# Patient Record
Sex: Male | Born: 1980 | Race: White | Hispanic: No | Smoking: Current some day smoker
Health system: Southern US, Community
[De-identification: ages and names within clinical notes are randomized; demographics above are authoritative.]

## PROBLEM LIST (undated history)

## (undated) DIAGNOSIS — F319 Bipolar disorder, unspecified: Secondary | ICD-10-CM

## (undated) DIAGNOSIS — F32A Depression, unspecified: Secondary | ICD-10-CM

## (undated) DIAGNOSIS — I10 Essential (primary) hypertension: Secondary | ICD-10-CM

## (undated) DIAGNOSIS — F419 Anxiety disorder, unspecified: Secondary | ICD-10-CM

## (undated) DIAGNOSIS — F1010 Alcohol abuse, uncomplicated: Secondary | ICD-10-CM

## (undated) DIAGNOSIS — T7840XA Allergy, unspecified, initial encounter: Secondary | ICD-10-CM

## (undated) DIAGNOSIS — Z91018 Allergy to other foods: Secondary | ICD-10-CM

## (undated) HISTORY — PX: BACK SURGERY: SHX140

## (undated) HISTORY — DX: Allergy, unspecified, initial encounter: T78.40XA

## (undated) HISTORY — DX: Allergy to other foods: Z91.018

## (undated) HISTORY — DX: Depression, unspecified: F32.A

---

## 2004-03-06 ENCOUNTER — Emergency Department: Payer: Self-pay | Admitting: Emergency Medicine

## 2004-05-07 ENCOUNTER — Emergency Department: Payer: Self-pay | Admitting: Emergency Medicine

## 2004-05-12 ENCOUNTER — Emergency Department: Payer: Self-pay | Admitting: Internal Medicine

## 2005-03-20 ENCOUNTER — Emergency Department: Payer: Self-pay | Admitting: Internal Medicine

## 2005-04-06 ENCOUNTER — Emergency Department: Payer: Self-pay | Admitting: General Practice

## 2005-11-08 ENCOUNTER — Ambulatory Visit (HOSPITAL_COMMUNITY): Admission: RE | Admit: 2005-11-08 | Discharge: 2005-11-09 | Payer: Self-pay | Admitting: Neurosurgery

## 2005-11-11 ENCOUNTER — Emergency Department: Payer: Self-pay | Admitting: Emergency Medicine

## 2005-11-15 ENCOUNTER — Emergency Department (HOSPITAL_COMMUNITY): Admission: EM | Admit: 2005-11-15 | Discharge: 2005-11-16 | Payer: Self-pay | Admitting: Emergency Medicine

## 2006-03-25 ENCOUNTER — Emergency Department: Payer: Self-pay | Admitting: Emergency Medicine

## 2006-04-23 HISTORY — PX: BACK SURGERY: SHX140

## 2006-05-20 ENCOUNTER — Emergency Department: Payer: Self-pay | Admitting: Emergency Medicine

## 2006-07-24 ENCOUNTER — Emergency Department: Payer: Self-pay | Admitting: Emergency Medicine

## 2006-11-09 ENCOUNTER — Emergency Department (HOSPITAL_COMMUNITY): Admission: EM | Admit: 2006-11-09 | Discharge: 2006-11-09 | Payer: Self-pay | Admitting: Emergency Medicine

## 2006-11-27 ENCOUNTER — Emergency Department: Payer: Self-pay

## 2006-12-26 ENCOUNTER — Emergency Department: Payer: Self-pay | Admitting: Emergency Medicine

## 2007-07-30 ENCOUNTER — Emergency Department: Payer: Self-pay | Admitting: Emergency Medicine

## 2007-09-25 ENCOUNTER — Emergency Department: Payer: Self-pay | Admitting: Internal Medicine

## 2007-09-27 ENCOUNTER — Emergency Department: Payer: Self-pay | Admitting: Emergency Medicine

## 2007-09-29 ENCOUNTER — Emergency Department: Payer: Self-pay | Admitting: Emergency Medicine

## 2008-09-14 ENCOUNTER — Emergency Department: Payer: Self-pay | Admitting: Emergency Medicine

## 2009-09-01 ENCOUNTER — Emergency Department: Payer: Self-pay | Admitting: Emergency Medicine

## 2009-09-08 ENCOUNTER — Emergency Department: Payer: Self-pay | Admitting: Emergency Medicine

## 2010-05-20 ENCOUNTER — Emergency Department: Payer: Self-pay | Admitting: Emergency Medicine

## 2010-05-31 ENCOUNTER — Emergency Department: Payer: Self-pay | Admitting: Emergency Medicine

## 2010-06-09 ENCOUNTER — Emergency Department: Payer: Self-pay | Admitting: Emergency Medicine

## 2010-07-12 ENCOUNTER — Emergency Department: Payer: Self-pay | Admitting: Internal Medicine

## 2010-07-15 ENCOUNTER — Emergency Department: Payer: Self-pay | Admitting: Emergency Medicine

## 2010-07-17 ENCOUNTER — Emergency Department: Payer: Self-pay | Admitting: Emergency Medicine

## 2010-09-08 NOTE — Op Note (Signed)
NAMESOVEREIGN, RAMIRO            ACCOUNT NO.:  [id number]   MEDICAL RECORD NO.:  [id number]          PATIENT TYPE:  OIB   LOCATION:  3033                         FACILITY:  [facility]   PHYSICIAN:  Reinaldo Meeker, M.D. DATE OF BIRTH:  1980-05-12   DATE OF PROCEDURE:  11/08/2005  DATE OF DISCHARGE:                                 OPERATIVE REPORT   PREOPERATIVE DIAGNOSIS:  Herniated disk, L4-5, left.   POSTOPERATIVE DIAGNOSIS:  Herniated disk, L4-5, left.   PROCEDURE:  Left L4-5 interlaminar laminotomy with excision of herniated  disk with operative microscope.   SECONDARY PROCEDURE:  Microdissection, L4-5 disk and L5 nerve root.   SURGEON:  Dr. Gerlene Fee   ASSISTANT:  Dr. Marikay Alar   PROCEDURE IN DETAIL:  After being placed in the prone position, the  patient's back was shaved, prepped, and draped in the usual sterile fashion.  Localizing x-ray was taken prior to incision to identify the appropriate  level.  Midline incision was made above the spinous processes of L4 and L5.  Using Bovie electrocautery current the incision was carried down the spinous  processes.  Subperiosteal dissection was then carried out along the left  side of the spinous processes and lamina and a self-retaining retractor was  placed for exposure.  X-ray showed approach at the appropriate level.  Using  the high-speed drill, the inferior one-third of the L4 lamina and the medial  one-third of the facet joint were removed.  Drill was then used to removed  the superior one-third of the L5 lamina.  Residual bone and ligamentum  flavum were removed in a piecemeal fashion.  The microscope was prepped,  brought into field, and used for the end of the case.  Using microdissection  technique, the lateral aspect of thecal sac and L5 nerve root were  identified.  Further coagulation was carried out down the front of the canal  to identify the L4-5 disk which was found to be diffusely herniated.  After  coagulating  down to the annulus, the annulus was incised with a #15 blade.  Using pituitary rongeurs and curettes a thorough disk-space clean out was  carried out, while at the same time, a great care was taken to avoid injury  to the nerve neural element, which was successfully done.  At this point,  inspection was carried out in all directions for any evidence of residual  compression and none could be identified.  Large amounts of irrigation were  carried out and any bleeding controlled by procoagulation Gelfoam.  The  wound was then closed in multiple layers of Vicryl on the muscle, fascia,  subcutaneous, and subcuticular tissues and staples were placed on the skin.  A sterile dressing was then applied and the patient was extubated and taken  to the recovery room in stable condition.           ______________________________  Reinaldo Meeker, M.D.     ROK/MEDQ  D:  11/08/2005  T:  11/09/2005  Job:  [id number]

## 2010-10-06 ENCOUNTER — Emergency Department: Payer: Self-pay | Admitting: Internal Medicine

## 2011-08-16 ENCOUNTER — Emergency Department: Payer: Self-pay | Admitting: Internal Medicine

## 2011-08-18 LAB — BETA STREP CULTURE(ARMC)

## 2011-09-26 ENCOUNTER — Emergency Department: Payer: Self-pay | Admitting: Emergency Medicine

## 2012-02-03 ENCOUNTER — Emergency Department: Payer: Self-pay | Admitting: Emergency Medicine

## 2012-02-26 ENCOUNTER — Emergency Department: Payer: Self-pay | Admitting: Emergency Medicine

## 2012-02-26 LAB — COMPREHENSIVE METABOLIC PANEL
Alkaline Phosphatase: 138 U/L — ABNORMAL HIGH (ref 50–136)
Anion Gap: 10 (ref 7–16)
Calcium, Total: 8.6 mg/dL (ref 8.5–10.1)
Co2: 26 mmol/L (ref 21–32)
EGFR (Non-African Amer.): >60
Osmolality: 277 (ref 275–301)
SGOT(AST): 45 U/L — ABNORMAL HIGH (ref 15–37)
Sodium: 139 mmol/L (ref 136–145)

## 2012-02-26 LAB — CBC
HGB: 15.5 g/dL (ref 13.0–18.0)
MCH: 32.2 pg (ref 26.0–34.0)
MCV: 92 fL (ref 80–100)
RBC: 4.82 10*6/uL (ref 4.40–5.90)

## 2012-02-26 LAB — URINALYSIS, COMPLETE
Bilirubin,UR: NEGATIVE
Blood: NEGATIVE
Ketone: NEGATIVE
Ph: 6 (ref 4.5–8.0)
Squamous Epithelial: NONE SEEN

## 2012-02-26 LAB — DRUG SCREEN, URINE
Barbiturates, Ur Screen: NEGATIVE (ref ?–200)
Cocaine Metabolite,Ur ~~LOC~~: NEGATIVE (ref ?–300)
Methadone, Ur Screen: NEGATIVE (ref ?–300)
Opiate, Ur Screen: NEGATIVE (ref ?–300)
Phencyclidine (PCP) Ur S: NEGATIVE (ref ?–25)

## 2012-02-26 LAB — ETHANOL: Ethanol %: 0.142 % — ABNORMAL HIGH (ref 0.000–0.080)

## 2012-03-29 LAB — DRUG SCREEN, URINE
Cannabinoid 50 Ng, Ur ~~LOC~~: NEGATIVE (ref ?–50)
Cocaine Metabolite,Ur ~~LOC~~: NEGATIVE (ref ?–300)
MDMA (Ecstasy)Ur Screen: NEGATIVE (ref ?–500)
Methadone, Ur Screen: NEGATIVE (ref ?–300)
Opiate, Ur Screen: NEGATIVE (ref ?–300)
Tricyclic, Ur Screen: NEGATIVE (ref ?–1000)

## 2012-03-29 LAB — COMPREHENSIVE METABOLIC PANEL
Albumin: 4.4 g/dL (ref 3.4–5.0)
Alkaline Phosphatase: 131 U/L (ref 50–136)
BUN: 10 mg/dL (ref 7–18)
Bilirubin,Total: 0.5 mg/dL (ref 0.2–1.0)
Chloride: 107 mmol/L (ref 98–107)
Creatinine: 0.83 mg/dL (ref 0.60–1.30)
Osmolality: 280 (ref 275–301)
Potassium: 3.8 mmol/L (ref 3.5–5.1)
Sodium: 140 mmol/L (ref 136–145)
Total Protein: 8.4 g/dL — ABNORMAL HIGH (ref 6.4–8.2)

## 2012-03-29 LAB — URINALYSIS, COMPLETE
Bilirubin,UR: NEGATIVE
Blood: NEGATIVE
Glucose,UR: NEGATIVE mg/dL (ref 0–75)
Ketone: NEGATIVE
Ph: 5 (ref 4.5–8.0)
Protein: NEGATIVE
Specific Gravity: 1.01 (ref 1.003–1.030)
Squamous Epithelial: NONE SEEN

## 2012-03-29 LAB — CBC
HGB: 15.5 g/dL (ref 13.0–18.0)
MCH: 30.5 pg (ref 26.0–34.0)
MCHC: 33.9 g/dL (ref 32.0–36.0)
MCV: 90 fL (ref 80–100)
RBC: 5.07 10*6/uL (ref 4.40–5.90)

## 2012-03-30 ENCOUNTER — Inpatient Hospital Stay: Payer: Self-pay | Admitting: Psychiatry

## 2012-08-14 ENCOUNTER — Emergency Department: Payer: Self-pay | Admitting: Emergency Medicine

## 2012-08-14 LAB — COMPREHENSIVE METABOLIC PANEL
Albumin: 4 g/dL (ref 3.4–5.0)
Anion Gap: 5 — ABNORMAL LOW (ref 7–16)
Bilirubin,Total: 0.3 mg/dL (ref 0.2–1.0)
Creatinine: 0.81 mg/dL (ref 0.60–1.30)
Glucose: 85 mg/dL (ref 65–99)
SGOT(AST): 36 U/L (ref 15–37)
Sodium: 142 mmol/L (ref 136–145)
Total Protein: 8.2 g/dL (ref 6.4–8.2)

## 2012-08-14 LAB — DRUG SCREEN, URINE
Cocaine Metabolite,Ur ~~LOC~~: NEGATIVE (ref ?–300)
MDMA (Ecstasy)Ur Screen: NEGATIVE (ref ?–500)
Methadone, Ur Screen: NEGATIVE (ref ?–300)
Opiate, Ur Screen: NEGATIVE (ref ?–300)
Phencyclidine (PCP) Ur S: NEGATIVE (ref ?–25)

## 2012-08-14 LAB — CBC
MCHC: 34.1 g/dL (ref 32.0–36.0)
RBC: 5.07 10*6/uL (ref 4.40–5.90)
RDW: 13.1 % (ref 11.5–14.5)
WBC: 9.5 10*3/uL (ref 3.8–10.6)

## 2012-08-14 LAB — ACETAMINOPHEN LEVEL: Acetaminophen: <2 ug/mL

## 2012-10-06 ENCOUNTER — Emergency Department: Payer: Self-pay | Admitting: Emergency Medicine

## 2012-12-07 ENCOUNTER — Emergency Department: Payer: Self-pay | Admitting: Emergency Medicine

## 2014-08-10 NOTE — Consult Note (Signed)
Brief Consult Note: Diagnosis: alcohol dependence.   Patient was seen by consultant.   Recommend further assessment or treatment.   Orders entered.   Comments: Psychiatry: Patient seen.34 year old man with long history of alcohol dependence and recent binging. He is here voluntarily seeking alcohol detox. Patient is awake, alert, calm and lucid and denies any suicidal ideation. No sign of psychosis. Good insight. Getting ativan for withdrawl and physically stable.  I added a one time dose of ativan 2mg  for him now as he looks a little shakey. NO NEED for IVC paperwork. Arrangements made for admmission to [facility] tomorrow. Sugggest he be DCed from the ER in the morning when he has  a ride to [place].  Electronic Signatures: Audery Amellapacs, Lateefah Mallery T (MD)  (Signed [phone]-Nov-13 18:39)  Authored: Brief Consult Note   Last Updated: 06-Nov-13 18:39 by Audery Amellapacs, Shrey Boike T (MD)

## 2014-08-10 NOTE — H&P (Signed)
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 The patient is single. He is divorced once. He is trying to get remarried to a 35 year old woman. The patient has two kids, one from marriage and one from a relationship, and he lives along with them with his parents who are in their 6850's. All five of them live in a five bedroom house. The patient comes for his first inpatient hospitalization on Psychiatry at [facility] with a chief complaint "I want to quit drinking. I'm tired of drinking".   HISTORY OF PRESENT ILLNESS: The patient reports that he has been drinking at a rate of 4 to 5 fifths of hard liquor along with 18 pack of beer in a span of a week and a half and has been drinking at this rate for the past six weeks or so. The patient is tired of drinking and wants to get help for the same.   PAST PSYCHIATRIC HISTORY: The patient had first inpatient hospitalization to [facility] about two months ago when he went to detox and stayed there for 20 days. Then he came out and stayed sober for three days and was very depressed because he had no job and he can't find a job and so he started drinking again as stated above. He has had one DWI and got his driver's license back. Never arrested for public drunkenness. Has had blackouts on several occasions. Never admitted to any mental hospital. No history of inpatient hospitalization on Psychiatry. No history of suicide attempts. Not being followed by any psychiatrist on an outpatient basis.   FAMILY HISTORY OF MENTAL ILLNESS: None known for mental illness. No history of suicides in the family.   FAMILY HISTORY: Raised by parents. Father is an Nature conservation officeroperations manager . Father is in his 6350's. Mother works at  [employer]. Mother is living and she is in her 2150's. He is an only child. Close to his parents.   PERSONAL HISTORY: [facility]. Dropped out in the 10th grade to go to work. Got GED later. No college. Joined the [employer] soon after coming out of high school. Was in the [employer]. [place] in [place] [employer] [place] Honorable discharge. Was called back for two years [facility]. 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   MARRIAGES: Married once. Marriage ended because she was a Magazine features editorwhore. Has a 27731-year-old kid from the marriage and he has custody of the 27731-year-old son. Has a 35-year-old son from a relationship and the patient has custody of him. Both of them live with the patient along with his parents.   ALCOHOL AND DRUGS: First drink of alcohol was at age 34 years. Became a problem soon after. Started drinking heavily as stated above recently because of feeling depressed about him not being able to get a job and not having a job. Has blackouts as stated above. Does admit smoking THC occasionally and last smoked it two months ago. Denies any IV drugs. Smokes one pack of cigarettes which lasts for 3 or 4 days.   MEDICAL HISTORY: No known high blood pressure. No known diabetes mellitus. Status post back  surgery at L4-L5 many years ago. No history of motor vehicle accident. Never been unconscious.   ALLERGIES: Allergic to Keflex and Mobic.  PRIMARY CARE PHYSICIAN: Not being followed by any physician at this time.   PHYSICAL EXAMINATION:   VITAL SIGNS: Temperature 98.2, pulse 76 per minute and regular, respirations   are...re regular, blood pressure 130/80 mmHg.   HEENT: Normocephalic and atraumatic. Pupils equal, round, and reactive to light and accommodation. Fundi bilaterally benign. EOMS visualized. Tympanic membranes visualized. No exudates.   CHEST: Normal expansion. Normal breath sounds heard.    HEART: Normal S1, S2 without any murmurs or gallops.   ABDOMEN: Soft. Normal bowel sounds heard.   RECTAL: Deferred.   NEUROLOGIC: Gait is normal. Romberg is negative. Cranial nerves II through XII grossly intact. Deep tendon reflexes 2+. Plantars normal response.   MENTAL STATUS EXAMINATION: The patient is dressed in street clothes. Alert and oriented to place, person, and time. Fully aware of situation that brought him for admission to [facility]. Affect is appropriate with his mood which is low and down and depressed about him not having a job and having to get over this habit of drinking alcohol. Admits feeling hopeless and helpless at times. Denies feeling worthless or useless. Denies any suicidal or homicidal plans. Does contract for safety. Wants to get help. No real psychosis. Denies auditory or visual hallucinations. Denies hearing voices or seeing things. Denies paranoid or suspicious ideas. Denies thought insertion or thought control. Denies having any grandiose ideas. Memory is intact. General knowledge and information is fair. He knew the current president and the name of the previous president. He knew capital of [address] and capital of [place]. He could spell the word world forward and backward without any problems. He could count money. Does admit to appetite and sleep disturbance when he's drinking alcohol. Insight and judgment guarded.   IMPRESSION: AXIS I: 1. Alcohol dependence, chronic, continuous with intoxication. 2. THC dependence. 3. Nicotine dependence. 4. Substance-induced mood disorder. 5. Adjustment disorder with depressed mood secondary to losing his job a month ago.  AXIS II: Deferred.   AXIS III: Status post back surgery L4-L5.   AXIS IV: Severe. Occupational, financial, and substance abuse problems.  AXIS V: GAF 25.  PLAN: The patient is admitted to [facility] for close observation, evaluation, and help. He will be started on CIWA  protocol. During the stay in the hospital, he will be given antidepressant medications. He will be taking part in milieu therapy and supportive counseling where substance abuse problems will be addressed.     Social Services will look into appropriate follow-up and substance abuse program if patient is interested.   ____________________________ Jannet Mantis. Guss Bunde, MD skc:drc D: 03/30/2012 18:16:21 ET T: 03/31/2012 05:47:25 ET JOB#: [id number]  cc: Monika Salk K. Guss Bunde, MD, <Dictator> Beau Fanny MD ELECTRONICALLY SIGNED 04/03/2012 7:31

## 2014-08-11 ENCOUNTER — Emergency Department
Admit: 2014-08-11 | Disposition: A | Discharge status: Home / Self Care | Payer: Self-pay | Admitting: Emergency Medicine

## 2015-03-24 ENCOUNTER — Emergency Department
Admission: EM | Admit: 2015-03-24 | Discharge: 2015-03-24 | Disposition: A | Discharge status: Home / Self Care | Payer: Self-pay | Attending: Emergency Medicine | Admitting: Emergency Medicine

## 2015-03-24 ENCOUNTER — Encounter: Payer: Self-pay | Admitting: Emergency Medicine

## 2015-03-24 DIAGNOSIS — F419 Anxiety disorder, unspecified: Secondary | ICD-10-CM | POA: Insufficient documentation

## 2015-03-24 DIAGNOSIS — F17200 Nicotine dependence, unspecified, uncomplicated: Secondary | ICD-10-CM | POA: Insufficient documentation

## 2015-03-24 DIAGNOSIS — X58XXXA Exposure to other specified factors, initial encounter: Secondary | ICD-10-CM | POA: Insufficient documentation

## 2015-03-24 DIAGNOSIS — Y9389 Activity, other specified: Secondary | ICD-10-CM | POA: Insufficient documentation

## 2015-03-24 DIAGNOSIS — T7840XA Allergy, unspecified, initial encounter: Secondary | ICD-10-CM | POA: Insufficient documentation

## 2015-03-24 DIAGNOSIS — Y9289 Other specified places as the place of occurrence of the external cause: Secondary | ICD-10-CM | POA: Insufficient documentation

## 2015-03-24 DIAGNOSIS — Y998 Other external cause status: Secondary | ICD-10-CM | POA: Insufficient documentation

## 2015-03-24 DIAGNOSIS — L500 Allergic urticaria: Secondary | ICD-10-CM | POA: Insufficient documentation

## 2015-03-24 DIAGNOSIS — R112 Nausea with vomiting, unspecified: Secondary | ICD-10-CM | POA: Insufficient documentation

## 2015-03-24 MED ORDER — PREDNISONE 20 MG PO TABS: 40.0000 mg | ORAL_TABLET | Freq: Every day | ORAL | Status: DC

## 2015-03-24 MED ORDER — EPINEPHRINE 0.3 MG/0.3ML IJ SOAJ: 0.3000 mg | Freq: Once | INTRAMUSCULAR | Status: DC

## 2015-03-24 MED ORDER — SODIUM CHLORIDE 0.9 % IV BOLUS (SEPSIS)
1000.0000 mL | Freq: Once | INTRAVENOUS | Status: AC
  Administered 2015-03-24: 1000 mL via INTRAVENOUS

## 2015-03-24 MED ORDER — METHYLPREDNISOLONE SODIUM SUCC 125 MG IJ SOLR
125.0000 mg | Freq: Once | INTRAMUSCULAR | Status: AC
  Administered 2015-03-24: 125 mg via INTRAVENOUS
  Filled 2015-03-24: qty 2

## 2015-03-24 MED ORDER — FAMOTIDINE IN NACL 20-0.9 MG/50ML-% IV SOLN
20.0000 mg | Freq: Once | INTRAVENOUS | Status: AC
  Administered 2015-03-24: 20 mg via INTRAVENOUS
  Filled 2015-03-24: qty 50

## 2015-03-24 MED ORDER — DIPHENHYDRAMINE HCL 50 MG/ML IJ SOLN
50.0000 mg | Freq: Once | INTRAMUSCULAR | Status: AC
  Administered 2015-03-24: 50 mg via INTRAVENOUS
  Filled 2015-03-24: qty 1

## 2015-03-24 MED ORDER — LORAZEPAM 0.5 MG PO TABS: 0.5000 mg | ORAL_TABLET | Freq: Three times a day (TID) | ORAL | Status: AC | PRN

## 2015-03-24 MED ORDER — ACETAMINOPHEN 325 MG PO TABS
ORAL_TABLET | ORAL | Status: AC
  Filled 2015-03-24: qty 2

## 2015-03-24 MED ORDER — ALPRAZOLAM 0.25 MG PO TABS: 0.2500 mg | ORAL_TABLET | Freq: Two times a day (BID) | ORAL | Status: AC | PRN

## 2015-03-24 NOTE — ED Notes (Signed)
Pt reports hives starting at 5:15am from unknown allergen.  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

## 2015-03-24 NOTE — ED Notes (Signed)
Pt states he is feeling better. Itching has decreased. No respiratory issues. Still has pink rash on thighs and bilateral flanks.

## 2015-03-24 NOTE — Discharge Instructions (Signed)
Allergies °An allergy is when your body reacts to a substance in a way that is not normal. An allergic reaction can happen after you: °· Eat something. °· Breathe in something. °· Touch something. °WHAT KINDS OF ALLERGIES ARE THERE? °You can be allergic to: °· Things that are only around during certain seasons, like molds and pollens. °· Foods. °· Drugs. °· Insects. °· Animal dander. °WHAT ARE SYMPTOMS OF ALLERGIES? °· Puffiness (swelling). This may happen on the lips, face, tongue, mouth, or throat. °· Sneezing. °· Coughing. °· Breathing loudly (wheezing). °· Stuffy nose. °· Tingling in the mouth. °· A rash. °· Itching. °· Itchy, red, puffy areas of skin (hives). °· Watery eyes. °· Throwing up (vomiting). °· Watery poop (diarrhea). °· Dizziness. °· Feeling faint or fainting. °· Trouble breathing or swallowing. °· A tight feeling in the chest. °· A fast heartbeat. °HOW ARE ALLERGIES DIAGNOSED? °Allergies can be diagnosed with: °· A medical and family history. °· Skin tests. °· Blood tests. °· A food diary. A food diary is a record of all the foods, drinks, and symptoms you have each day. °· The results of an elimination diet. This diet involves making sure not to eat certain foods and then seeing what happens when you start eating them again. °HOW ARE ALLERGIES TREATED? °There is no cure for allergies, but allergic reactions can be treated with medicine. Severe reactions usually need to be treated at a hospital.  °HOW CAN REACTIONS BE PREVENTED? °The best way to prevent an allergic reaction is to avoid the thing you are allergic to. Allergy shots and medicines can also help prevent reactions in some cases. °  °This information is not intended to replace advice given to you by your health care provider. Make sure you discuss any questions you have with your health care provider. °  °Document Released: 08/04/2012 Document Revised: 04/30/2014 Document Reviewed: 01/19/2014 °Elsevier Interactive Patient Education ©2016  Elsevier Inc. ° °

## 2015-03-24 NOTE — ED Provider Notes (Signed)
  Physical Exam  BP 140/94 mmHg  Pulse 78  Temp(Src) 97.7 F (36.5 C) (Oral)  Resp 17  Ht 5\' 9"  (1.753 m)  Wt 235 lb (106.595 kg)  BMI 34.69 kg/m2  SpO2 98%  Physical Exam  ED Course  Procedures  MDM Care assumed at 7 am. Patient woke up with hives. Not on new meds and no new shampoo or food. Had anaphylaxis to keflex before. Given solumedrol, benadryl, pepcid. Now rash much improved. No airway compromise. Prescription of steroids, epi pen prescribed by Dr. Lenard LancePaduchowski. Patient requests xanax for anxiety. Tachycardia resolved. Reassured patient.   Richardean Canalavid H Kha Hari, MD 03/24/15 [phone]

## 2015-03-24 NOTE — ED Notes (Signed)
Patient ambulatory to triage with steady gait, without difficulty or distress noted; pt reports awoke PTA with itching/hives with no known cause

## 2015-03-24 NOTE — ED Provider Notes (Signed)
[facility] Emergency Department Provider Note  Time seen: 6:09 AM  I have reviewed the triage vital signs and the nursing notes.   HISTORY  Chief Complaint Allergic Reaction    HPI Vernon Allen is a 34 y.o. male with no past medical history who presents the emergency department with an allergic reaction. According to the patient he went to sleep, and awoke with diffuse itching. He states it is gotten much worse over the past 30 minutes. States in the past he had an anaphylactic reaction in which his throat closed up due to a medication. Patient is scared this is happening again. Upon arrival to the emergency department patient had several episodes of vomiting. On exam the patient has diffuse hives, diffuse itching which she describes as severe. Denies any throat or mouth swelling. Denies any difficulty breathing.     History reviewed. No pertinent past medical history.  There are no active problems to display for this patient.   Past Surgical History  Procedure Laterality Date  . Back surgery      No current outpatient prescriptions on file.  Allergies Keflex and Mobic  No family history on file.  Social History Social History  Substance Use Topics  . Smoking status: Current Some Day Smoker  . Smokeless tobacco: None  . Alcohol Use: Yes    Review of Systems Constitutional: Negative for fever. Cardiovascular: Negative for chest pain. Respiratory: Negative for shortness of breath. Gastrointestinal: Negative for abdominal pain. Positive for nausea and vomiting 1. Musculoskeletal: Negative for back pain. Neurological: Negative for headache 10-point ROS otherwise negative.  ____________________________________________   PHYSICAL EXAM:  VITAL SIGNS: ED Triage Vitals  Enc Vitals Group     BP 03/24/15 0551 150/104 mmHg     Pulse Rate 03/24/15 0551 114     Resp 03/24/15 0551 20     Temp 03/24/15 0551 97.7 F (36.5 C)     Temp  Source 03/24/15 0551 Oral     SpO2 03/24/15 0551 97 %     Weight 03/24/15 0551 235 lb (106.595 kg)     Height 03/24/15 0551  (1.753 m)     Head Cir --      Peak Flow --      Pain Score --      Pain Loc --      Pain Edu? --      Excl. in GC? --     Constitutional: Alert and oriented. Mild distress due to anxiety and itching. Eyes: Normal exam ENT   Head: Normocephalic and atraumatic   Mouth/Throat: Mucous membranes are moist. No edema noted. Cardiovascular: Regular rhythm rate around 120 bpm. Respiratory: Normal respiratory effort without tachypnea nor retractions. Breath sounds are clear and equal bilaterally. No wheezes Gastrointestinal: Soft and nontender. No distention.   Musculoskeletal: Nontender with normal range of motion in all extremities Neurologic:  Normal speech and language.  Skin:  Diffuse hives over her entire body, extremities and trunk. Psychiatric: Anxious.  ____________________________________________     INITIAL IMPRESSION / ASSESSMENT AND PLAN / ED COURSE  Pertinent labs & imaging results that were available during my care of the patient were reviewed by me and considered in my medical decision making (see chart for details).  Patient presents for what appears to be a significant allergic reaction. No difficulty breathing, no oral swelling, no wheezes on exam. We will place an IV dose the patient Benadryl, Pepcid, Solu-Medrol, IV fluids, and closely monitor in the emergency  department. I discussed with the patient immediately notify us upon any signs of swelling or difficulty breathing.  Patient appears well, labs appear to be somewhat improved, patient more calm. We'll continue to closely monitor in the emergency department. Patient care signed out to Dr. Silverio LayYao. ____________________________________________   FINAL CLINICAL IMPRESSION(S) / ED DIAGNOSES  Allergic reaction   Minna AntisKevin Allure Greaser, MD 03/24/15 [phone]

## 2015-04-28 ENCOUNTER — Encounter: Payer: Self-pay | Admitting: Emergency Medicine

## 2015-04-28 ENCOUNTER — Emergency Department
Admission: EM | Admit: 2015-04-28 | Discharge: 2015-04-28 | Disposition: A | Discharge status: Home / Self Care | Payer: Self-pay | Attending: Emergency Medicine | Admitting: Emergency Medicine

## 2015-04-28 DIAGNOSIS — L509 Urticaria, unspecified: Secondary | ICD-10-CM

## 2015-04-28 DIAGNOSIS — T7840XA Allergy, unspecified, initial encounter: Secondary | ICD-10-CM | POA: Insufficient documentation

## 2015-04-28 DIAGNOSIS — F419 Anxiety disorder, unspecified: Secondary | ICD-10-CM | POA: Insufficient documentation

## 2015-04-28 DIAGNOSIS — T466X5A Adverse effect of antihyperlipidemic and antiarteriosclerotic drugs, initial encounter: Secondary | ICD-10-CM | POA: Insufficient documentation

## 2015-04-28 DIAGNOSIS — L500 Allergic urticaria: Secondary | ICD-10-CM | POA: Insufficient documentation

## 2015-04-28 DIAGNOSIS — F17210 Nicotine dependence, cigarettes, uncomplicated: Secondary | ICD-10-CM | POA: Insufficient documentation

## 2015-04-28 MED ORDER — PREDNISONE 20 MG PO TABS: ORAL_TABLET | ORAL | Status: DC

## 2015-04-28 MED ORDER — METHYLPREDNISOLONE SODIUM SUCC 125 MG IJ SOLR
125.0000 mg | Freq: Once | INTRAMUSCULAR | Status: AC
  Administered 2015-04-28: 125 mg via INTRAVENOUS
  Filled 2015-04-28: qty 2

## 2015-04-28 MED ORDER — DIPHENHYDRAMINE HCL 50 MG/ML IJ SOLN
25.0000 mg | Freq: Once | INTRAMUSCULAR | Status: AC
  Administered 2015-04-28: 25 mg via INTRAVENOUS
  Filled 2015-04-28: qty 1

## 2015-04-28 MED ORDER — FAMOTIDINE IN NACL 20-0.9 MG/50ML-% IV SOLN
20.0000 mg | Freq: Once | INTRAVENOUS | Status: AC
  Administered 2015-04-28: 20 mg via INTRAVENOUS
  Filled 2015-04-28: qty 50

## 2015-04-28 MED ORDER — EPINEPHRINE 0.3 MG/0.3ML IJ SOAJ: 0.3000 mg | Freq: Once | INTRAMUSCULAR | Status: DC

## 2015-04-28 MED ORDER — FAMOTIDINE 20 MG PO TABS: 20.0000 mg | ORAL_TABLET | Freq: Two times a day (BID) | ORAL | Status: DC

## 2015-04-28 NOTE — ED Provider Notes (Signed)
At 9:30 I checked on the patient is having no symptoms. No hives. No trouble breathing.  Patient states that the anxiety is present significantly*states, however I discussed within the eye and not prescribed and today's pains. 4 anxiety, I have asked him to follow up with [facility].  In terms of his allergic reaction, he was referred to ENT for possible allergy testing, but states he probably will be out of for that.  I've [facility]  He also states he could not afford the $500 EpiPen prescription. I have prescribed the generic epinephrine autoinjector, and printed out a coupon for $100 off from [facility].  We discussed return precautions.  Patient was discharged home.  Governor Rooksebecca Sanjeev Main, MD 04/28/15 [phone]

## 2015-04-28 NOTE — ED Notes (Signed)
Pt resting in bed, resp even and unlabored, in no acute distress

## 2015-04-28 NOTE — Discharge Instructions (Signed)
1. Take the following medicines for the next 4 days: Prednisone 60mg  daily Pepcid 20mg  twice daily 2. Take Benadryl as needed for itching. 3. Return to the ER for worsening symptoms, persistent vomiting, difficulty breathing or other concerns.  Allergies An allergy is an abnormal reaction to a substance by the body's defense system (immune system). Allergies can develop at any age. WHAT CAUSES ALLERGIES? An allergic reaction happens when the immune system mistakenly reacts to a normally harmless substance, called an allergen, as if it were harmful. The immune system releases antibodies to fight the substance. Antibodies eventually release a chemical called histamine into the bloodstream. The release of histamine is meant to protect the body from infection, but it also causes discomfort. An allergic reaction can be triggered by:  Eating an allergen.  Inhaling an allergen.  Touching an allergen. WHAT TYPES OF ALLERGIES ARE THERE? There are many types of allergies. Common types include:  Seasonal allergies. People with this type of allergy are usually allergic to substances that are only present during certain seasons, such as molds and pollens.  Food allergies.  Drug allergies.  Insect allergies.  Animal dander allergies. WHAT ARE SYMPTOMS OF ALLERGIES? Possible allergy symptoms include:  Swelling of the lips, face, tongue, mouth, or throat.  Sneezing, coughing, or wheezing.  Nasal congestion.  Tingling in the mouth.  Rash.  Itching.  Itchy, red, swollen areas of skin (hives).  Watery eyes.  Vomiting.  Diarrhea.  Dizziness.  Lightheadedness.  Fainting.  Trouble breathing or swallowing.  Chest tightness.  Rapid heartbeat. HOW ARE ALLERGIES DIAGNOSED? Allergies are diagnosed with a medical and family history and one or more of the following:  Skin tests.  Blood tests.  A food diary. A food diary is a record of all the foods and drinks you have in a  day and of all the symptoms you experience.  The results of an elimination diet. An elimination diet involves eliminating foods from your diet and then adding them back in one by one to find out if a certain food causes an allergic reaction. HOW ARE ALLERGIES TREATED? There is no cure for allergies, but allergic reactions can be treated with medicine. Severe reactions usually need to be treated at a hospital. HOW CAN REACTIONS BE PREVENTED? The best way to prevent an allergic reaction is by avoiding the substance you are allergic to. Allergy shots and medicines can also help prevent reactions in some cases. People with severe allergic reactions may be able to prevent a life-threatening reaction called anaphylaxis with a medicine given right after exposure to the allergen.   This information is not intended to replace advice given to you by your health care provider. Make sure you discuss any questions you have with your health care provider.   Document Released: 07/03/2002 Document Revised: 04/30/2014 Document Reviewed: 01/19/2014 Elsevier Interactive Patient Education 2016 Elsevier Inc.  Hives Hives are itchy, red, swollen areas of the skin. They can vary in size and location on your body. Hives can come and go for hours or several days (acute hives) or for several weeks (chronic hives). Hives do not spread from person to person (noncontagious). They may get worse with scratching, exercise, and emotional stress. CAUSES   Allergic reaction to food, additives, or drugs.  Infections, including the common cold.  Illness, such as vasculitis, lupus, or thyroid disease.  Exposure to sunlight, heat, or cold.  Exercise.  Stress.  Contact with chemicals. SYMPTOMS   Red or white swollen patches on  the skin. The patches may change size, shape, and location quickly and repeatedly.  Itching.  Swelling of the hands, feet, and face. This may occur if hives develop deeper in the skin. DIAGNOSIS   Your caregiver can usually tell what is wrong by performing a physical exam. Skin or blood tests may also be done to determine the cause of your hives. In some cases, the cause cannot be determined. TREATMENT  Mild cases usually get better with medicines such as antihistamines. Severe cases may require an emergency epinephrine injection. If the cause of your hives is known, treatment includes avoiding that trigger.  HOME CARE INSTRUCTIONS   Avoid causes that trigger your hives.  Take antihistamines as directed by your caregiver to reduce the severity of your hives. Non-sedating or low-sedating antihistamines are usually recommended. Do not drive while taking an antihistamine.  Take any other medicines prescribed for itching as directed by your caregiver.  Wear loose-fitting clothing.  Keep all follow-up appointments as directed by your caregiver. SEEK MEDICAL CARE IF:   You have persistent or severe itching that is not relieved with medicine.  You have painful or swollen joints. SEEK IMMEDIATE MEDICAL CARE IF:   You have a fever.  Your tongue or lips are swollen.  You have trouble breathing or swallowing.  You feel tightness in the throat or chest.  You have abdominal pain. These problems may be the first sign of a life-threatening allergic reaction. Call your local emergency services (911 in [place] MAKE SURE YOU:   Understand these instructions.  Will watch your condition.  Will get help right away if you are not doing well or get worse.   This information is not intended to replace advice given to you by your health care provider. Make sure you discuss any questions you have with your health care provider.   Document Released: 04/09/2005 Document Revised: 04/14/2013 Document Reviewed: 07/03/2011 Elsevier Interactive Patient Education [employer] Elsevier Inc.

## 2015-04-28 NOTE — ED Provider Notes (Signed)
[facility] Emergency Department Provider Note  ____________________________________________  Time seen: Approximately 5:31 AM  I have reviewed the triage vital signs and the nursing notes.   HISTORY  Chief Complaint Allergic Reaction    HPI Vernon Allen is a 35 y.o. male who presents to the ED from home via EMS with a chief complaint of allergic reaction. Patient has a history of similar, most recently approximately 5 weeks ago. Patient states he does not have the money to follow up with an allergist nor to a for an EpiPen. Reports waking up at 4 AM with difficulty breathing and hives on his extremities and trunk. Denies new exposures such as medicines, foods, detergents, soap. Ate acute steak for dinner last evening. Of note, patient also ate red meats last month prior to his allergic reaction. He did have a tick exposure this past summer. Patient denies fever, chills, chest pain, shortness of breath, diaphoresis, nausea, vomiting, diarrhea.Denies recent travel or trauma.   Past medical history None  There are no active problems to display for this patient.   Past Surgical History  Procedure Laterality Date  . Back surgery      Current Outpatient Rx  Name  Route  Sig  Dispense  Refill  . ALPRAZolam (XANAX) 0.25 MG tablet   Oral   Take 1 tablet (0.25 mg total) by mouth 2 (two) times daily as needed for anxiety.   6 tablet   0   . EPINEPHrine (EPIPEN 2-PAK) 0.3 mg/0.3 mL IJ SOAJ injection   Intramuscular   Inject 0.3 mLs (0.3 mg total) into the muscle once.   1 Device   0   . LORazepam (ATIVAN) 0.5 MG tablet   Oral   Take 1 tablet (0.5 mg total) by mouth every 8 (eight) hours as needed for anxiety.   10 tablet   0   . predniSONE (DELTASONE) 20 MG tablet   Oral   Take 2 tablets (40 mg total) by mouth daily.   10 tablet   0     Allergies Keflex and Mobic  No family history on file.  Social History Social History  Substance  Use Topics  . Smoking status: Current Some Day Smoker    Types: Cigarettes  . Smokeless tobacco: None  . Alcohol Use: Yes    Review of Systems Constitutional: No fever/chills Eyes: No visual changes. ENT: No sore throat. Cardiovascular: Denies chest pain. Respiratory: Denies shortness of breath. Gastrointestinal: No abdominal pain.  No nausea, no vomiting.  No diarrhea.  No constipation. Genitourinary: Negative for dysuria. Musculoskeletal: Negative for back pain. Skin: Positive for rash. Neurological: Negative for headaches, focal weakness or numbness.  10-point ROS otherwise negative.  ____________________________________________   PHYSICAL EXAM:  VITAL SIGNS: ED Triage Vitals  Enc Vitals Group     BP --      Pulse --      Resp --      Temp --      Temp src --      SpO2 --      Weight --      Height --      Head Cir --      Peak Flow --      Pain Score --      Pain Loc --      Pain Edu? --      Excl. in GC? --     Constitutional: Alert and oriented. Well appearing and in mild acute distress.  Mildly anxious. Eyes: Conjunctivae are normal. PERRL. EOMI. Head: Atraumatic. Nose: No congestion/rhinnorhea. Mouth/Throat: Mucous membranes are moist.  Oropharynx non-erythematous.  There is no facial, lip or tongue swelling. There is no muffled or hoarse voice. Neck: No stridor.   Cardiovascular: Normal rate, regular rhythm. Grossly normal heart sounds.  Good peripheral circulation. Respiratory: Normal respiratory effort.  No retractions. Lungs CTAB. Gastrointestinal: Soft and nontender. No distention. No abdominal bruits. No CVA tenderness. Musculoskeletal: No lower extremity tenderness nor edema.  No joint effusions. Neurologic:  Normal speech and language. No gross focal neurologic deficits are appreciated. No gait instability. Skin:  Skin is warm, dry and intact. Urticaria noted to extremities and trunk. No petechiae. Psychiatric: Mood and affect are normal. Speech  and behavior are normal.  ____________________________________________   LABS (all labs ordered are listed, but only abnormal results are displayed)  Labs Reviewed - No data to display ____________________________________________  EKG  None ____________________________________________  RADIOLOGY  None ____________________________________________   PROCEDURES  Procedure(s) performed: None  Critical Care performed: No  ____________________________________________   INITIAL IMPRESSION / ASSESSMENT AND PLAN / ED COURSE  Pertinent labs & imaging results that were available during my care of the patient were reviewed by me and considered in my medical decision making (see chart for details).  35 year old male with acute allergic reaction, urticaria. No angioedema or airway distress. Patient took 12.5 mg liquid Benadryl and a Xanax prior to arrival. Will initiate IV allergic reaction cocktail including Solu-Medrol, Benadryl and Pepcid. Will monitor for 3-4 hours.  ----------------------------------------- 7:03 AM on 04/28/2015 -----------------------------------------  Patient resting in no acute distress. Hives improving. Room air saturations 98%. There remains no angioedema. Will proceed with plan to observe for an additional 2 hours. Anticipate discharge home at that time if symptoms continue to improve. Care transferred to Dr. Shaune Pollack pending reassessment. ____________________________________________   FINAL CLINICAL IMPRESSION(S) / ED DIAGNOSES  Final diagnoses:  Allergic reaction, initial encounter  Hives  Anxiety      Vernon Hong, MD 04/28/15 [phone]

## 2015-04-28 NOTE — ED Notes (Addendum)
Pt presents to ED via [facility] from home c/o allergic reaction. Pt states woke up this morning at 4:00 am with difficulty breathing and hives on back and extremities, Pt states has not changed detergents, food, or soap. Pt took 12.5 mg Benadryl at 4:00 am and Xanax at 5:00 am\. Pt reports was seen here in Dec for same symptoms but states has been unable to see allergist, states "I can't afford to see the allergist and I cant afford the Epipen either, so I just keep Benadryl on me." Pt denies trouble breathing at this time, pt denies chest pain, diaphoresis, or other complaints at this time. Pt alert and oriented x 4, respirations even and unlabored, skin warm and dry. MD at bedside.

## 2017-05-24 ENCOUNTER — Other Ambulatory Visit: Payer: Self-pay

## 2017-05-24 ENCOUNTER — Emergency Department
Admission: EM | Admit: 2017-05-24 | Discharge: 2017-05-24 | Disposition: A | Discharge status: Home / Self Care | Payer: Self-pay | Attending: Emergency Medicine | Admitting: Emergency Medicine

## 2017-05-24 ENCOUNTER — Encounter: Payer: Self-pay | Admitting: Emergency Medicine

## 2017-05-24 DIAGNOSIS — F17210 Nicotine dependence, cigarettes, uncomplicated: Secondary | ICD-10-CM | POA: Insufficient documentation

## 2017-05-24 DIAGNOSIS — J209 Acute bronchitis, unspecified: Secondary | ICD-10-CM | POA: Insufficient documentation

## 2017-05-24 MED ORDER — AZITHROMYCIN 250 MG PO TABS: ORAL_TABLET | ORAL | 0 refills | Status: DC

## 2017-05-24 MED ORDER — SERTRALINE HCL 50 MG PO TABS: 50.0000 mg | ORAL_TABLET | Freq: Every day | ORAL | 3 refills | Status: DC

## 2017-05-24 MED ORDER — IPRATROPIUM-ALBUTEROL 0.5-2.5 (3) MG/3ML IN SOLN: 3.0000 mL | Freq: Four times a day (QID) | RESPIRATORY_TRACT | 0 refills | Status: DC | PRN

## 2017-05-24 MED ORDER — IPRATROPIUM-ALBUTEROL 0.5-2.5 (3) MG/3ML IN SOLN
3.0000 mL | Freq: Once | RESPIRATORY_TRACT | Status: AC
  Administered 2017-05-24: 3 mL via RESPIRATORY_TRACT
  Filled 2017-05-24: qty 3

## 2017-05-24 NOTE — BH Assessment (Signed)
Per request of ER MD (Fisher), writer provided the pt. with information and instructions on how to access [facility] ([facility])   Patient denies SI/HI and AV/H.    [facility] [address],  [place], [place] [address] [phone]

## 2017-05-24 NOTE — ED Notes (Signed)
Spoke with TTS, advised of consult order.

## 2017-05-24 NOTE — Discharge Instructions (Signed)
Follow-up with your regular doctor or the acute care if your bronchitis is not better in 3-5 days.  Return to emergency department if you are worsening.  For the anxiety and posttraumatic stress syndrome please follow-up with [facility].  He should call and make an appointment.  You have also been given a prescription for Zoloft to help with your anxiety.  If you are becoming suicidal or homicidal please return to emergency department as soon as possible
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
## 2017-05-24 NOTE — ED Notes (Signed)
TTS to bedside at this time. 

## 2018-07-04 ENCOUNTER — Other Ambulatory Visit: Payer: Self-pay

## 2018-07-04 ENCOUNTER — Emergency Department
Admission: EM | Admit: 2018-07-04 | Discharge: 2018-07-04 | Disposition: A | Discharge status: Home / Self Care | Payer: Self-pay | Attending: Emergency Medicine | Admitting: Emergency Medicine

## 2018-07-04 DIAGNOSIS — R6883 Chills (without fever): Secondary | ICD-10-CM | POA: Insufficient documentation

## 2018-07-04 DIAGNOSIS — R05 Cough: Secondary | ICD-10-CM | POA: Insufficient documentation

## 2018-07-04 DIAGNOSIS — R69 Illness, unspecified: Secondary | ICD-10-CM

## 2018-07-04 DIAGNOSIS — J111 Influenza due to unidentified influenza virus with other respiratory manifestations: Secondary | ICD-10-CM

## 2018-07-04 DIAGNOSIS — R52 Pain, unspecified: Secondary | ICD-10-CM

## 2018-07-04 DIAGNOSIS — F17210 Nicotine dependence, cigarettes, uncomplicated: Secondary | ICD-10-CM | POA: Insufficient documentation

## 2018-07-04 DIAGNOSIS — M791 Myalgia, unspecified site: Secondary | ICD-10-CM | POA: Insufficient documentation

## 2018-07-04 MED ORDER — PSEUDOEPH-BROMPHEN-DM 30-2-10 MG/5ML PO SYRP: 5.0000 mL | ORAL_SOLUTION | Freq: Four times a day (QID) | ORAL | 0 refills | Status: DC | PRN

## 2018-07-04 NOTE — ED Notes (Signed)
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

## 2018-07-04 NOTE — ED Triage Notes (Signed)
Since Wednesday cold chills and body aches. Denies cough. States nasal congestion. A&O, ambulatory. No distress noted.

## 2018-07-04 NOTE — ED Provider Notes (Signed)
[facility] Emergency Department Provider Note   ____________________________________________   First MD Initiated Contact with Patient 07/04/18 0840     (approximate)  I have reviewed the triage vital signs and the nursing notes.   HISTORY  Chief Complaint Generalized Body Aches    HPI Vernon Allen is a 38 y.o. male patient presents with 2 days of body aches.  Patient denies cough.  Patient state nasal congestion and runny nose.  Patient denies nausea, vomiting, diarrhea.  Patient had taken flu shot for this season.  Patient rates his pain as a 2/10.  Patient rates the pain as a 2/10.  Patient described pain is "sore".  No palliative measure for complaint.    History reviewed. No pertinent past medical history.  There are no active problems to display for this patient.   Past Surgical History:  Procedure Laterality Date  . BACK SURGERY      Prior to Admission medications   Medication Sig Start Date End Date Taking? Authorizing Provider  brompheniramine-pseudoephedrine-DM 30-2-10 MG/5ML syrup Take 5 mLs by mouth 4 (four) times daily as needed. 07/04/18   Joni Reining, PA-C  EPINEPHrine 0.3 mg/0.3 mL IJ SOAJ injection Inject 0.3 mLs (0.3 mg total) into the muscle once. 04/28/15   Governor Rooks, MD  ipratropium-albuterol (DUONEB) 0.5-2.5 (3) MG/3ML SOLN Take 3 mLs by nebulization every 6 (six) hours as needed. 05/24/17   Fisher, Roselyn Bering, PA-C  sertraline (ZOLOFT) 50 MG tablet Take 1 tablet (50 mg total) by mouth daily. 05/24/17   Faythe Ghee, PA-C    Allergies Keflex [cephalexin] and Mobic [meloxicam]  History reviewed. No pertinent family history.  Social History Social History   Tobacco Use  . Smoking status: Current Some Day Smoker    Types: Cigarettes  . Smokeless tobacco: Never Used  Substance Use Topics  . Alcohol use: Yes  . Drug use: Yes    Types: Marijuana    Review of Systems Constitutional: No fever/chills.  Body  aches. Eyes: No visual changes. ENT: No sore throat.  Nasal congestion. Cardiovascular: Denies chest pain. Respiratory: Denies shortness of breath. Gastrointestinal: No abdominal pain.  No nausea, no vomiting.  No diarrhea.  No constipation. Genitourinary: Negative for dysuria. Musculoskeletal: Negative for back pain. Skin: Negative for rash. Neurological: Negative for headaches, focal weakness or numbness. Allergic/Immunilogical: Keflex and meloxicam ____________________________________________   PHYSICAL EXAM:  VITAL SIGNS: ED Triage Vitals  Enc Vitals Group     BP 07/04/18 0834 (!) 162/99     Pulse Rate 07/04/18 0834 87     Resp --      Temp 07/04/18 0834 98.6 F (37 C)     Temp Source 07/04/18 0834 Oral     SpO2 07/04/18 0834 97 %     Weight 07/04/18 0835 205 lb (93 kg)     Height 07/04/18 0835 5\' 10"  (1.778 m)     Head Circumference --      Peak Flow --      Pain Score 07/04/18 0836 2     Pain Loc --      Pain Edu? --      Excl. in GC? --    Constitutional: Alert and oriented. Well appearing and in no acute distress. Eyes: Conjunctivae are normal. PERRL. EOMI. Head: Atraumatic. Nose: Edematous nasal turbinates clear rhinorrhea. Mouth/Throat: Mucous membranes are moist.  Oropharynx non-erythematous.  Postnasal drainage. Neck: No stridor.   Hematological/Lymphatic/Immunilogical: No cervical lymphadenopathy. Cardiovascular: Normal rate, regular rhythm.  Grossly normal heart sounds.  Good peripheral circulation.  Elevated blood pressure. Respiratory: Normal respiratory effort.  No retractions. Lungs CTAB. Gastrointestinal: Soft and nontender. No distention. No abdominal bruits. No CVA tenderness. Skin:  Skin is warm, dry and intact. No rash noted. Psychiatric: Mood and affect are normal. Speech and behavior are normal.  ____________________________________________   LABS (all labs ordered are listed, but only abnormal results are displayed)  Labs Reviewed - No  data to display ____________________________________________  EKG   ____________________________________________  RADIOLOGY  ED MD interpretation:    Official radiology report(s): No results found.  ____________________________________________   PROCEDURES  Procedure(s) performed (including Critical Care):  Procedures   ____________________________________________   INITIAL IMPRESSION / ASSESSMENT AND PLAN / ED COURSE  As part of my medical decision making, I reviewed the following data within the electronic MEDICAL RECORD NUMBER         Patient presents with cough, chills, and body aches.  Patient physical exam is consistent with viral flulike illness.  Patient given discharge care instruction advised take medication as directed.  Patient given work note for 2 days.      ____________________________________________   FINAL CLINICAL IMPRESSION(S) / ED DIAGNOSES  Final diagnoses:  Influenza-like illness  Body aches     ED Discharge Orders         Ordered    brompheniramine-pseudoephedrine-DM 30-2-10 MG/5ML syrup  4 times daily PRN     07/04/18 0913           Note:  This document was prepared using Dragon voice recognition software and may include unintentional dictation errors.    Joni Reining, PA-C 07/04/18 1542    Jene Every, MD 07/05/18 [phone]

## 2018-08-28 ENCOUNTER — Emergency Department: Payer: Self-pay

## 2018-08-28 ENCOUNTER — Encounter: Payer: Self-pay | Admitting: Emergency Medicine

## 2018-08-28 ENCOUNTER — Inpatient Hospital Stay
Admission: EM | Admit: 2018-08-28 | Discharge: 2018-09-04 | DRG: 373 | Disposition: A | Discharge status: Home / Self Care | Payer: Self-pay | Attending: Surgery | Admitting: Surgery

## 2018-08-28 ENCOUNTER — Other Ambulatory Visit: Payer: Self-pay

## 2018-08-28 DIAGNOSIS — K3532 Acute appendicitis with perforation and localized peritonitis, without abscess: Secondary | ICD-10-CM | POA: Diagnosis present

## 2018-08-28 DIAGNOSIS — Z881 Allergy status to other antibiotic agents status: Secondary | ICD-10-CM

## 2018-08-28 DIAGNOSIS — R1013 Epigastric pain: Secondary | ICD-10-CM

## 2018-08-28 DIAGNOSIS — K651 Peritoneal abscess: Secondary | ICD-10-CM

## 2018-08-28 DIAGNOSIS — K3533 Acute appendicitis with perforation and localized peritonitis, with abscess: Principal | ICD-10-CM | POA: Diagnosis present

## 2018-08-28 DIAGNOSIS — K3580 Unspecified acute appendicitis: Secondary | ICD-10-CM

## 2018-08-28 DIAGNOSIS — Z888 Allergy status to other drugs, medicaments and biological substances status: Secondary | ICD-10-CM

## 2018-08-28 DIAGNOSIS — Z1159 Encounter for screening for other viral diseases: Secondary | ICD-10-CM

## 2018-08-28 DIAGNOSIS — Z79899 Other long term (current) drug therapy: Secondary | ICD-10-CM

## 2018-08-28 DIAGNOSIS — Z91018 Allergy to other foods: Secondary | ICD-10-CM

## 2018-08-28 DIAGNOSIS — F17210 Nicotine dependence, cigarettes, uncomplicated: Secondary | ICD-10-CM | POA: Diagnosis present

## 2018-08-28 LAB — CBC WITH DIFFERENTIAL/PLATELET
Abs Immature Granulocytes: 0.23 10*3/uL — ABNORMAL HIGH (ref 0.00–0.07)
Basophils Absolute: 0.1 10*3/uL (ref 0.0–0.1)
Basophils Relative: 1 %
Eosinophils Absolute: 0.2 10*3/uL (ref 0.0–0.5)
Eosinophils Relative: 1 %
HCT: 44.8 % (ref 39.0–52.0)
Hemoglobin: 14.9 g/dL (ref 13.0–17.0)
Immature Granulocytes: 1 %
Lymphocytes Relative: 7 %
Lymphs Abs: 1.6 10*3/uL (ref 0.7–4.0)
MCH: 31 pg (ref 26.0–34.0)
MCHC: 33.3 g/dL (ref 30.0–36.0)
MCV: 93.3 fL (ref 80.0–100.0)
Monocytes Absolute: 2.7 10*3/uL — ABNORMAL HIGH (ref 0.1–1.0)
Monocytes Relative: 13 %
Neutro Abs: 16.8 10*3/uL — ABNORMAL HIGH (ref 1.7–7.7)
Neutrophils Relative %: 77 %
Platelets: 347 10*3/uL (ref 150–400)
RBC: 4.8 MIL/uL (ref 4.22–5.81)
RDW: 11.6 % (ref 11.5–15.5)
WBC: 21.7 10*3/uL — ABNORMAL HIGH (ref 4.0–10.5)
nRBC: 0 % (ref 0.0–0.2)

## 2018-08-28 LAB — COMPREHENSIVE METABOLIC PANEL
ALT: 11 U/L (ref 0–44)
AST: 17 U/L (ref 15–41)
Albumin: 3.8 g/dL (ref 3.5–5.0)
Alkaline Phosphatase: 104 U/L (ref 38–126)
Anion gap: 12 (ref 5–15)
BUN: 13 mg/dL (ref 6–20)
CO2: 25 mmol/L (ref 22–32)
Calcium: 9.5 mg/dL (ref 8.9–10.3)
Chloride: 97 mmol/L — ABNORMAL LOW (ref 98–111)
Creatinine, Ser: 0.8 mg/dL (ref 0.61–1.24)
GFR calc Af Amer: >60 mL/min (ref >60)
GFR calc non Af Amer: >60 mL/min (ref >60)
Glucose, Bld: 131 mg/dL — ABNORMAL HIGH (ref 70–99)
Potassium: 3.6 mmol/L (ref 3.5–5.1)
Sodium: 134 mmol/L — ABNORMAL LOW (ref 135–145)
Total Bilirubin: 0.9 mg/dL (ref 0.3–1.2)
Total Protein: 8.8 g/dL — ABNORMAL HIGH (ref 6.5–8.1)

## 2018-08-28 LAB — LIPASE, BLOOD: Lipase: 21 U/L (ref 11–51)

## 2018-08-28 LAB — ABO/RH: ABO/RH(D): O NEG

## 2018-08-28 LAB — SARS CORONAVIRUS 2 BY RT PCR (HOSPITAL ORDER, PERFORMED IN ~~LOC~~ HOSPITAL LAB): SARS Coronavirus 2: NEGATIVE

## 2018-08-28 MED ORDER — CIPROFLOXACIN IN D5W 400 MG/200ML IV SOLN
400.0000 mg | Freq: Two times a day (BID) | INTRAVENOUS | Status: DC
  Filled 2018-08-28 (×2): qty 200

## 2018-08-28 MED ORDER — ONDANSETRON HCL 4 MG/2ML IJ SOLN
4.0000 mg | Freq: Once | INTRAMUSCULAR | Status: AC
  Administered 2018-08-28: 4 mg via INTRAVENOUS
  Filled 2018-08-28: qty 2

## 2018-08-28 MED ORDER — SODIUM CHLORIDE 0.9 % IV SOLN
1.0000 g | Freq: Three times a day (TID) | INTRAVENOUS | Status: DC
  Administered 2018-08-29 – 2018-09-04 (×20): 1 g via INTRAVENOUS
  Filled 2018-08-28 (×22): qty 1

## 2018-08-28 MED ORDER — ONDANSETRON HCL 4 MG/2ML IJ SOLN: 4.0000 mg | Freq: Four times a day (QID) | INTRAMUSCULAR | Status: DC | PRN

## 2018-08-28 MED ORDER — ACETAMINOPHEN 500 MG PO TABS
1000.0000 mg | ORAL_TABLET | Freq: Four times a day (QID) | ORAL | Status: DC
  Administered 2018-08-28 – 2018-08-31 (×10): 1000 mg via ORAL
  Filled 2018-08-28 (×10): qty 2

## 2018-08-28 MED ORDER — METRONIDAZOLE IN NACL 5-0.79 MG/ML-% IV SOLN
500.0000 mg | Freq: Three times a day (TID) | INTRAVENOUS | Status: DC
  Administered 2018-08-28: 500 mg via INTRAVENOUS
  Filled 2018-08-28 (×3): qty 100

## 2018-08-28 MED ORDER — DEXTROSE-NACL 5-0.9 % IV SOLN
INTRAVENOUS | Status: DC
  Administered 2018-08-28 – 2018-08-30 (×6): via INTRAVENOUS

## 2018-08-28 MED ORDER — SODIUM CHLORIDE 0.9 % IV BOLUS
1000.0000 mL | Freq: Once | INTRAVENOUS | Status: AC
  Administered 2018-08-28: 1000 mL via INTRAVENOUS

## 2018-08-28 MED ORDER — MORPHINE SULFATE (PF) 4 MG/ML IV SOLN: 4.0000 mg | INTRAVENOUS | Status: DC | PRN

## 2018-08-28 MED ORDER — IPRATROPIUM-ALBUTEROL 0.5-2.5 (3) MG/3ML IN SOLN: 3.0000 mL | Freq: Four times a day (QID) | RESPIRATORY_TRACT | Status: DC | PRN

## 2018-08-28 MED ORDER — SODIUM CHLORIDE 0.9 % IV BOLUS
500.0000 mL | Freq: Once | INTRAVENOUS | Status: AC
  Administered 2018-08-28: 500 mL via INTRAVENOUS

## 2018-08-28 MED ORDER — HYDRALAZINE HCL 20 MG/ML IJ SOLN: 10.0000 mg | INTRAMUSCULAR | Status: DC | PRN

## 2018-08-28 MED ORDER — ONDANSETRON 4 MG PO TBDP: 4.0000 mg | ORAL_TABLET | Freq: Four times a day (QID) | ORAL | Status: DC | PRN

## 2018-08-28 MED ORDER — SODIUM CHLORIDE 0.9 % IV BOLUS: 500.0000 mL | Freq: Once | INTRAVENOUS | Status: DC

## 2018-08-28 MED ORDER — MORPHINE SULFATE (PF) 4 MG/ML IV SOLN
4.0000 mg | INTRAVENOUS | Status: DC | PRN
  Administered 2018-08-28 – 2018-08-29 (×2): 4 mg via INTRAVENOUS
  Filled 2018-08-28 (×2): qty 1

## 2018-08-28 MED ORDER — CIPROFLOXACIN IN D5W 400 MG/200ML IV SOLN
400.0000 mg | Freq: Once | INTRAVENOUS | Status: AC
  Administered 2018-08-28: 400 mg via INTRAVENOUS
  Filled 2018-08-28: qty 200

## 2018-08-28 MED ORDER — IOHEXOL 300 MG/ML  SOLN
100.0000 mL | Freq: Once | INTRAMUSCULAR | Status: AC | PRN
  Administered 2018-08-28: 100 mL via INTRAVENOUS

## 2018-08-28 MED ORDER — HEPARIN SODIUM (PORCINE) 5000 UNIT/ML IJ SOLN: 5000.0000 [IU] | Freq: Three times a day (TID) | INTRAMUSCULAR | Status: DC

## 2018-08-28 MED ORDER — GENTAMICIN SULFATE 40 MG/ML IJ SOLN
7.0000 mg/kg | INTRAVENOUS | Status: DC
  Administered 2018-08-28: 550 mg via INTRAVENOUS
  Filled 2018-08-28 (×2): qty 13.75

## 2018-08-28 MED ORDER — OXYCODONE HCL 5 MG PO TABS
5.0000 mg | ORAL_TABLET | ORAL | Status: DC | PRN
  Administered 2018-08-28 – 2018-09-04 (×23): 10 mg via ORAL
  Filled 2018-08-28 (×23): qty 2

## 2018-08-28 MED ORDER — DIPHENHYDRAMINE HCL 50 MG/ML IJ SOLN
12.5000 mg | Freq: Once | INTRAMUSCULAR | Status: AC
  Administered 2018-08-28: 12.5 mg via INTRAVENOUS
  Filled 2018-08-28: qty 1

## 2018-08-28 MED ORDER — METRONIDAZOLE IN NACL 5-0.79 MG/ML-% IV SOLN
500.0000 mg | Freq: Once | INTRAVENOUS | Status: AC
  Administered 2018-08-28: 500 mg via INTRAVENOUS
  Filled 2018-08-28: qty 100

## 2018-08-28 MED ORDER — PANTOPRAZOLE SODIUM 40 MG IV SOLR
40.0000 mg | Freq: Every day | INTRAVENOUS | Status: DC
  Administered 2018-08-28 – 2018-09-03 (×7): 40 mg via INTRAVENOUS
  Filled 2018-08-28 (×7): qty 40

## 2018-08-28 NOTE — ED Notes (Signed)
Patient seems relaxed, NAD, no SOB

## 2018-08-28 NOTE — H&P (Signed)
Patient ID: Vernon Allen, male   DOB: Feb 22, 1981, 38 y.o.   MRN: [id number]  HPI Vernon MallingMichael L Heiden is a 38 y.o. male w 5-6 days of abdominal pain. Pain is sharp moderate to severe in intensity and has progressed over the last 48 hrs. He does report subjective fevers and chills. Decrease appetite and N/V. No previous abd operations. HE is able to perform more than 6 METS w/o SOb or C/P. CT pers. Reviewed showing evidence of ruptured appendicitis w phlegmon and inflammatory response cecum, rigth colon and ileum. No free  Air. Wbc 20k, rest of cbc and cmp ok Drinks about 6-8 beers daily. But has not had one in 5 days, no previous DT.   HPI  History reviewed. No pertinent past medical history.  Past Surgical History:  Procedure Laterality Date  . BACK SURGERY      No family history on file.  Social History Social History   Tobacco Use  . Smoking status: Current Some Day Smoker    Types: Cigarettes  . Smokeless tobacco: Never Used  Substance Use Topics  . Alcohol use: Yes  . Drug use: Yes    Types: Marijuana    Allergies  Allergen Reactions  . Keflex [Cephalexin]   . Mobic [Meloxicam]     Current Facility-Administered Medications  Medication Dose Route Frequency Provider Last Rate Last Dose  . ciprofloxacin (CIPRO) IVPB 400 mg  400 mg Intravenous Once Willy Eddyobinson, Patrick, MD 200 mL/hr at 08/28/18 1345 400 mg at 08/28/18 1345   And  . metroNIDAZOLE (FLAGYL) IVPB 500 mg  500 mg Intravenous Once Willy Eddyobinson, Patrick, MD 100 mL/hr at 08/28/18 1346 500 mg at 08/28/18 1346  . ciprofloxacin (CIPRO) IVPB 400 mg  400 mg Intravenous Q12H ,  F, MD       And  . metroNIDAZOLE (FLAGYL) IVPB 500 mg  500 mg Intravenous Q8H ,  F, MD      . gentamicin (GARAMYCIN) 550 mg in dextrose 5 % 100 mL IVPB  7 mg/kg (Adjusted) Intravenous Q24H Willy Eddyobinson, Patrick, MD      . morphine 4 MG/ML injection 4 mg  4 mg Intravenous Q3H PRN Willy Eddyobinson, Patrick, MD      . sodium chloride 0.9 %  bolus 500 mL  500 mL Intravenous Once Willy Eddyobinson, Patrick, MD       Current Outpatient Medications  Medication Sig Dispense Refill  . brompheniramine-pseudoephedrine-DM 30-2-10 MG/5ML syrup Take 5 mLs by mouth 4 (four) times daily as needed. 120 mL 0  . EPINEPHrine 0.3 mg/0.3 mL IJ SOAJ injection Inject 0.3 mLs (0.3 mg total) into the muscle once. 1 Device 1  . ipratropium-albuterol (DUONEB) 0.5-2.5 (3) MG/3ML SOLN Take 3 mLs by nebulization every 6 (six) hours as needed. 360 mL 0  . sertraline (ZOLOFT) 50 MG tablet Take 1 tablet (50 mg total) by mouth daily. 30 tablet 3     Review of Systems Full ROS  was asked and was negative except for the information on the HPI  Physical Exam Blood pressure 128/85, pulse 91, resp. rate (!) 8, height 5\' 9"  (1.753 m), weight 90.7 kg, SpO2 98 %. CONSTITUTIONAL: Awake and alert, non toxic and in NAD. EYES: Pupils are equal, round, and reactive to light, Sclera are non-icteric. EARS, NOSE, MOUTH AND THROAT: The oropharynx is clear. The oral mucosa is pink and moist. Hearing is intact to voice. LYMPH NODES:  Lymph nodes in the neck are normal. RESPIRATORY:  Lungs are clear. There is normal respiratory effort,  with equal breath sounds bilaterally, and without pathologic use of accessory muscles. CARDIOVASCULAR: Heart is regular without murmurs, gallops, or rubs. GI: The abdomen is soft, TTP RUQ and RLQ. No overt peritoneal signs, no rebound. + BS. No previous scars. No masses./ GU: Rectal deferred.   MUSCULOSKELETAL: Normal muscle strength and tone. No cyanosis or edema.   SKIN: Turgor is good and there are no pathologic skin lesions or ulcers. NEUROLOGIC: Motor and sensation is grossly normal. Cranial nerves are grossly intact. PSYCH:  Oriented to person, place and time. Affect is normal.  Data Reviewed  I have personally reviewed the patient's imaging, laboratory findings and medical records.    Assessment/Plan 38 yo male w perforated appendicitis and  Phlegmon. Currently not toxic and no evidence of peritoneal signs. D/W the pt in detail about his disease.. We will admit, resuscitate w crystalloids, check serial abdominal exam and start broad spectrum a/bs. He may need repeat scan in 72 hrs to re-evaluate a possible developing abscess. HE understands that if he deteriorates may require laparotomy and even right colectomy given the significant inflammatory response.  HE understands. No need for emergent surgical intervention at this time. HE will benefit from interval appendectomy in 2-3 months after he recovers from this insult.   Sterling Big, MD FACS General Surgeon 08/28/2018, 2:12 PM

## 2018-08-28 NOTE — ED Triage Notes (Signed)
Pt reports a week ago he stared feeling bloated and having intermittent sharp pain to his mid to upper abd.

## 2018-08-28 NOTE — ED Notes (Signed)
ED TO INPATIENT HANDOFF REPORT  ED Nurse Name and Phone #: Jearld Lesch Name/Age/Gender Vernon Allen 38 y.o. male Room/Bed: ED08A/ED08A  Code Status   Code Status: Not on file  Home/SNF/Other Home Patient oriented to: self, place, time and situation Is this baseline? Yes   Triage Complete: Triage complete  Chief Complaint abd pain nausea vomiting  Triage Note Pt reports a week ago he stared feeling bloated and having intermittent sharp pain to his mid to upper abd.    Allergies Allergies  Allergen Reactions  . Beef-Derived Products Anaphylaxis  . Keflex [Cephalexin] Anaphylaxis  . Mobic [Meloxicam] Anaphylaxis  . Pork-Derived Products Anaphylaxis    Level of Care/Admitting Diagnosis ED Disposition    ED Disposition Condition Comment   Admit  Hospital Area: [facility] [[id number]]  Level of Care: Med-Surg [16]  Covid Evaluation: Screening Protocol (No Symptoms)  Diagnosis: Perforated appendicitis [045409]  Admitting Physician: Leafy Ro [[id number]]  Attending Physician: Leafy Ro [[id number]]  Estimated length of stay: 5 - 7 days  Certification:: I certify this patient will need inpatient services for at least 2 midnights  PT Class (Do Not Modify): Inpatient [101]  PT Acc Code (Do Not Modify): Private [1]       B Medical/Surgery History History reviewed. No pertinent past medical history. Past Surgical History:  Procedure Laterality Date  . BACK SURGERY       A IV Location/Drains/Wounds Patient Lines/Drains/Airways Status   Active Line/Drains/Airways    Name:   Placement date:   Placement time:   Site:   Days:   Peripheral IV 08/28/18 Right Antecubital   08/28/18    1123    Antecubital   less than 1   Peripheral IV 08/28/18 Left Hand   08/28/18    1344    Hand   less than 1          Intake/Output Last 24 hours  Intake/Output Summary (Last 24 hours) at 08/28/2018 1518 Last data filed at 08/28/2018 1327 Gross per 24 hour   Intake 500 ml  Output -  Net 500 ml    Labs/Imaging Results for orders placed or performed during the hospital encounter of 08/28/18 (from the past 48 hour(s))  CBC with Differential/Platelet     Status: Abnormal   Collection Time: 08/28/18 10:52 AM  Result Value Ref Range   WBC 21.7 (H) 4.0 - 10.5 K/uL   RBC 4.80 4.22 - 5.81 MIL/uL   Hemoglobin 14.9 13.0 - 17.0 g/dL   HCT 30.8 65.7 - 84.6 %   MCV 93.3 80.0 - 100.0 fL   MCH 31.0 26.0 - 34.0 pg   MCHC 33.3 30.0 - 36.0 g/dL   RDW 96.2 95.2 - 84.1 %   Platelets 347 150 - 400 K/uL   nRBC 0.0 0.0 - 0.2 %   Neutrophils Relative % 77 %   Neutro Abs 16.8 (H) 1.7 - 7.7 K/uL   Lymphocytes Relative 7 %   Lymphs Abs 1.6 0.7 - 4.0 K/uL   Monocytes Relative 13 %   Monocytes Absolute 2.7 (H) 0.1 - 1.0 K/uL   Eosinophils Relative 1 %   Eosinophils Absolute 0.2 0.0 - 0.5 K/uL   Basophils Relative 1 %   Basophils Absolute 0.1 0.0 - 0.1 K/uL   Immature Granulocytes 1 %   Abs Immature Granulocytes 0.23 (H) 0.00 - 0.07 K/uL    Comment: Performed at [facility], [address], [place], [place] [address]  Comprehensive metabolic panel     Status: Abnormal   Collection Time: 08/28/18 10:52 AM  Result Value Ref Range   Sodium 134 (L) 135 - 145 mmol/L   Potassium 3.6 3.5 - 5.1 mmol/L   Chloride 97 (L) 98 - 111 mmol/L   CO2 25 22 - 32 mmol/L   Glucose, Bld 131 (H) 70 - 99 mg/dL   BUN 13 6 - 20 mg/dL   Creatinine, Ser 6.04 0.61 - 1.24 mg/dL   Calcium 9.5 8.9 - 54.0 mg/dL   Total Protein 8.8 (H) 6.5 - 8.1 g/dL   Albumin 3.8 3.5 - 5.0 g/dL   AST 17 15 - 41 U/L   ALT 11 0 - 44 U/L   Alkaline Phosphatase 104 38 - 126 U/L   Total Bilirubin 0.9 0.3 - 1.2 mg/dL   GFR calc non Af Amer >60 >60 mL/min   GFR calc Af Amer >60 >60 mL/min   Anion gap 12 5 - 15    Comment: Performed at [facility], [address] [place], [place] [address]  Lipase, blood     Status: None   Collection Time: 08/28/18 10:52 AM  Result Value Ref  Range   Lipase 21 11 - 51 U/L    Comment: Performed at [facility], [address] [place], [place] [address]  ABO/Rh     Status: None   Collection Time: 08/28/18  1:28 PM  Result Value Ref Range   ABO/RH(D)      [facility] NEG Performed at [facility], [address], [place], [place] [address]    Ct Abdomen Pelvis W Contrast  Result Date: 08/28/2018 CLINICAL DATA:  38 year old male with right abdominal pain EXAM: CT ABDOMEN AND PELVIS WITH CONTRAST TECHNIQUE: Multidetector CT imaging of the abdomen and pelvis was performed using the standard protocol following bolus administration of intravenous contrast. CONTRAST:  OMNIPAQUE IOHEXOL 300 MG/ML  SOLN COMPARISON:  None. FINDINGS: Lower chest: No acute finding of the lower chest. Hepatobiliary: Unremarkable liver. Unremarkable gallbladder. The previously identified small polyps/possible adenomyosis not visualized. Pancreas: Unremarkable Spleen: Unremarkable Adrenals/Urinary Tract: Unremarkable appearance of the adrenal glands. No evidence of hydronephrosis of the right or left kidney. No nephrolithiasis. Unremarkable course of the bilateral ureters. Urinary bladder is decompressed with questionable circumferential wall thickening. Stomach/Bowel: Unremarkable stomach. The proximal small bowel is relatively unremarkable. There is circumferential mural thickening of the terminal ileum extending to the ileocecal valve, with significant fat at the IC valve. Fat demonstrates inflammatory changes. Significant inflammatory changes/edema in the retrocecal region associated with both terminal ileum and the appendix. Thickening of the fascial layers of the retroperitoneum, including gerota's facia on the right. Inflammatory thickening of the appendix. There is a focal appendicoliths within the lumen of the base of the appendix, with an adjacent appendicoliths which appears lateral and outside the lumen. No focal fluid or abscess. No focal gas.  Mural thickening of the cecum and proximal ascending colon. Diverticula are present. No colonic obstruction. The length of the colon is fluid-filled with high density material in the dependent aspects. Vascular/Lymphatic: No atherosclerosis. Relatively unremarkable vasculature. Unremarkable venous vasculature. Multiple small lymph nodes in the fat adjacent to the cecum and terminal ileum. Multiple lymph nodes within the ileocolic mesentery. Reproductive: Unremarkable prostate. Other: None Musculoskeletal: No displaced fracture. No significant degenerative changes. IMPRESSION: Acute appendicitis, with suspected perforation, as there are 2 appendicoliths, 1 of which appears extraluminal in the adjacent mesenteric fat. Surgical referral recommended. Inflammatory changes adjacent  to the cecum and the appendix compatible with phlegmon/mesenteritis, with no focal abscess. Circumferential wall thickening of the terminal ileum, with significant edematous fat at the ileocecal valve. These may be reactive given the presence of the appendicitis, however, inflammatory bowel disease could have this appearance. Follow-up with gastroenterology may be useful. Multiple lymph nodes adjacent to the cecum and within the ileocolic mesentery, presumably reactive. These results were discussed by telephone at the time of interpretation on 08/28/2018 at 1:09 pm with Dr. Willy EddyPATRICK ROBINSON. Circumferential urinary bladder wall thickening, may simply be related to decompressed urinary bladder, however, correlation with any prior symptoms of cystitis/chronic cystitis may be useful. Electronically Signed   By: Gilmer MorJaime  Wagner D.O.   On: 08/28/2018 13:10   [place] Abdomen Limited Ruq  Result Date: 08/28/2018 CLINICAL DATA:  38 year old male with right upper quadrant and acute epigastric pain EXAM: ULTRASOUND ABDOMEN LIMITED RIGHT UPPER QUADRANT COMPARISON:  None. FINDINGS: Gallbladder: Negative sonographic Murphy's sign. No gallbladder wall thickening  or pericholecystic fluid. There are small echogenic foci associated with the gallbladder wall measuring 5 mm-6 mm. These are anti dependent. No ring down artifact. No echogenic shadowing foci. Common bile duct: Diameter: 4 mm Liver: Heterogeneous appearance of the liver echotexture. Portal vein is patent on color Doppler imaging with normal direction of blood flow towards the liver. IMPRESSION: No acute finding of the upper abdomen. Gallbladder demonstrates either small polyps or possibly adenomyomatosis. Twelve month follow-up ultrasound is indicated. Heterogeneous appearance of the liver, potentially steatosis Electronically Signed   By: Gilmer MorJaime  Wagner D.O.   On: 08/28/2018 12:07    Pending Labs Unresulted Labs (From admission, onward)    Start     Ordered   08/28/18 1316  SARS Coronavirus 2 [facility](H[facility] order, Per[facility])  (Novel Coronavirus, [facility](Hospital Order))  ONCE - STAT,   STAT    Question:  Patient immune status  Answer:  Normal   08/28/18 1316   08/28/18 1316  HIV antibody (Routine Testing)  Once,   STAT     08/28/18 1316   Signed and Held  CBC  (heparin)  Once,   R    Comments:  Baseline for heparin therapy IF NOT ALREADY DRAWN.  Notify MD if PLT < 100 K.    Signed and Held   Signed and Held  Creatinine, serum  (heparin)  Once,   R    Comments:  Baseline for heparin therapy IF NOT ALREADY DRAWN.    Signed and Held   Signed and Held  CBC  Tomorrow morning,   R     Signed and Held   Signed and Held  Magnesium  Tomorrow morning,   R     Signed and Held   Signed and Held  Comprehensive metabolic panel  Tomorrow morning,   R     Signed and Held          Vitals/Pain Today's Vitals   08/28/18 1400 08/28/18 1415 08/28/18 1430 08/28/18 1445  BP: 137/88  (!) 138/91   Pulse: 90 88 90 86  Resp: 18 11 16  (!) 21  SpO2: 98% 97% 99% 99%  Weight:      Height:      PainSc:        Isolation Precautions Droplet and Contact  precautions  Medications Medications  morphine 4 MG/ML injection 4 mg (has no administration in time range)  sodium chloride 0.9 % bolus 500 mL (has no administration in time range)  gentamicin (  GARAMYCIN) 550 mg in dextrose 5 % 100 mL IVPB (550 mg Intravenous New Bag/Given 08/28/18 1513)  ciprofloxacin (CIPRO) IVPB 400 mg (has no administration in time range)    And  metroNIDAZOLE (FLAGYL) IVPB 500 mg (has no administration in time range)  sodium chloride 0.9 % bolus 500 mL (0 mLs Intravenous Stopped 08/28/18 1327)  ondansetron (ZOFRAN) injection 4 mg (4 mg Intravenous Given 08/28/18 1221)  iohexol (OMNIPAQUE) 300 MG/ML solution 100 mL (100 mLs Intravenous Contrast Given 08/28/18 1240)  ciprofloxacin (CIPRO) IVPB 400 mg (400 mg Intravenous New Bag/Given 08/28/18 1345)    And  metroNIDAZOLE (FLAGYL) IVPB 500 mg (500 mg Intravenous New Bag/Given 08/28/18 1346)  sodium chloride 0.9 % bolus 1,000 mL (1,000 mLs Intravenous New Bag/Given 08/28/18 1349)  diphenhydrAMINE (BENADRYL) injection 12.5 mg (12.5 mg Intravenous Given 08/28/18 1449)    Mobility walks Low fall risk   Focused Assessments 1   R Recommendations: See Admitting Provider Note  Report given to:   Additional Notes:

## 2018-08-28 NOTE — Progress Notes (Signed)
Pharmacy Antibiotic Note  Vernon Allen is a 38 y.o. male admitted on 08/28/2018 with IAI/perforated appendicitis.  Pharmacy has been consulted for Gentamicin dosing.  Plan: Wt 90.7 kg  AdjBW 78.7 kg  (28% over IBW) Scr 0.80  Crcl 139 Will order Gentamicin 7 mg/kg (550 mg) Q24h- based on AdjBW Will check random Gentamicin level 8-10 hours after dose to assess for continued dosing interval for extended interval aminoglycoside dosing.    Height: 5\' 9"  (175.3 cm) Weight: 200 lb (90.7 kg) IBW/kg (Calculated) : 70.7  No data recorded.  Recent Labs  Lab 08/28/18 1052  WBC 21.7*  CREATININE 0.80    Estimated Creatinine Clearance: 139.4 mL/min (by C-G formula based on SCr of 0.8 mg/dL).    Allergies  Allergen Reactions  . Keflex [Cephalexin]   . Mobic [Meloxicam]     Antimicrobials this admission: Cipro 5/7 >>   Metronidazole 5/7 >>   Gentamicin 5/7 >>  Dose adjustments this admission:    Microbiology results:   BCx:     UCx:      Sputum:      MRSA PCR:   5/7 Covid pending  Thank you for allowing pharmacy to be a part of this patient's care.  Yukiko Minnich A 08/28/2018 1:58 PM

## 2018-08-28 NOTE — ED Notes (Signed)
Pt reports he does not want to take morphine at this time, c/o pain 4/10. Would like to take zofran only.

## 2018-08-28 NOTE — ED Notes (Signed)
Pt speaking with this nurse regarding symptoms, A&Ox4. No signs of acute distress at this time

## 2018-08-28 NOTE — Progress Notes (Signed)
Pt informed primary nurse that down stairs in ED that pt had an allergic reaction to the Cipro. Primary nurse paged and spoke to Dr. Everlene Farrier. Orders received for IV cefepime. When primary RN entered  Orders for Cefepime it stated that pt had an anaphylaxis warning. Primary RN paged and spoke to Dr. Everlene Farrier and received orders to consult pharmacy in regard to the multiple allergies and needing IV antibiotics. Primary nurse called and spoke to Pharmacist  Onalee Hua to assist with IV antibiotics.

## 2018-08-28 NOTE — ED Notes (Signed)
Report given to [facility] on [facility]

## 2018-08-28 NOTE — ED Provider Notes (Signed)
[facility] Emergency Department Provider Note    First MD Initiated Contact with Patient 08/28/18 1031     (approximate)  I have reviewed the triage vital signs and the nursing notes.   HISTORY  Chief Complaint Abdominal Pain and Emesis    HPI Vernon Allen is a 38 y.o. male no significant past medical history presents the ER for nausea epigastric and right upper quadrant abdominal pain starting over 24 hours ago became more severe this morning.  Denies any fevers.  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  Denies any back pain.  Pain is reproduced with movement but has not been able to eat much over the past 2 days due to worsening nausea.  Pain is worsened after eating.  Denies any sick contacts.  No dysuria.  Has never had pain like this before.    History reviewed. No pertinent past medical history. No family history on file. Past Surgical History:  Procedure Laterality Date   BACK SURGERY     There are no active problems to display for this patient.     Prior to Admission medications   Medication Sig Start Date End Date Taking? Authorizing Provider  brompheniramine-pseudoephedrine-DM 30-2-10 MG/5ML syrup Take 5 mLs by mouth 4 (four) times daily as needed. 07/04/18   Joni Reining, PA-C  EPINEPHrine 0.3 mg/0.3 mL IJ SOAJ injection Inject 0.3 mLs (0.3 mg total) into the muscle once. 04/28/15   Governor Rooks, MD  ipratropium-albuterol (DUONEB) 0.5-2.5 (3) MG/3ML SOLN Take 3 mLs by nebulization every 6 (six) hours as needed. 05/24/17   Fisher, Roselyn Bering, PA-C  sertraline (ZOLOFT) 50 MG tablet Take 1 tablet (50 mg total) by mouth daily. 05/24/17   Faythe Ghee, PA-C    Allergies Keflex [cephalexin] and Mobic [meloxicam]    Social History Social History   Tobacco Use   Smoking status: Current Some Day Smoker    Types: Cigarettes   Smokeless tobacco: Never Used  Substance Use  Topics   Alcohol use: Yes   Drug use: Yes    Types: Marijuana    Review of Systems Patient denies headaches, rhinorrhea, blurry vision, numbness, shortness of breath, chest pain, edema, cough, abdominal pain, nausea, vomiting, diarrhea, dysuria, fevers, rashes or hallucinations unless otherwise stated above in HPI. ____________________________________________   PHYSICAL EXAM:  VITAL SIGNS: There were no vitals filed for this visit.  Constitutional: Alert and oriented.  Eyes: Conjunctivae are normal.  Head: Atraumatic. Nose: No congestion/rhinnorhea. Mouth/Throat: Mucous membranes are moist.   Neck: No stridor. Painless ROM.  Cardiovascular: Normal rate, regular rhythm. Grossly normal heart sounds.  Good peripheral circulation. Respiratory: Normal respiratory effort.  No retractions. Lungs CTAB. Gastrointestinal: Soft with ttp in ruq.Marland Kitchen No distention. No abdominal bruits. No CVA tenderness. Genitourinary:  Musculoskeletal: No lower extremity tenderness nor edema.  No joint effusions. Neurologic:  Normal speech and language. No gross focal neurologic deficits are appreciated. No facial droop Skin:  Skin is warm, dry and intact. No rash noted. Psychiatric: Mood and affect are normal. Speech and behavior are normal.  ____________________________________________   LABS (all labs ordered are listed, but only abnormal results are displayed)  No results found for this or any previous visit (from the past 24 hour(s)). ____________________________________________  EKG My review and personal interpretation at Time: 10:44   Indication: abd pain  Rate: 100  Rhythm: sinus Axis: normal Other: poor r wave progression,  no stemi ____________________________________________  RADIOLOGY I personally reviewed all radiographic images ordered to evaluate for the above acute complaints and reviewed radiology reports and findings.  These findings were personally discussed with the patient.   Please see medical record for radiology report.  ____________________________________________   PROCEDURES  Procedure(s) performed:  .Critical Care Performed by: Willy Eddyobinson, Abagael Kramm, MD Authorized by: Willy Eddyobinson, Nickson Middlesworth, MD   Critical care provider statement:    Critical care time (minutes):  30   Critical care time was exclusive of:  Separately billable procedures and treating other patients   Critical care was necessary to treat or prevent imminent or life-threatening deterioration of the following conditions:  Sepsis   Critical care was time spent personally by me on the following activities:  Development of treatment plan with patient or surrogate, discussions with consultants, evaluation of patient's response to treatment, examination of patient, obtaining history from patient or surrogate, ordering and performing treatments and interventions, ordering and review of laboratory studies, ordering and review of radiographic studies, pulse oximetry, re-evaluation of patient's condition and review of old charts      Critical Care performed: yes ____________________________________________   INITIAL IMPRESSION / ASSESSMENT AND PLAN / ED COURSE  Pertinent labs & imaging results that were available during my care of the patient were reviewed by me and considered in my medical decision making (see chart for details).   DDX: Appendicitis, colitis, cholelithiasis, cholecystitis, IBD, pancreatitis  Vernon Allen is a 38 y.o. who presents to the ED with symptoms as described above.  Patient afebrile and hemodynamically stable but is quite tender on exam.  Blood will be sent for the by differential.  Will provide IV fluids as well as IV pain medication IV antiemetic. RUQ us imaging will be ordered for the by differential.  Clinical Course as of Aug 28 1315  Thu Aug 28, 2018  1211 Patient with a white count of 20,000 with equivocal ultrasound.  Will order CT imaging to further  characterize.   [PR]  1313 Patient with evidence of probable ruptured acute appendicitis.  Case discussed with radiology.  Will consult surgery.  Will give antibiotics.  Have discussed with the patient and available family all diagnostics and treatments performed thus far and all questions were answered to the best of my ability. The patient demonstrates understanding and agreement with plan.    [PR]    Clinical Course User Index [PR] Willy Eddyobinson, Niasha Devins, MD   The patient was evaluated in Emergency Department today for the symptoms described in the history of present illness. He/she was evaluated in the context of the global COVID-19 pandemic, which necessitated consideration that the patient might be at risk for infection with the SARS-CoV-2 virus that causes COVID-19. Institutional protocols and algorithms that pertain to the evaluation of patients at risk for COVID-19 are in a state of rapid change based on information released by regulatory bodies including the CDC and federal and state organizations. These policies and algorithms were followed during the patient's care in the ED.   As part of my medical decision making, I reviewed the following data within the electronic MEDICAL RECORD NUMBER Nursing notes reviewed and incorporated, Labs reviewed, notes from prior ED visits and [place] Controlled Substance Database   ____________________________________________   FINAL CLINICAL IMPRESSION(S) / ED DIAGNOSES  Final diagnoses:  Acute epigastric pain  Acute appendicitis, unspecified acute appendicitis type      NEW MEDICATIONS STARTED DURING THIS VISIT:  New Prescriptions   No medications on file  Note:  This document was prepared using Dragon voice recognition software and may include unintentional dictation errors.    Willy Eddy, MD 08/28/18 1319

## 2018-08-29 DIAGNOSIS — K3580 Unspecified acute appendicitis: Secondary | ICD-10-CM

## 2018-08-29 LAB — CBC
HCT: 38.4 % — ABNORMAL LOW (ref 39.0–52.0)
Hemoglobin: 12.8 g/dL — ABNORMAL LOW (ref 13.0–17.0)
MCH: 31.2 pg (ref 26.0–34.0)
MCHC: 33.3 g/dL (ref 30.0–36.0)
MCV: 93.7 fL (ref 80.0–100.0)
Platelets: 315 10*3/uL (ref 150–400)
RBC: 4.1 MIL/uL — ABNORMAL LOW (ref 4.22–5.81)
RDW: 11.9 % (ref 11.5–15.5)
WBC: 21.8 10*3/uL — ABNORMAL HIGH (ref 4.0–10.5)
nRBC: 0 % (ref 0.0–0.2)

## 2018-08-29 LAB — COMPREHENSIVE METABOLIC PANEL
ALT: 7 U/L (ref 0–44)
AST: 11 U/L — ABNORMAL LOW (ref 15–41)
Albumin: 3.1 g/dL — ABNORMAL LOW (ref 3.5–5.0)
Alkaline Phosphatase: 91 U/L (ref 38–126)
Anion gap: 10 (ref 5–15)
BUN: 11 mg/dL (ref 6–20)
CO2: 24 mmol/L (ref 22–32)
Calcium: 8.5 mg/dL — ABNORMAL LOW (ref 8.9–10.3)
Chloride: 105 mmol/L (ref 98–111)
Creatinine, Ser: 0.73 mg/dL (ref 0.61–1.24)
GFR calc Af Amer: >60 mL/min (ref >60)
GFR calc non Af Amer: >60 mL/min (ref >60)
Glucose, Bld: 109 mg/dL — ABNORMAL HIGH (ref 70–99)
Potassium: 3.5 mmol/L (ref 3.5–5.1)
Sodium: 139 mmol/L (ref 135–145)
Total Bilirubin: 0.8 mg/dL (ref 0.3–1.2)
Total Protein: 7.2 g/dL (ref 6.5–8.1)

## 2018-08-29 LAB — GLUCOSE, CAPILLARY: Glucose-Capillary: 101 mg/dL — ABNORMAL HIGH (ref 70–99)

## 2018-08-29 LAB — GENTAMICIN LEVEL, RANDOM: Gentamicin Rm: 0.8 ug/mL

## 2018-08-29 LAB — MAGNESIUM: Magnesium: 2.2 mg/dL (ref 1.7–2.4)

## 2018-08-29 LAB — HIV ANTIBODY (ROUTINE TESTING W REFLEX): HIV Screen 4th Generation wRfx: NONREACTIVE

## 2018-08-29 NOTE — Progress Notes (Signed)
Pharmacy Antibiotic Note  Vernon Allen is a 38 y.o. male admitted on 08/28/2018 with IAI/perforated appendicitis.  Pharmacy has been consulted for Gentamicin dosing.  Plan: 05/08 @ 0130 Natasha Bence random level 10.5 hours post dose: 0.8 mcg/mL. Appropriate per hartford nomogram. Will continue current regimen and will check a trough 05/12 @ 1300 prior to 5th dose to ensure trough levels < 1 - 2 mcg/mL or undetectable. Renal function is stable will continue to monitor.   Height: 5\' 9"  (175.3 cm) Weight: 200 lb (90.7 kg) IBW/kg (Calculated) : 70.7  Temp (24hrs), Avg:99.3 F (37.4 C), Min:98.2 F (36.8 C), Max:100.3 F (37.9 C)  Recent Labs  Lab 08/28/18 1052 08/29/18 0135  WBC 21.7* 21.8*  CREATININE 0.80 0.73  GENTRANDOM  --  0.8    Estimated Creatinine Clearance: 139.4 mL/min (by C-G formula based on SCr of 0.73 mg/dL).    Allergies  Allergen Reactions  . Beef-Derived Products Anaphylaxis  . Keflex [Cephalexin] Anaphylaxis  . Mobic [Meloxicam] Anaphylaxis  . Pork-Derived Products Anaphylaxis    Antimicrobials this admission: Cipro 5/7 >>   Metronidazole 5/7 >>   Gentamicin 5/7 >>  Dose adjustments this admission:    Microbiology results:   BCx:     UCx:      Sputum:      MRSA PCR:   5/7 Covid pending  Thank you for allowing pharmacy to be a part of this patient's care.  Thomasene Ripple, PharmD, BCPS Clinical Pharmacist 08/29/2018

## 2018-08-29 NOTE — Progress Notes (Signed)
08/29/2018  Subjective: No acute events overnight.  Patient admitted yesterday with perforated appendicitis with large phlegmon and inflammation of the right colon.  Had issues yesterday with question of allergies to antibiotics and was eventually changed to meropenem and gentamicin by pharmacy.  Reports the pain is doing better and denies any fevers chills or worsening pain.  Vital signs: Temp:  [98.2 F (36.8 C)-100.3 F (37.9 C)] 98.6 F (37 C) (05/08 1344) Pulse Rate:  [78-94] 78 (05/08 1344) Resp:  [11-21] 18 (05/08 1344) BP: (118-138)/(72-96) 118/88 (05/08 1344) SpO2:  [97 %-100 %] 98 % (05/08 1344)   Intake/Output: 05/07 0701 - 05/08 0700 In: 500 [IV Piggyback:500] Out: -  Last BM Date: 08/28/18  Physical Exam: Constitutional: No acute distress Abdomen: Soft, nondistended, with tenderness to palpation in the right lower quadrant but not peritoneal.  Labs:  Recent Labs    08/28/18 1052 08/29/18 0135  WBC 21.7* 21.8*  HGB 14.9 12.8*  HCT 44.8 38.4*  PLT 347 315   Recent Labs    08/28/18 1052 08/29/18 0135  NA 134* 139  K 3.6 3.5  CL 97* 105  CO2 25 24  GLUCOSE 131* 109*  BUN 13 11  CREATININE 0.80 0.73  CALCIUM 9.5 8.5*   No results for input(s): LABPROT, INR in the last 72 hours.  Imaging: No results found.  Assessment/Plan: This is a 38 y.o. male with perforated appendicitis with large phlegmon.  - Continue n.p.o. today with IV fluid hydration. - Continue IV antibiotics per pharmacy including meropenem and gentamicin. - At this point will attempt conservative management.  However the patient does understand that if there is no improvement or any worsening, he may need either repeat CT scan versus surgery.  If he does require surgery, given the significant phlegmon inflammation, the patient does know that he may require a right colectomy.   Vernon Ill, MD [facility]

## 2018-08-30 LAB — BASIC METABOLIC PANEL
Anion gap: 8 (ref 5–15)
BUN: 6 mg/dL (ref 6–20)
CO2: 24 mmol/L (ref 22–32)
Calcium: 8.5 mg/dL — ABNORMAL LOW (ref 8.9–10.3)
Chloride: 108 mmol/L (ref 98–111)
Creatinine, Ser: 0.61 mg/dL (ref 0.61–1.24)
GFR calc Af Amer: >60 mL/min (ref >60)
GFR calc non Af Amer: >60 mL/min (ref >60)
Glucose, Bld: 115 mg/dL — ABNORMAL HIGH (ref 70–99)
Potassium: 3.3 mmol/L — ABNORMAL LOW (ref 3.5–5.1)
Sodium: 140 mmol/L (ref 135–145)

## 2018-08-30 LAB — CBC WITH DIFFERENTIAL/PLATELET
Abs Immature Granulocytes: 0.33 10*3/uL — ABNORMAL HIGH (ref 0.00–0.07)
Basophils Absolute: 0.1 10*3/uL (ref 0.0–0.1)
Basophils Relative: 1 %
Eosinophils Absolute: 0.3 10*3/uL (ref 0.0–0.5)
Eosinophils Relative: 1 %
HCT: 37.6 % — ABNORMAL LOW (ref 39.0–52.0)
Hemoglobin: 12 g/dL — ABNORMAL LOW (ref 13.0–17.0)
Immature Granulocytes: 2 %
Lymphocytes Relative: 11 %
Lymphs Abs: 2.1 10*3/uL (ref 0.7–4.0)
MCH: 30.6 pg (ref 26.0–34.0)
MCHC: 31.9 g/dL (ref 30.0–36.0)
MCV: 95.9 fL (ref 80.0–100.0)
Monocytes Absolute: 2.3 10*3/uL — ABNORMAL HIGH (ref 0.1–1.0)
Monocytes Relative: 12 %
Neutro Abs: 14.6 10*3/uL — ABNORMAL HIGH (ref 1.7–7.7)
Neutrophils Relative %: 73 %
Platelets: 350 10*3/uL (ref 150–400)
RBC: 3.92 MIL/uL — ABNORMAL LOW (ref 4.22–5.81)
RDW: 11.9 % (ref 11.5–15.5)
WBC: 19.7 10*3/uL — ABNORMAL HIGH (ref 4.0–10.5)
nRBC: 0 % (ref 0.0–0.2)

## 2018-08-30 LAB — MAGNESIUM: Magnesium: 1.9 mg/dL (ref 1.7–2.4)

## 2018-08-30 MED ORDER — POTASSIUM CHLORIDE CRYS ER 20 MEQ PO TBCR
20.0000 meq | EXTENDED_RELEASE_TABLET | ORAL | Status: AC
  Administered 2018-08-30 (×2): 20 meq via ORAL
  Filled 2018-08-30 (×2): qty 1

## 2018-08-30 NOTE — Progress Notes (Addendum)
Subjective:  CC: Vernon Allen is a 38 y.o. male  Hospital stay day 2,   Perforated appendicitis  HPI: No acute issues overnight.  Patient states pain has continued to improve.  ROS:  General: Denies weight loss, weight gain, fatigue, fevers, chills, and night sweats. Heart: Denies chest pain, palpitations, racing heart, irregular heartbeat, leg pain or swelling, and decreased activity tolerance. Respiratory: Denies breathing difficulty, shortness of breath, wheezing, cough, and sputum. GI: Denies change in appetite, heartburn, nausea, vomiting, constipation, diarrhea, and blood in stool. GU: Denies difficulty urinating, pain with urinating, urgency, frequency, blood in urine.   Objective:   Temp:  [98.8 F (37.1 C)-99.2 F (37.3 C)] 98.8 F (37.1 C) (05/09 1407) Pulse Rate:  [86-97] 87 (05/09 1407) Resp:  [16-18] 18 (05/09 1407) BP: (125-129)/(77-87) 125/77 (05/09 1407) SpO2:  [97 %-100 %] 100 % (05/09 1407)     Height: 5\' 9"  (175.3 cm) Weight: 90.7 kg BMI (Calculated): 29.52   Intake/Output this shift:   Intake/Output Summary (Last 24 hours) at 08/30/2018 1421 Last data filed at 08/30/2018 1300 Gross per 24 hour  Intake 4358.37 ml  Output 0 ml  Net 4358.37 ml    Constitutional :  alert, cooperative, appears stated age and no distress  Respiratory:  clear to auscultation bilaterally  Cardiovascular:  regular rate and rhythm  Gastrointestinal: Soft, no guarding, minimal tenderness in right lower quadrant area..   Skin: Cool and moist.   Psychiatric: Normal affect, non-agitated, not confused       LABS:  CMP Latest Ref Rng & Units 08/30/2018 08/29/2018 08/28/2018  Glucose 70 - 99 mg/dL 161(W) 960(A) 540(J)  BUN 6 - 20 mg/dL 6 11 13   Creatinine 0.61 - 1.24 mg/dL 8.11 9.14 7.82  Sodium 135 - 145 mmol/L 140 139 134(L)  Potassium 3.5 - 5.1 mmol/L 3.3(L) 3.5 3.6  Chloride 98 - 111 mmol/L 108 105 97(L)  CO2 22 - 32 mmol/L 24 24 25   Calcium 8.9 - 10.3 mg/dL 9.5(A) 2.1(H) 9.5   Total Protein 6.5 - 8.1 g/dL - 7.2 0.8(M)  Total Bilirubin 0.3 - 1.2 mg/dL - 0.8 0.9  Alkaline Phos 38 - 126 U/L - 91 104  AST 15 - 41 U/L - 11(L) 17  ALT 0 - 44 U/L - 7 11   CBC Latest Ref Rng & Units 08/30/2018 08/29/2018 08/28/2018  WBC 4.0 - 10.5 K/uL 19.7(H) 21.8(H) 21.7(H)  Hemoglobin 13.0 - 17.0 g/dL 12.0(L) 12.8(L) 14.9  Hematocrit 39.0 - 52.0 % 37.6(L) 38.4(L) 44.8  Platelets 150 - 400 K/uL 350 315 347    RADS: n/a Assessment:   Perforated appendicitis with phlegmon, currently on IV antibiotics.  Clinically improving and white count continues to downtrend.  Clear liquid diet today and continue to monitor.  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

## 2018-08-31 LAB — CBC WITH DIFFERENTIAL/PLATELET
Abs Immature Granulocytes: 0.43 10*3/uL — ABNORMAL HIGH (ref 0.00–0.07)
Basophils Absolute: 0.2 10*3/uL — ABNORMAL HIGH (ref 0.0–0.1)
Basophils Relative: 1 %
Eosinophils Absolute: 0.4 10*3/uL (ref 0.0–0.5)
Eosinophils Relative: 2 %
HCT: 41.1 % (ref 39.0–52.0)
Hemoglobin: 13.3 g/dL (ref 13.0–17.0)
Immature Granulocytes: 3 %
Lymphocytes Relative: 12 %
Lymphs Abs: 2.1 10*3/uL (ref 0.7–4.0)
MCH: 30.9 pg (ref 26.0–34.0)
MCHC: 32.4 g/dL (ref 30.0–36.0)
MCV: 95.4 fL (ref 80.0–100.0)
Monocytes Absolute: 1.7 10*3/uL — ABNORMAL HIGH (ref 0.1–1.0)
Monocytes Relative: 10 %
Neutro Abs: 12.9 10*3/uL — ABNORMAL HIGH (ref 1.7–7.7)
Neutrophils Relative %: 72 %
Platelets: 440 10*3/uL — ABNORMAL HIGH (ref 150–400)
RBC: 4.31 MIL/uL (ref 4.22–5.81)
RDW: 11.9 % (ref 11.5–15.5)
WBC: 17.6 10*3/uL — ABNORMAL HIGH (ref 4.0–10.5)
nRBC: 0 % (ref 0.0–0.2)

## 2018-08-31 LAB — BASIC METABOLIC PANEL
Anion gap: 10 (ref 5–15)
BUN: 10 mg/dL (ref 6–20)
CO2: 24 mmol/L (ref 22–32)
Calcium: 9.1 mg/dL (ref 8.9–10.3)
Chloride: 104 mmol/L (ref 98–111)
Creatinine, Ser: 0.68 mg/dL (ref 0.61–1.24)
GFR calc Af Amer: >60 mL/min (ref >60)
GFR calc non Af Amer: >60 mL/min (ref >60)
Glucose, Bld: 100 mg/dL — ABNORMAL HIGH (ref 70–99)
Potassium: 3.4 mmol/L — ABNORMAL LOW (ref 3.5–5.1)
Sodium: 138 mmol/L (ref 135–145)

## 2018-08-31 LAB — PHOSPHORUS: Phosphorus: 3.8 mg/dL (ref 2.5–4.6)

## 2018-08-31 LAB — MAGNESIUM: Magnesium: 1.8 mg/dL (ref 1.7–2.4)

## 2018-08-31 MED ORDER — ACETAMINOPHEN 500 MG PO TABS
1000.0000 mg | ORAL_TABLET | Freq: Four times a day (QID) | ORAL | Status: DC | PRN
  Administered 2018-08-31: 1000 mg via ORAL
  Filled 2018-08-31: qty 2

## 2018-08-31 NOTE — Progress Notes (Signed)
Subjective:  CC: Vernon Allen is a 38 y.o. male  Hospital stay day 3,   Perforated appendicitis  HPI: No acute issues overnight.  Tolerated a clear liquid diet last night but complains of early satiety.  Decided to only have coffee for this morning.  Denies any increase in pain, nausea, or vomiting  ROS:  General: Denies weight loss, weight gain, fatigue, fevers, chills, and night sweats. Heart: Denies chest pain, palpitations, racing heart, irregular heartbeat, leg pain or swelling, and decreased activity tolerance. Respiratory: Denies breathing difficulty, shortness of breath, wheezing, cough, and sputum. GI: Denies change in appetite, heartburn, nausea, vomiting, constipation, diarrhea, and blood in stool. GU: Denies difficulty urinating, pain with urinating, urgency, frequency, blood in urine.   Objective:   Temp:  [98 F (36.7 C)-99.3 F (37.4 C)] 99 F (37.2 C) (05/10 1214) Pulse Rate:  [84-88] 84 (05/10 1214) Resp:  [18-19] 18 (05/10 1214) BP: (125-139)/(77-86) 128/86 (05/10 1214) SpO2:  [99 %-100 %] 99 % (05/10 1214)     Height: 5\' 9"  (175.3 cm) Weight: 90.7 kg BMI (Calculated): 29.52   Intake/Output this shift:   Intake/Output Summary (Last 24 hours) at 08/31/2018 1315 Last data filed at 08/31/2018 1305 Gross per 24 hour  Intake 555.52 ml  Output -  Net 555.52 ml    Constitutional :  alert, cooperative, appears stated age and no distress  Respiratory:  clear to auscultation bilaterally  Cardiovascular:  regular rate and rhythm  Gastrointestinal: Soft, no guarding, minimal tenderness in right lower quadrant area..   Skin: Cool and moist.   Psychiatric: Normal affect, non-agitated, not confused       LABS:  CMP Latest Ref Rng & Units 08/31/2018 08/30/2018 08/29/2018  Glucose 70 - 99 mg/dL 630(Z) 601(U) 932(T)  BUN 6 - 20 mg/dL 10 6 11   Creatinine 0.61 - 1.24 mg/dL 5.57 3.22 0.25  Sodium 135 - 145 mmol/L 138 140 139  Potassium 3.5 - 5.1 mmol/L 3.4(L) 3.3(L) 3.5   Chloride 98 - 111 mmol/L 104 108 105  CO2 22 - 32 mmol/L 24 24 24   Calcium 8.9 - 10.3 mg/dL 9.1 4.2(H) 0.6(C)  Total Protein 6.5 - 8.1 g/dL - - 7.2  Total Bilirubin 0.3 - 1.2 mg/dL - - 0.8  Alkaline Phos 38 - 126 U/L - - 91  AST 15 - 41 U/L - - 11(L)  ALT 0 - 44 U/L - - 7   CBC Latest Ref Rng & Units 08/31/2018 08/30/2018 08/29/2018  WBC 4.0 - 10.5 K/uL 17.6(H) 19.7(H) 21.8(H)  Hemoglobin 13.0 - 17.0 g/dL 37.6 12.0(L) 12.8(L)  Hematocrit 39.0 - 52.0 % 41.1 37.6(L) 38.4(L)  Platelets 150 - 400 K/uL 440(H) 350 315    RADS: n/a Assessment:   Perforated appendicitis with phlegmon, currently on IV antibiotics.  Clinically improving and white count continues to downtrend, but still elevated.  We will continue with clear liquid diet today and continue to monitor.  IV antibiotics will be continued as well until white count is lower.  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

## 2018-09-01 LAB — BASIC METABOLIC PANEL
Anion gap: 10 (ref 5–15)
BUN: 10 mg/dL (ref 6–20)
CO2: 27 mmol/L (ref 22–32)
Calcium: 8.8 mg/dL — ABNORMAL LOW (ref 8.9–10.3)
Chloride: 99 mmol/L (ref 98–111)
Creatinine, Ser: 0.72 mg/dL (ref 0.61–1.24)
GFR calc Af Amer: >60 mL/min (ref >60)
GFR calc non Af Amer: >60 mL/min (ref >60)
Glucose, Bld: 96 mg/dL (ref 70–99)
Potassium: 3.6 mmol/L (ref 3.5–5.1)
Sodium: 136 mmol/L (ref 135–145)

## 2018-09-01 LAB — CBC WITH DIFFERENTIAL/PLATELET
Abs Immature Granulocytes: 0.76 10*3/uL — ABNORMAL HIGH (ref 0.00–0.07)
Basophils Absolute: 0.2 10*3/uL — ABNORMAL HIGH (ref 0.0–0.1)
Basophils Relative: 1 %
Eosinophils Absolute: 0.3 10*3/uL (ref 0.0–0.5)
Eosinophils Relative: 2 %
HCT: 38.3 % — ABNORMAL LOW (ref 39.0–52.0)
Hemoglobin: 12.5 g/dL — ABNORMAL LOW (ref 13.0–17.0)
Immature Granulocytes: 5 %
Lymphocytes Relative: 14 %
Lymphs Abs: 2.3 10*3/uL (ref 0.7–4.0)
MCH: 30.7 pg (ref 26.0–34.0)
MCHC: 32.6 g/dL (ref 30.0–36.0)
MCV: 94.1 fL (ref 80.0–100.0)
Monocytes Absolute: 1.7 10*3/uL — ABNORMAL HIGH (ref 0.1–1.0)
Monocytes Relative: 11 %
Neutro Abs: 11.4 10*3/uL — ABNORMAL HIGH (ref 1.7–7.7)
Neutrophils Relative %: 67 %
Platelets: 475 10*3/uL — ABNORMAL HIGH (ref 150–400)
RBC: 4.07 MIL/uL — ABNORMAL LOW (ref 4.22–5.81)
RDW: 11.8 % (ref 11.5–15.5)
WBC: 16.6 10*3/uL — ABNORMAL HIGH (ref 4.0–10.5)
nRBC: 0 % (ref 0.0–0.2)

## 2018-09-01 LAB — PHOSPHORUS: Phosphorus: 3.8 mg/dL (ref 2.5–4.6)

## 2018-09-01 LAB — MAGNESIUM: Magnesium: 1.9 mg/dL (ref 1.7–2.4)

## 2018-09-01 MED ORDER — ALUM & MAG HYDROXIDE-SIMETH 200-200-20 MG/5ML PO SUSP
30.0000 mL | Freq: Four times a day (QID) | ORAL | Status: DC | PRN
  Administered 2018-09-01 – 2018-09-04 (×2): 30 mL via ORAL
  Filled 2018-09-01 (×2): qty 30

## 2018-09-01 MED ORDER — SODIUM CHLORIDE 0.9 % IV SOLN
INTRAVENOUS | Status: DC | PRN
  Administered 2018-09-01 (×2): 250 mL via INTRAVENOUS

## 2018-09-01 MED ORDER — POTASSIUM CHLORIDE CRYS ER 20 MEQ PO TBCR
40.0000 meq | EXTENDED_RELEASE_TABLET | Freq: Once | ORAL | Status: AC
  Administered 2018-09-01: 40 meq via ORAL
  Filled 2018-09-01: qty 2

## 2018-09-01 NOTE — Progress Notes (Signed)
09/01/2018  Subjective: No acute events.  Patient reports that he's having heartburn/irritation symptoms at his upper abdomen after eating, but no worsening RLQ pain.  Denies any nausea or vomiting.  Pain in RLQ improving.  No fevers.  Vital signs: Temp:  [98.2 F (36.8 C)-99 F (37.2 C)] 98.2 F (36.8 C) (05/11 0411) Pulse Rate:  [84-98] 84 (05/11 0411) Resp:  [16-18] 18 (05/11 0411) BP: (125-128)/(77-86) 125/77 (05/11 0411) SpO2:  [99 %-100 %] 100 % (05/11 0411)   Intake/Output: 05/10 0701 - 05/11 0700 In: 1621.3 [P.O.:1140; I.V.:0.2; IV Piggyback:481.1] Out: 350 [Urine:350] Last BM Date: 08/30/18  Physical Exam: Constitutional: No acute distress Abdomen:  Soft, non-distended, non-tender to palpation in RLQ.   Labs:  Recent Labs    08/31/18 0926 09/01/18 0437  WBC 17.6* 16.6*  HGB 13.3 12.5*  HCT 41.1 38.3*  PLT 440* 475*   Recent Labs    08/31/18 0926 09/01/18 0437  NA 138 136  K 3.4* 3.6  CL 104 99  CO2 24 27  GLUCOSE 100* 96  BUN 10 10  CREATININE 0.68 0.72  CALCIUM 9.1 8.8*   No results for input(s): LABPROT, INR in the last 72 hours.  Imaging: No results found.  Assessment/Plan: This is a 38 y.o. male with perforated appendicitis  --Will advance diet to regular today --Will add Maalox prn for heartburn --Continue IV Meropenem today as his WBC still elevated, though decreasing.  Clinically not having suspicion for abscess. --Possible discharge to home tomorrow if WBC continues to improve and his pain continues to improve.  Will need to discuss with pharmacy what the better outpatient antibiotic would be given his allergy list.   Howie Ill, MD [facility]

## 2018-09-02 ENCOUNTER — Inpatient Hospital Stay: Payer: Self-pay

## 2018-09-02 ENCOUNTER — Encounter: Payer: Self-pay | Admitting: Radiology

## 2018-09-02 LAB — CBC WITH DIFFERENTIAL/PLATELET
Abs Immature Granulocytes: 0.2 10*3/uL — ABNORMAL HIGH (ref 0.00–0.07)
Basophils Absolute: 0 10*3/uL (ref 0.0–0.1)
Basophils Relative: 0 %
Eosinophils Absolute: 0.2 10*3/uL (ref 0.0–0.5)
Eosinophils Relative: 1 %
HCT: 37.8 % — ABNORMAL LOW (ref 39.0–52.0)
Hemoglobin: 12.5 g/dL — ABNORMAL LOW (ref 13.0–17.0)
Lymphocytes Relative: 18 %
Lymphs Abs: 3.1 10*3/uL (ref 0.7–4.0)
MCH: 30.6 pg (ref 26.0–34.0)
MCHC: 33.1 g/dL (ref 30.0–36.0)
MCV: 92.6 fL (ref 80.0–100.0)
Metamyelocytes Relative: 1 %
Monocytes Absolute: 1.5 10*3/uL — ABNORMAL HIGH (ref 0.1–1.0)
Monocytes Relative: 9 %
Neutro Abs: 11.6 10*3/uL — ABNORMAL HIGH (ref 1.7–7.7)
Neutrophils Relative %: 68 %
Other: 3 %
Platelets: 531 10*3/uL — ABNORMAL HIGH (ref 150–400)
RBC: 4.08 MIL/uL — ABNORMAL LOW (ref 4.22–5.81)
RDW: 11.9 % (ref 11.5–15.5)
WBC: 17.1 10*3/uL — ABNORMAL HIGH (ref 4.0–10.5)
nRBC: 0 % (ref 0.0–0.2)

## 2018-09-02 LAB — PATHOLOGIST SMEAR REVIEW

## 2018-09-02 MED ORDER — IOHEXOL 240 MG/ML SOLN
25.0000 mL | INTRAMUSCULAR | Status: AC
  Administered 2018-09-02 (×2): 25 mL via ORAL

## 2018-09-02 MED ORDER — FENTANYL CITRATE (PF) 100 MCG/2ML IJ SOLN
INTRAMUSCULAR | Status: AC
  Filled 2018-09-02: qty 4

## 2018-09-02 MED ORDER — MIDAZOLAM HCL 2 MG/2ML IJ SOLN
INTRAMUSCULAR | Status: AC | PRN
  Administered 2018-09-02 (×4): 1 mg via INTRAVENOUS

## 2018-09-02 MED ORDER — FENTANYL CITRATE (PF) 100 MCG/2ML IJ SOLN
INTRAMUSCULAR | Status: AC | PRN
  Administered 2018-09-02 (×2): 50 ug via INTRAVENOUS

## 2018-09-02 MED ORDER — IOHEXOL 300 MG/ML  SOLN
100.0000 mL | Freq: Once | INTRAMUSCULAR | Status: AC | PRN
  Administered 2018-09-02: 09:00:00 100 mL via INTRAVENOUS

## 2018-09-02 MED ORDER — MIDAZOLAM HCL 5 MG/5ML IJ SOLN
INTRAMUSCULAR | Status: AC
  Filled 2018-09-02: qty 5

## 2018-09-02 MED ORDER — SODIUM CHLORIDE 0.9% FLUSH
5.0000 mL | Freq: Three times a day (TID) | INTRAVENOUS | Status: DC
  Administered 2018-09-02 – 2018-09-04 (×6): 5 mL

## 2018-09-02 NOTE — Progress Notes (Signed)
Patient clinically stable post percutaneous abscess drain placement per Dr Miles Costain, tolerated procedure well , remained stable throughout procedure. Versed 4mg /fentanyl iv given for  Procedure. Denies complaints at this time. Report given to Lenda Kelp on 2C with questions answered.sinus rhythm per monitor.

## 2018-09-02 NOTE — Progress Notes (Addendum)
ADDENDUM 09/02/2018 (1043):   CT Abdomen/Pelvis reviewed which identified an abscess. Will consult IR for evaluation for percutaneous drain placement.  IMPRESSION: 1. Redemonstrated sequelae of perforated appendicitis, with a circumscribed retroperitoneal fluid collection containing appendicoliths, consistent with abscess posterior to the cecum measuring approximately 9.7 x 4.9 x 6.2 cm (series 2, image 50, series 5, image 47). This location would generally be amenable to percutaneous CT-guided drain placement if desired. 2. The terminal ileum and ileocecal valve are again noted to be very edematous, likely reactive.    [facility] SURGICAL PROGRESS NOTE (cpt [id number])  Hospital Day(s): 5.   Post op day(s):  Marland Kitchen   Interval History: Patient seen and examined, no acute events or new complaints overnight. Patient reports that he is feeling "full" this morning however he attributes that to drinking the PO contrast. No reports of fever, chills, nausea, emesis, or abdominal pain. He continues to report flatus and BMs. He tolerated a regular diet yesterday however was made NPO this morning in anticipation of CT and possible drain placment. Mobilizing well.  Still with persistent leukocytosis to 17.1K this morning, prompting CT scan.   Review of Systems:  Constitutional: denies fever, chills  HEENT: denies cough or congestion  Respiratory: denies any shortness of breath  Cardiovascular: denies chest pain or palpitations  Gastrointestinal: denies abdominal pain, N/V, or diarrhea/and bowel function as per interval history Genitourinary: denies burning with urination or urinary frequency   Vital signs in last 24 hours: [min-max] current  Temp:  [98.4 F (36.9 C)-99.8 F (37.7 C)] 99.1 F (37.3 C) (05/12 0401) Pulse Rate:  [86-98] 86 (05/12 0401) Resp:  [17-19] 19 (05/12 0401) BP: (119-124)/(75-82) 119/77 (05/12 0401) SpO2:  [96 %-98 %] 96 % (05/12 0401)     Height: 5\' 9"  (175.3  cm) Weight: 90.7 kg BMI (Calculated): 29.52   Intake/Output this shift:  No intake/output data recorded.    Physical Exam:  Constitutional: alert, cooperative and no distress  HENT: normocephalic without obvious abnormality  Respiratory: breathing non-labored at rest  Cardiovascular: regular rate and sinus rhythm  Gastrointestinal: soft, mildly tender in RLQ, non-distended, no rebound/guarding Musculoskeletal: UE and LE FROM, no edema or wounds, motor and sensation grossly intact, NT    Labs:  CBC Latest Ref Rng & Units 09/02/2018 09/01/2018 08/31/2018  WBC 4.0 - 10.5 K/uL 17.1(H) 16.6(H) 17.6(H)  Hemoglobin 13.0 - 17.0 g/dL 12.5(L) 12.5(L) 13.3  Hematocrit 39.0 - 52.0 % 37.8(L) 38.3(L) 41.1  Platelets 150 - 400 K/uL 531(H) 475(H) 440(H)   CMP Latest Ref Rng & Units 09/01/2018 08/31/2018 08/30/2018  Glucose 70 - 99 mg/dL 96 193(X) 902(I)  BUN 6 - 20 mg/dL 10 10 6   Creatinine 0.61 - 1.24 mg/dL 0.97 3.53 2.99  Sodium 135 - 145 mmol/L 136 138 140  Potassium 3.5 - 5.1 mmol/L 3.6 3.4(L) 3.3(L)  Chloride 98 - 111 mmol/L 99 104 108  CO2 22 - 32 mmol/L 27 24 24   Calcium 8.9 - 10.3 mg/dL 2.4(Q) 9.1 6.8(T)  Total Protein 6.5 - 8.1 g/dL - - -  Total Bilirubin 0.3 - 1.2 mg/dL - - -  Alkaline Phos 38 - 126 U/L - - -  AST 15 - 41 U/L - - -  ALT 0 - 44 U/L - - -     Imaging studies: No new pertinent imaging studies   Assessment/Plan: (ICD-10's: K35.32) 38 y.o. male still with leukocytosis (17.1K) this morning otherwise doing well with perforated appendicitis    - NPO for  now  - IV ABx (Meropenem); Day 5  - pain control prn; antiemetics prn  - Monitor abdominal examination; on-going bowel function  - Monitor leukocytosis; morning CBC  - Will obtain CT Abdomen/Pelvis to reassess abdomen for possible abscess   - Medical management of comorbidities     All of the above findings and recommendations were discussed with the patient, and the medical team, and all of patient's questions were  answered to his expressed satisfaction.   -- Lynden OxfordZachary Schulz, PA-C [facility] 09/02/2018, 7:41 AM [phone] [id number] M-F: 7am - 4pm   I saw and evaluated the patient.  I agree with the above documentation, exam, and plan, which I have edited where appropriate. Duanne GuessJennifer Nekesha Font  11:01 AM

## 2018-09-02 NOTE — Procedures (Signed)
Ruptured appendicitis with abscess  S/p CT ABSCESS DRAIN RLQ  No comp Stable ebl min 75cc pus aspirated cx sent Full report in pacs

## 2018-09-02 NOTE — Care Management (Signed)
Per Baxter Hire at Medication Management  Oral Levaquin and Flagyl available at no cost if indicated at discharge.

## 2018-09-03 LAB — CBC WITH DIFFERENTIAL/PLATELET
Abs Immature Granulocytes: 1.1 10*3/uL — ABNORMAL HIGH (ref 0.00–0.07)
Basophils Absolute: 0.2 10*3/uL — ABNORMAL HIGH (ref 0.0–0.1)
Basophils Relative: 1 %
Eosinophils Absolute: 0.3 10*3/uL (ref 0.0–0.5)
Eosinophils Relative: 2 %
HCT: 37.2 % — ABNORMAL LOW (ref 39.0–52.0)
Hemoglobin: 12.1 g/dL — ABNORMAL LOW (ref 13.0–17.0)
Immature Granulocytes: 6 %
Lymphocytes Relative: 13 %
Lymphs Abs: 2.4 10*3/uL (ref 0.7–4.0)
MCH: 30.5 pg (ref 26.0–34.0)
MCHC: 32.5 g/dL (ref 30.0–36.0)
MCV: 93.7 fL (ref 80.0–100.0)
Monocytes Absolute: 1.5 10*3/uL — ABNORMAL HIGH (ref 0.1–1.0)
Monocytes Relative: 8 %
Neutro Abs: 12.7 10*3/uL — ABNORMAL HIGH (ref 1.7–7.7)
Neutrophils Relative %: 70 %
Platelets: 571 10*3/uL — ABNORMAL HIGH (ref 150–400)
RBC: 3.97 MIL/uL — ABNORMAL LOW (ref 4.22–5.81)
RDW: 11.9 % (ref 11.5–15.5)
WBC: 18.4 10*3/uL — ABNORMAL HIGH (ref 4.0–10.5)
nRBC: 0 % (ref 0.0–0.2)

## 2018-09-03 NOTE — Progress Notes (Signed)
[facility] SURGICAL PROGRESS NOTE (cpt [id number])  Hospital Day(s): 6.   Post op day(s):  Marland Kitchen   Interval History: Patient seen and examined, no acute events or new complaints overnight. Percutaneous drain placed in RLQ yesterday by IR for intra-abdominal abscess. Patient reports that he is having soreness at his drain site wore with sudden movement, denies fever, chills, nausea, emesis. Tolerating diet. Still endorsing bowel function. Mobilizing well. Still with leukocytosis to 18.4K. No other complaints.   Review of Systems:  Constitutional: denies fever, chills  Respiratory: denies any shortness of breath  Cardiovascular: denies chest pain or palpitations  Gastrointestinal: + abdominal pain (RLQ - drain site), denied N/V, or diarrhea/and bowel function as per interval history Integumentary: + RLQ drain  Vital signs in last 24 hours: [min-max] current  Temp:  [98.6 F (37 C)-99.4 F (37.4 C)] 98.6 F (37 C) (05/13 0351) Pulse Rate:  [88-104] 90 (05/13 0351) Resp:  [12-17] 16 (05/13 0351) BP: (104-148)/(68-86) 125/80 (05/13 0351) SpO2:  [94 %-100 %] 97 % (05/13 0351) FiO2 (%):  [100 %] 100 % (05/12 1452)     Height: 5\' 9"  (175.3 cm) Weight: 90.7 kg BMI (Calculated): 29.52   Intake/Output this shift:  No intake/output data recorded.     Physical Exam:  Constitutional: alert, cooperative and no distress  HENT: normocephalic without obvious abnormality  Respiratory: breathing non-labored at rest  Cardiovascular: regular rate and sinus rhythm  Gastrointestinal: soft, non-tender, and non-distended. No rebound/guarding. Drain in RLQ with purulent drainage Musculoskeletal: no edema or wounds, motor and sensation grossly intact, NT    Labs:  CBC Latest Ref Rng & Units 09/03/2018 09/02/2018 09/01/2018  WBC 4.0 - 10.5 K/uL 18.4(H) 17.1(H) 16.6(H)  Hemoglobin 13.0 - 17.0 g/dL 12.1(L) 12.5(L) 12.5(L)  Hematocrit 39.0 - 52.0 % 37.2(L) 37.8(L) 38.3(L)  Platelets 150 - 400  K/uL 571(H) 531(H) 475(H)   CMP Latest Ref Rng & Units 09/01/2018 08/31/2018 08/30/2018  Glucose 70 - 99 mg/dL 96 235(T) 614(E)  BUN 6 - 20 mg/dL 10 10 6   Creatinine 0.61 - 1.24 mg/dL 3.15 4.00 8.67  Sodium 135 - 145 mmol/L 136 138 140  Potassium 3.5 - 5.1 mmol/L 3.6 3.4(L) 3.3(L)  Chloride 98 - 111 mmol/L 99 104 108  CO2 22 - 32 mmol/L 27 24 24   Calcium 8.9 - 10.3 mg/dL 6.1(P) 9.1 5.0(D)  Total Protein 6.5 - 8.1 g/dL - - -  Total Bilirubin 0.3 - 1.2 mg/dL - - -  Alkaline Phos 38 - 126 U/L - - -  AST 15 - 41 U/L - - -  ALT 0 - 44 U/L - - -     Imaging studies: No new pertinent imaging studies   Assessment/Plan: (ICD-10's: K35.32) 38 y.o. male still with persistent leukocytosis (18.4K) 1 day s/p percutaneous drain placement with IR for intra-abdominal abscess secondary to perforated appendicitis.   - Okay for Regular diet   - IV ABx (Meropenem); Day 6             - pain control prn; antiemetics prn             - Monitor abdominal examination; on-going bowel function             - Monitor leukocytosis; morning CBC   - Medical management of comorbidities     - Holding DVT prophylaxis secondary to allergy history     - Discharge Planning: Awaiting WBC improvement, will need ABx (Bactrim & Flagyl), drain teaching  All  of the above findings and recommendations were discussed with the patient, and the medical team, and all of patient's questions were answered to his expressed satisfaction.   -- Lynden OxfordZachary , PA-C [facility] 09/03/2018, 7:29 AM [phone] M-F: 7am - 4pm

## 2018-09-03 NOTE — Discharge Instructions (Signed)
Appendicitis, Adult    Appendicitis is inflammation of the appendix. The appendix is a finger-shaped tube that is attached to the large intestine. If appendicitis is not treated, it can cause the appendix to tear (rupture). A ruptured appendix can lead to a life-threatening infection. It can also cause a painful collection of pus (abscess) to form in the appendix.  What are the causes?  This condition may be caused by a blockage in the appendix that leads to infection. The blockage can be caused by:  · A ball of stool (feces).  · Enlarged lymph glands.  In some cases, the cause may not be known.  What increases the risk?  Age is a risk factor. You are more likely to develop this condition if you are between 10 and 30 years of age.  What are the signs or symptoms?  Symptoms of this condition include:  · Pain that starts around the belly button and moves toward the lower right part of the abdomen. The pain can become more severe as time passes. It gets worse with coughing or sudden movements.  · Tenderness in the lower right abdomen.  · Nausea.  · Vomiting.  · Loss of appetite.  · Fever.  · Difficulty passing stool (constipation).  · Passing very loose stools (diarrhea).  · Generally feeling unwell.  How is this diagnosed?  This condition may be diagnosed with:  · A physical exam.  · Blood tests.  · Urine test.  To confirm the diagnosis, an ultrasound, MRI, or CT scan may be done.  How is this treated?  This condition is usually treated with surgery to remove the appendix (appendectomy). There are two methods for doing an appendectomy:  · Open appendectomy. In this surgery, the appendix is removed through a large incision that is made in the lower right abdomen. This procedure may be recommended if:  ? You have major scarring from a previous surgery.  ? You have a bleeding disorder.  ? You are pregnant and are about to give birth.  ? You have a condition that makes it hard to do surgery through small incisions  (laparoscopic procedure). This includes severe infection or a ruptured appendix.  · Laparoscopic appendectomy. In this surgery, the appendix is removed through small incisions. This procedure usually causes less pain and fewer problems than an open appendectomy. It also has a shorter recovery time.  If the appendix has ruptured and an abscess has formed:  · A drain may be placed into the abscess to remove fluid.  · Antibiotic medicines may be given through an IV.  · The appendix may or may not need to be removed.  Follow these instructions at home:  If you had surgery, follow instructions from your health care provider about how to care for yourself at home and how to care for your incision.  Medicines  · Take over-the-counter and prescription medicines only as told by your health care provider.  · If you were prescribed an antibiotic medicine, take it as told by your health care provider. Do not stop taking the antibiotic even if you start to feel better.  Eating and drinking  · Follow instructions from your health care provider about eating restrictions. You may slowly resume a regular diet once your nausea or vomiting stops.  General instructions  · Do not use any products that contain nicotine or tobacco, such as cigarettes, e-cigarettes, and chewing tobacco. If you need help quitting, ask your health care provider.  ·   Do not drive or use heavy machinery while taking prescription pain medicine.  · Ask your health care provider if the medicine prescribed to you can cause constipation. You may need to take steps to prevent or treat constipation, such as:  ? Drink enough fluid to keep your urine pale yellow.  ? Take over-the-counter or prescription medicines.  ? Eat foods that are high in fiber, such as beans, whole grains, and fresh fruits and vegetables.  ? Limit foods that are high in fat and processed sugars, such as fried or sweet foods.  · Keep all follow-up visits as told by your health care provider. This  is important.  Contact a health care provider if:  · There is pus, blood, or excessive drainage coming from your incision.  · You have nausea or vomiting.  Get help right away if you have:  · Worsening abdominal pain.  · A fever.  · Chills.  · Fatigue.  · Muscle aches.  · Shortness of breath.  Summary  · Appendicitis is inflammation of the appendix.  · This condition may be caused by a blockage in the appendix that leads to infection.  · This condition is usually treated with surgery to remove the appendix.  This information is not intended to replace advice given to you by your health care provider. Make sure you discuss any questions you have with your health care provider.  Document Released: 04/09/2005 Document Revised: 09/25/2017 Document Reviewed: 09/25/2017  Elsevier Interactive Patient Education © 2019 Elsevier Inc.

## 2018-09-04 LAB — CBC WITH DIFFERENTIAL/PLATELET
Abs Immature Granulocytes: 1.13 10*3/uL — ABNORMAL HIGH (ref 0.00–0.07)
Basophils Absolute: 0.2 10*3/uL — ABNORMAL HIGH (ref 0.0–0.1)
Basophils Relative: 1 %
Eosinophils Absolute: 0.4 10*3/uL (ref 0.0–0.5)
Eosinophils Relative: 3 %
HCT: 37.8 % — ABNORMAL LOW (ref 39.0–52.0)
Hemoglobin: 12.5 g/dL — ABNORMAL LOW (ref 13.0–17.0)
Immature Granulocytes: 8 %
Lymphocytes Relative: 15 %
Lymphs Abs: 2.2 10*3/uL (ref 0.7–4.0)
MCH: 30.3 pg (ref 26.0–34.0)
MCHC: 33.1 g/dL (ref 30.0–36.0)
MCV: 91.7 fL (ref 80.0–100.0)
Monocytes Absolute: 1.2 10*3/uL — ABNORMAL HIGH (ref 0.1–1.0)
Monocytes Relative: 8 %
Neutro Abs: 10 10*3/uL — ABNORMAL HIGH (ref 1.7–7.7)
Neutrophils Relative %: 65 %
Platelets: 633 10*3/uL — ABNORMAL HIGH (ref 150–400)
RBC: 4.12 MIL/uL — ABNORMAL LOW (ref 4.22–5.81)
RDW: 11.8 % (ref 11.5–15.5)
Smear Review: NORMAL
WBC: 15.1 10*3/uL — ABNORMAL HIGH (ref 4.0–10.5)
nRBC: 0 % (ref 0.0–0.2)

## 2018-09-04 MED ORDER — OXYCODONE HCL 5 MG PO TABS: 5.0000 mg | ORAL_TABLET | ORAL | 0 refills | Status: DC | PRN

## 2018-09-04 MED ORDER — METRONIDAZOLE 500 MG PO TABS: 500.0000 mg | ORAL_TABLET | Freq: Three times a day (TID) | ORAL | 0 refills | Status: AC

## 2018-09-04 MED ORDER — METRONIDAZOLE 500 MG PO TABS: 500.0000 mg | ORAL_TABLET | Freq: Three times a day (TID) | ORAL | 0 refills | Status: DC

## 2018-09-04 MED ORDER — SULFAMETHOXAZOLE-TRIMETHOPRIM 800-160 MG PO TABS: 1.0000 | ORAL_TABLET | Freq: Two times a day (BID) | ORAL | 0 refills | Status: AC

## 2018-09-04 MED ORDER — SULFAMETHOXAZOLE-TRIMETHOPRIM 800-160 MG PO TABS: 1.0000 | ORAL_TABLET | Freq: Two times a day (BID) | ORAL | 0 refills | Status: DC

## 2018-09-04 NOTE — Discharge Summary (Signed)
[facility] SUR[facility]ICAL DISCHARGE SUMMARY (cpt: [phone])  Patient ID: Vernon Allen MRN: [id number] DOB/AGE: 12-27-1980 38 y.o.  Admit date: 08/28/2018 Discharge date: 09/04/2018  Discharge Diagnoses Patient Active Problem List   Diagnosis Date Noted  . Acute appendicitis   . Perforated appendicitis 08/28/2018    Consultants Interventional Radiology  Procedures 09/02/2018:  Percutaneous Drain Placement - Dr Denny LevySchick (IR)  HPI: Vernon Allen is a 38 y.o. male w 5-6 days of abdominal pain. Pain is sharp moderate to severe in intensity and has progressed over the last 48 hrs. He does report subjective fevers and chills. Decrease appetite and N/V. No previous abd operations. HE is able to perform more than 6 METS w/o SOb or C/P. CT pers. Reviewed showing evidence of ruptured appendicitis w phlegmon and inflammatory response cecum, rigth colon and ileum. No free Air. Wbc 20k, rest of cbc and cmp ok. Drinks about 6-8 beers daily. But has not had one in 5 days, no previous DT.  Hospital Course: He was admitted to general surgery and managed conservatively with ABx. He tolerated gradual advancement of his diet however he continued to have persistent elevation in his WBC. On HD5, he underwent CT which demonstrated abscess formation and IR placed a drain. His WBC improved following this. The remainder of patient's hospital course was essentially unremarkable, and discharge planning was initiated accordingly with patient safely able to be discharged home with appropriate discharge instructions, antibiotics (Bactrim + Flagyl x10 days), pain control, and outpatient follow-up after all of his questions were answered to his expressed satisfaction.  Discharge Condition: Good   Physical Examination:  Constitutional: alert, cooperative and no distress  HENT: normocephalic without obvious abnormality  Respiratory: breathing non-labored at rest  Cardiovascular: regular rate and sinus  rhythm  Gastrointestinal: soft, non-tender, and non-distended. No rebound/guarding. Drain in RLQ with seropurulent drainage Musculoskeletal: no edema or wounds, motor and sensation grossly intact, NT    Allergies as of 09/04/2018      Reactions   Beef-derived Products Anaphylaxis   Keflex [cephalexin] Anaphylaxis   Mobic [meloxicam] Anaphylaxis   Pork-derived Products Anaphylaxis   Ciprofloxacin       Medication List    TAKE these medications   brompheniramine-pseudoephedrine-DM 30-2-10 MG/5ML syrup Take 5 mLs by mouth 4 (four) times daily as needed.   EPINEPHrine 0.3 mg/0.3 mL Soaj injection Commonly known as:  EPI-PEN Inject 0.3 mLs (0.3 mg total) into the muscle once.   ipratropium-albuterol 0.5-2.5 (3) MG/3ML Soln Commonly known as:  DUONEB Take 3 mLs by nebulization every 6 (six) hours as needed.   metroNIDAZOLE 500 MG tablet Commonly known as:  Flagyl Take 1 tablet (500 mg total) by mouth 3 (three) times daily for 10 days.   oxyCODONE 5 MG immediate release tablet Commonly known as:  Oxy IR/ROXICODONE Take 1-2 tablets (5-10 mg total) by mouth every 4 (four) hours as needed for moderate pain.   sertraline 50 MG tablet Commonly known as:  ZOLOFT Take 1 tablet (50 mg total) by mouth daily.   sulfamethoxazole-trimethoprim 800-160 MG tablet Commonly known as:  BACTRIM DS Take 1 tablet by mouth 2 (two) times daily for 10 days.        Follow-up Information    Pabon, HawaiiDiego F, MD. Schedule an appointment as soon as possible for a visit on 09/08/2018.   Specialty:  General Surgery Why:  perforated appendicitis, has drain.Marland Kitchen...Marland Kitchen.needs face-to-face Contact information: [address] [place] [address] [phone]  Time spent on discharge management including discussion of hospital course, clinical condition, outpatient instructions, prescriptions, and follow up with the patient and members of the medical team: >30  minutes  -- Lynden Oxford , PA-C [facility]  09/04/2018, 9:57 AM [phone] M-F: 7am - 4pm

## 2018-09-04 NOTE — Progress Notes (Signed)
MD ordered patient to be discharged home.  Discharge instructions were reviewed with the patient and he voiced understanding.  Follow-up appointment was made.  Prescriptions given to the patient.  Patient educated on how to flush the tube and care for it.  He was also given supplies for a dressing change.  IV was removed with catheter intact.  All patients questions were answered.  Patient leaving via wheelchair escorted by auxillary.

## 2018-09-04 NOTE — TOC Transition Note (Signed)
Transition of Care [facility]) - CM/SW Discharge Note   Patient Details  Name: Vernon Allen MRN: [id number] Date of Birth: 07/04/1980  [facility]) CM/SW Contact:  Chapman Fitch, RN Phone Number: 09/04/2018, 1:36 PM   Clinical Narrative:    Patient discharging today.  Lives at home with fiance and children. Patient states that he is employed.  Due to not being able to work post op he is hoping to qualify for unemployment.   Patient denies issues with transportation. Patient states that he will be picking his medication up at [facility] post discharge.  Patient provided with [email or web address] coupons.  Flagyl $15.68, oxy $7.43, Bactrim $11.98.  Patient confirms he will be able to obtain medication after discharge.       Final next level of care: Home/Self Care Barriers to Discharge: Barriers Resolved   Patient Goals and CMS Choice Patient states their goals for this hospitalization and ongoing recovery are:: I hope I can get unemployement       Discharge Placement                       Discharge Plan and Services   Discharge Planning Services: CM Consult, Indigent Health Clinic, Medication Assistance                                 Social Determinants of Health (SDOH) Interventions     Readmission Risk Interventions No flowsheet data found.

## 2018-09-07 LAB — AEROBIC/ANAEROBIC CULTURE W GRAM STAIN (SURGICAL/DEEP WOUND)

## 2018-09-08 ENCOUNTER — Ambulatory Visit (INDEPENDENT_AMBULATORY_CARE_PROVIDER_SITE_OTHER): Payer: Self-pay | Admitting: Surgery

## 2018-09-08 ENCOUNTER — Other Ambulatory Visit: Payer: Self-pay

## 2018-09-08 ENCOUNTER — Encounter: Payer: Self-pay | Admitting: Surgery

## 2018-09-08 VITALS — BP 123/85 | HR 84 | Temp 97.7°F | Resp 16 | Ht 70.0 in | Wt 201.2 lb

## 2018-09-08 DIAGNOSIS — K3532 Acute appendicitis with perforation and localized peritonitis, without abscess: Secondary | ICD-10-CM

## 2018-09-08 NOTE — Progress Notes (Signed)
Outpatient Surgical Follow Up  09/08/2018  Vernon Allen is an 38 y.o. male.   Chief Complaint  Patient presents with  . Routine Post Op    Appedicitis    HPI: 38 year old male well-known to me with a previous history of appendicitis with perforation and phlegmon developed an abscess requiring percutaneous drain placement.  He is doing well and it is only putting out 10 cc a day from the drain.  The output is serous.  No fevers or chills he is taking p.o. and has had improvement of his pain.  History reviewed. No pertinent past medical history.  Past Surgical History:  Procedure Laterality Date  . BACK SURGERY      History reviewed. No pertinent family history.  Social History:  reports that he has been smoking cigarettes. He has never used smokeless tobacco. He reports current alcohol use. He reports current drug use. Drug: Marijuana.  Allergies:  Allergies  Allergen Reactions  . Beef-Derived Products Anaphylaxis  . Keflex [Cephalexin] Anaphylaxis  . Mobic [Meloxicam] Anaphylaxis  . Pork-Derived Products Anaphylaxis  . Ciprofloxacin     Medications reviewed.    ROS Full ROS performed and is otherwise negative other than what is stated in HPI   BP 123/85   Pulse 84   Temp 97.7 F (36.5 C) (Temporal)   Resp 16   Ht 5\' 10"  (1.778 m)   Wt 201 lb 3.2 oz (91.3 kg)   SpO2 98%   BMI 28.87 kg/m   Physical Exam Vitals signs and nursing note reviewed.  Constitutional:      Appearance: Normal appearance.  Cardiovascular:     Rate and Rhythm: Normal rate and regular rhythm.  Pulmonary:     Effort: Pulmonary effort is normal.     Breath sounds: No stridor.  Abdominal:     General: Abdomen is flat. There is no distension.     Palpations: There is no mass.     Tenderness: There is no abdominal tenderness. There is no guarding or rebound.     Hernia: No hernia is present.     Comments: Drain removed. ( serous)  Skin:    General: Skin is warm and dry.   Capillary Refill: Capillary refill takes less than 2 seconds.  Neurological:     General: No focal deficit present.     Mental Status: He is alert and oriented to person, place, and time.  Psychiatric:        Mood and Affect: Mood normal.        Behavior: Behavior normal.        Thought Content: Thought content normal.        Judgment: Judgment normal.        Assessment/Plan: Appendicitis with abscess and phlegmon status post drain placement.  We remove the drain today.  I will see him back in a few weeks and at that time schedule elective interval appendectomy.  Currently continues to do very well.  No need for any emergent surgical intervention at this time Greater than 50% of the 15 minutes  visit was spent in counseling/coordination of care   Sterling Big, MD [facility] General Surgeon

## 2018-09-08 NOTE — Patient Instructions (Signed)
We  have removed your drain today. Please keep a dressing over the area until it heals completely.   Please see your follow up appointment listed below.

## 2018-09-11 ENCOUNTER — Telehealth: Payer: Self-pay | Admitting: *Deleted

## 2018-09-11 NOTE — Telephone Encounter (Signed)
No restrictions needed. Patient wanting to return to work on 09/16/18. Note faxed to [phone] attention [facility].

## 2018-09-11 NOTE — Telephone Encounter (Signed)
Patient called and stated that he needs a back to work note saying when he can return and what his restrictions are.

## 2018-09-19 ENCOUNTER — Telehealth: Payer: Self-pay | Admitting: *Deleted

## 2018-09-19 NOTE — Telephone Encounter (Signed)
Patient called and stated that when he woke up this morning he has some stomach pains, he didn't know if this should be something he should be worried about. Please call and advise

## 2018-09-19 NOTE — Telephone Encounter (Signed)
Left message for patient to call back by 3  Today

## 2018-10-01 ENCOUNTER — Telehealth: Payer: Self-pay | Admitting: Surgery

## 2018-10-01 DIAGNOSIS — K3532 Acute appendicitis with perforation and localized peritonitis, without abscess: Secondary | ICD-10-CM

## 2018-10-01 NOTE — Telephone Encounter (Signed)
Patient would like a call back from a RN/CMA to discuss his concerns and symptoms.   He states that he went back to work last week and started having spasms on his right side of abdomen last Friday. He states that he wakes in the middle of the night and he has a throbbing pain, this happens 2-3 times a night. The pain eases off within a couple minutes. He states that this is causing him a lot of anxiety and concern.   Patient is aware that he does have an appointment with Dr Dahlia Byes on Monday, however, he would like to discuss this with someone to see if these painful spasms should be of concern.   Patient has had no fever, nausea, vomiting. Normal BM's.   Please call patient at [phone].

## 2018-10-01 NOTE — Telephone Encounter (Signed)
CT scan scheduled 10-02-18 at 8:00 [facility]. Pick up prep kit tonight and nothing to eat or drink 4 hours before test. May have labs drawn tonight or tomorrow. Pt aware of instructions.

## 2018-10-01 NOTE — Telephone Encounter (Signed)
I talked with the patient and he denies any reflux or indigestion. 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 He admits to increased anxiety since he noticed the "spasms" only occurring at night. CT scan A/P and labs BMP and CBC per Dr Dahlia Byes. May be out of work until appointment time, pt aware.

## 2018-10-01 NOTE — Addendum Note (Signed)
Addended by: Carson Myrtle on: 10/01/2018 04:59 PM   Modules accepted: Orders

## 2018-10-02 ENCOUNTER — Telehealth: Payer: Self-pay | Admitting: *Deleted

## 2018-10-02 ENCOUNTER — Ambulatory Visit
Admission: RE | Admit: 2018-10-02 | Discharge: 2018-10-02 | Disposition: A | Discharge status: Home / Self Care | Payer: Self-pay | Source: Ambulatory Visit | Attending: Surgery | Admitting: Surgery

## 2018-10-02 ENCOUNTER — Other Ambulatory Visit: Payer: Self-pay

## 2018-10-02 ENCOUNTER — Other Ambulatory Visit
Admission: RE | Admit: 2018-10-02 | Discharge: 2018-10-02 | Disposition: A | Discharge status: Home / Self Care | Payer: Self-pay | Source: Ambulatory Visit | Attending: Surgery | Admitting: Surgery

## 2018-10-02 DIAGNOSIS — K3532 Acute appendicitis with perforation and localized peritonitis, without abscess: Secondary | ICD-10-CM | POA: Insufficient documentation

## 2018-10-02 LAB — BASIC METABOLIC PANEL
Anion gap: 10 (ref 5–15)
BUN: 16 mg/dL (ref 6–20)
CO2: 23 mmol/L (ref 22–32)
Calcium: 9.1 mg/dL (ref 8.9–10.3)
Chloride: 104 mmol/L (ref 98–111)
Creatinine, Ser: 0.69 mg/dL (ref 0.61–1.24)
GFR calc Af Amer: >60 mL/min (ref >60)
GFR calc non Af Amer: >60 mL/min (ref >60)
Glucose, Bld: 101 mg/dL — ABNORMAL HIGH (ref 70–99)
Potassium: 4.1 mmol/L (ref 3.5–5.1)
Sodium: 137 mmol/L (ref 135–145)

## 2018-10-02 LAB — CBC WITH DIFFERENTIAL/PLATELET
Abs Immature Granulocytes: 0.07 10*3/uL (ref 0.00–0.07)
Basophils Absolute: 0.1 10*3/uL (ref 0.0–0.1)
Basophils Relative: 1 %
Eosinophils Absolute: 0.2 10*3/uL (ref 0.0–0.5)
Eosinophils Relative: 3 %
HCT: 42.4 % (ref 39.0–52.0)
Hemoglobin: 14.3 g/dL (ref 13.0–17.0)
Immature Granulocytes: 1 %
Lymphocytes Relative: 31 %
Lymphs Abs: 2.6 10*3/uL (ref 0.7–4.0)
MCH: 31.1 pg (ref 26.0–34.0)
MCHC: 33.7 g/dL (ref 30.0–36.0)
MCV: 92.2 fL (ref 80.0–100.0)
Monocytes Absolute: 0.8 10*3/uL (ref 0.1–1.0)
Monocytes Relative: 9 %
Neutro Abs: 4.6 10*3/uL (ref 1.7–7.7)
Neutrophils Relative %: 55 %
Platelets: 229 10*3/uL (ref 150–400)
RBC: 4.6 MIL/uL (ref 4.22–5.81)
RDW: 13.9 % (ref 11.5–15.5)
WBC: 8.3 10*3/uL (ref 4.0–10.5)
nRBC: 0 % (ref 0.0–0.2)

## 2018-10-02 MED ORDER — SULFAMETHOXAZOLE-TRIMETHOPRIM 800-160 MG PO TABS: 1.0000 | ORAL_TABLET | Freq: Two times a day (BID) | ORAL | 0 refills | Status: DC

## 2018-10-02 MED ORDER — IOHEXOL 300 MG/ML  SOLN
100.0000 mL | Freq: Once | INTRAMUSCULAR | Status: AC | PRN
  Administered 2018-10-02: 100 mL via INTRAVENOUS

## 2018-10-02 MED ORDER — METRONIDAZOLE 500 MG PO TABS: 500.0000 mg | ORAL_TABLET | Freq: Three times a day (TID) | ORAL | 0 refills | Status: DC

## 2018-10-02 NOTE — Telephone Encounter (Signed)
Notified patient as instructed,CT, there is no abscess to drain. There is some inflammation. flagyl 500 TID and bactrim DS BID for 2 weeks send it.  patient pleased. Discussed follow-up appointments, patient agrees

## 2018-10-06 ENCOUNTER — Encounter: Payer: Self-pay | Admitting: *Deleted

## 2018-10-06 ENCOUNTER — Other Ambulatory Visit: Payer: Self-pay

## 2018-10-06 ENCOUNTER — Ambulatory Visit (INDEPENDENT_AMBULATORY_CARE_PROVIDER_SITE_OTHER): Payer: Self-pay | Admitting: Surgery

## 2018-10-06 ENCOUNTER — Encounter: Payer: Self-pay | Admitting: Surgery

## 2018-10-06 ENCOUNTER — Other Ambulatory Visit
Admission: RE | Admit: 2018-10-06 | Discharge: 2018-10-06 | Disposition: A | Discharge status: Home / Self Care | Payer: HRSA Program | Source: Ambulatory Visit | Attending: Surgery | Admitting: Surgery

## 2018-10-06 ENCOUNTER — Other Ambulatory Visit: Payer: Self-pay | Admitting: *Deleted

## 2018-10-06 VITALS — BP 148/99 | HR 69 | Temp 97.5°F | Resp 16 | Ht 72.0 in | Wt 212.0 lb

## 2018-10-06 DIAGNOSIS — K3532 Acute appendicitis with perforation and localized peritonitis, without abscess: Secondary | ICD-10-CM

## 2018-10-06 DIAGNOSIS — Z1159 Encounter for screening for other viral diseases: Secondary | ICD-10-CM | POA: Insufficient documentation

## 2018-10-06 MED ORDER — NEOMYCIN SULFATE 500 MG PO TABS: ORAL_TABLET | ORAL | 0 refills | Status: DC

## 2018-10-06 MED ORDER — POLYETHYLENE GLYCOL 3350 17 GM/SCOOP PO POWD: 1.0000 | Freq: Once | ORAL | 0 refills | Status: DC

## 2018-10-06 MED ORDER — BISACODYL EC 5 MG PO TBEC: DELAYED_RELEASE_TABLET | ORAL | 0 refills | Status: DC

## 2018-10-06 MED ORDER — POLYETHYLENE GLYCOL 3350 17 GM/SCOOP PO POWD: 1.0000 | Freq: Once | ORAL | 0 refills | Status: AC

## 2018-10-06 MED ORDER — METRONIDAZOLE 500 MG PO TABS: 500.0000 mg | ORAL_TABLET | Freq: Three times a day (TID) | ORAL | 0 refills | Status: DC

## 2018-10-06 MED ORDER — ERYTHROMYCIN 500 MG PO TBEC: 500.0000 mg | DELAYED_RELEASE_TABLET | ORAL | 0 refills | Status: DC

## 2018-10-06 NOTE — Patient Instructions (Signed)
Appendicitis, Adult  The appendix is a tube in the body that is shaped like a finger. It is attached to the large intestine. Appendicitis means that this tube is swollen (inflamed). If this is not treated, the tube can tear (rupture). This can lead to a life-threatening infection. This condition can also cause pus to build up in the appendix (abscess). What are the causes? This condition may be caused by something that blocks the appendix. These include:  A ball of poop (stool).  Lymph glands that are bigger than normal. Sometimes the cause is not known. What increases the risk? You are more likely to develop this condition if you are between 10 and 30 years of age. What are the signs or symptoms? Symptoms of this condition include:  Pain around the belly button (navel). ? The pain moves toward the lower right belly (abdomen). ? The pain can get worse with time. ? The pain can get worse if you cough. ? The pain can get worse if you move suddenly.  Tenderness in the lower right belly.  Feeling sick to your stomach (nauseous).  Throwing up (vomiting).  Not feeling hungry (loss of appetite).  A fever.  Having trouble pooping (constipation).  Watery poop (diarrhea).  Not feeling well. How is this treated? Most often, this condition is treated by taking out the appendix (appendectomy). There are two ways to do this:  Open surgery. For this method, the appendix is taken out through a large cut (incision). The cut is made in the lower right belly. This surgery may be used if: ? You have scars from another surgery. ? You have a bleeding condition. ? You are pregnant and will be having your baby soon. ? You have a condition that makes it hard to do the other type of surgery.  Laparoscopic surgery. For this method, the appendix is taken out through small cuts. Often, this surgery: ? Causes less pain. ? Causes fewer problems. ? Is easier to heal from. If your appendix tears and  pus forms:  A drain may be put into the sore. The drain will be used to get rid of the pus.  You may get an antibiotic medicine through an IV line.  Your appendix may or may not need to be taken out. Follow these instructions at home: If you had surgery, follow instructions from your doctor on how to care for yourself at home and how to take care of your cut from surgery. Medicines  Take over-the-counter and prescription medicines only as told by your doctor.  If you were prescribed an antibiotic medicine, take it as told by your doctor. Do not stop taking the antibiotic even if you start to feel better. Eating and drinking Follow instructions from your doctor about what you cannot eat or drink. You may go back to your diet slowly if:  You no longer feel sick to your stomach.  You have stopped throwing up. General instructions  Do not use any products that contain nicotine or tobacco, such as cigarettes, e-cigarettes, and chewing tobacco. If you need help quitting, ask your doctor.  Do not drive or use heavy machinery while taking prescription pain medicine.  Ask your doctor if the medicine you are taking can cause trouble pooping. You may need to take steps to prevent or treat trouble pooping: ? Drink enough fluid to keep your pee (urine) pale yellow. ? Take over-the-counter or prescription medicines. ? Eat foods that are high in fiber. These include beans,   whole grains, and fresh fruits and vegetables. ? Limit foods that are high in fat and sugar. These include fried or sweet foods.  Keep all follow-up visits as told by your doctor. This is important. Contact a doctor if:  There is pus, blood, or a lot of fluid coming from your cut or cuts from surgery.  You are sick to your stomach or you throw up. Get help right away if:  You have pain in your belly, and the pain is getting worse.  You have a fever.  You have chills.  You are very tired.  You have muscle  pain.  You are short of breath. Summary  Appendicitis is swelling of the appendix. The appendix is a tube that is shaped like a finger. It is joined to the large intestine.  This condition may be caused by something that blocks the appendix. This can lead to an infection.  This condition is usually treated by taking out the appendix. This information is not intended to replace advice given to you by your health care provider. Make sure you discuss any questions you have with your health care provider. Document Released: 07/02/2011 Document Revised: 09/25/2017 Document Reviewed: 09/25/2017 Elsevier Interactive Patient Education  2019 Elsevier Inc.  

## 2018-10-06 NOTE — Progress Notes (Signed)
Patient's surgery to be scheduled for 10-09-18 at [facility] with Dr. Dahlia Byes.  Patient has been asked to complete a bowel prep and 2 days of clear liquids. The patient states he does not have insurance and needs help getting medications due to limited funds. I did contact Samantha at the [facility]. She will reach out to the patient and call our office back to obtain a verbal order. Patient aware to be expecting her call.   The patient is aware to have COVID-19 testing done on 10-06-18 after he leaves our office at the [place] building drive thru ([address] [place]). He is aware to isolate after, have no visitors, wash hands frequently, and avoid touching face.   Patient was provided with a work note today.   The patient is aware he will be contacted by the [facility] to complete a phone interview on 10-08-18.   Patient aware to be NPO after midnight and have a driver.   He is aware to check in at the [facility] entrance where he will be screened for the coronavirus and then sent to [facility].   Patient aware that he may have no visitors and driver will need to wait in the car due to COVID-19 restrictions.   The patient verbalizes understanding of the above.   The patient is aware to call the office should he have further questions.

## 2018-10-06 NOTE — Progress Notes (Signed)
Per Vernon Allen at [facility], they were going over the price of the medications and the neomycin in not in their budget. She wanted to know if there is anything else that we would recommend that we can cover. She states she can cover all the other meds except that one. She checked the goodrx website and 6 tablets only cost $7.89 at [facility].   I did speak with Dr. Dahlia Byes and we can just do the other medications and not worry about neomycin.   Vernon Allen aware.

## 2018-10-06 NOTE — Progress Notes (Signed)
Outpatient Surgical Follow Up  10/06/2018  Vernon Allen is an 38 y.o. male.   Chief Complaint  Patient presents with  . Follow-up    HPI: This is a 38 year old male well-known to me with a prior history of perforated appendicitis with phlegmon and abscess treated with percutaneous drainage and antibiotics.  He presents with persistent right lower quadrant pain.  Few days ago I repeated a CT scan after the drain was removed.  I have personally reviewed the scan there is evidence of persistent inflammatory response around the appendix.  There is no evidence of a collection that can be drained.  There is no free air. Patient reports persistent right lower quadrant pain.  No fevers or chills.  He is taking p.o.  Of note he reports the pain is sharp and moderate in intensity.  He has started again on antibiotics without any significant improvement of his symptoms.  No past medical history on file.  Past Surgical History:  Procedure Laterality Date  . BACK SURGERY      No family history on file.  Social History:  reports that he has been smoking cigarettes. He has never used smokeless tobacco. He reports current alcohol use. He reports current drug use. Drug: Marijuana.  Allergies:  Allergies  Allergen Reactions  . Beef-Derived Products Anaphylaxis  . Keflex [Cephalexin] Anaphylaxis  . Mobic [Meloxicam] Anaphylaxis  . Pork-Derived Products Anaphylaxis  . Ciprofloxacin     Medications reviewed.    ROS Full ROS performed and is otherwise negative other than what is stated in HPI   BP (!) 148/99   Pulse 69   Temp (!) 97.5 F (36.4 C) (Skin)   Resp 16   Ht 6' (1.829 m)   Wt 212 lb (96.2 kg)   SpO2 97%   BMI 28.75 kg/m   Physical Exam Vitals signs and nursing note reviewed. Exam conducted with a chaperone present.  Constitutional:      Appearance: Normal appearance. He is normal weight.  Eyes:     General: No scleral icterus.       Right eye: No discharge.         Left eye: No discharge.  Neck:     Musculoskeletal: Normal range of motion and neck supple. No neck rigidity or muscular tenderness.  Cardiovascular:     Rate and Rhythm: Normal rate and regular rhythm.     Heart sounds: No murmur.  Pulmonary:     Effort: Pulmonary effort is normal. No respiratory distress.     Breath sounds: Normal breath sounds. No stridor. No wheezing.  Abdominal:     General: Abdomen is flat. Bowel sounds are normal. There is no distension.     Palpations: There is no mass.     Tenderness: There is abdominal tenderness. There is no guarding or rebound.     Hernia: No hernia is present.     Comments: Mild TTP RLQ, no peritonitis.  Musculoskeletal: Normal range of motion.        General: No swelling or tenderness.  Skin:    General: Skin is warm and dry.  Neurological:     General: No focal deficit present.     Mental Status: He is alert and oriented to person, place, and time.  Psychiatric:        Mood and Affect: Mood normal.        Behavior: Behavior normal.        Thought Content: Thought content normal.  Judgment: Judgment normal.      Assessment/Plan: 37-year-old male with a prior history of perforated appendicitis and abscess 6 weeks ago now with distant symptoms and CT with evidence of persistent appendicitis and inflammatory response.  I have discussed with patient in detail at length about his disease process.  He is not getting any better and the antibiotics have not worked.  I had an extensive discussion with patient regarding his disease process.  Due to the failure of antibiotic therapy persistent symptoms and persistent inflammatory response and CT scan I do think that the next that would be to perform an appendectomy.  Stands that there is a high likelihood of food of conversion to open and even performing right hemicolectomy.  We will definitely getting this scheduled this week after we complete a bowel prep on appropriate COVID 19  testing. She discussed with the patient in detail.  Risk, benefits and possible occasions including but not limited to: Bleeding, infection, the need for right colectomy and ileocolostomy, and pain.  He understands and wishes to proceed. Note that I spent more than 40 minutes in this encounter with greater than 50% spent in coordination and counseling of his care.  Taegan Standage, MD FACS General Surgeon 

## 2018-10-06 NOTE — Progress Notes (Signed)
Vernon Allen with the [facility] program called back and she believes they can assist the patient.   She left patient a message to call back.   Bowel prep medications were sent in electronically today.

## 2018-10-06 NOTE — Addendum Note (Signed)
Addended by: Dominga Ferry on: 10/06/2018 03:25 PM   Modules accepted: Orders

## 2018-10-06 NOTE — H&P (View-Only) (Signed)
Outpatient Surgical Follow Up  10/06/2018  Vernon Allen is an 38 y.o. male.   Chief Complaint  Patient presents with  . Follow-up    HPI: This is a 38 year old male well-known to me with a prior history of perforated appendicitis with phlegmon and abscess treated with percutaneous drainage and antibiotics.  He presents with persistent right lower quadrant pain.  Few days ago I repeated a CT scan after the drain was removed.  I have personally reviewed the scan there is evidence of persistent inflammatory response around the appendix.  There is no evidence of a collection that can be drained.  There is no free air. Patient reports persistent right lower quadrant pain.  No fevers or chills.  He is taking p.o.  Of note he reports the pain is sharp and moderate in intensity.  He has started again on antibiotics without any significant improvement of his symptoms.  No past medical history on file.  Past Surgical History:  Procedure Laterality Date  . BACK SURGERY      No family history on file.  Social History:  reports that he has been smoking cigarettes. He has never used smokeless tobacco. He reports current alcohol use. He reports current drug use. Drug: Marijuana.  Allergies:  Allergies  Allergen Reactions  . Beef-Derived Products Anaphylaxis  . Keflex [Cephalexin] Anaphylaxis  . Mobic [Meloxicam] Anaphylaxis  . Pork-Derived Products Anaphylaxis  . Ciprofloxacin     Medications reviewed.    ROS Full ROS performed and is otherwise negative other than what is stated in HPI   BP (!) 148/99   Pulse 69   Temp (!) 97.5 F (36.4 C) (Skin)   Resp 16   Ht 6' (1.829 m)   Wt 212 lb (96.2 kg)   SpO2 97%   BMI 28.75 kg/m   Physical Exam Vitals signs and nursing note reviewed. Exam conducted with a chaperone present.  Constitutional:      Appearance: Normal appearance. He is normal weight.  Eyes:     General: No scleral icterus.       Right eye: No discharge.         Left eye: No discharge.  Neck:     Musculoskeletal: Normal range of motion and neck supple. No neck rigidity or muscular tenderness.  Cardiovascular:     Rate and Rhythm: Normal rate and regular rhythm.     Heart sounds: No murmur.  Pulmonary:     Effort: Pulmonary effort is normal. No respiratory distress.     Breath sounds: Normal breath sounds. No stridor. No wheezing.  Abdominal:     General: Abdomen is flat. Bowel sounds are normal. There is no distension.     Palpations: There is no mass.     Tenderness: There is abdominal tenderness. There is no guarding or rebound.     Hernia: No hernia is present.     Comments: Mild TTP RLQ, no peritonitis.  Musculoskeletal: Normal range of motion.        General: No swelling or tenderness.  Skin:    General: Skin is warm and dry.  Neurological:     General: No focal deficit present.     Mental Status: He is alert and oriented to person, place, and time.  Psychiatric:        Mood and Affect: Mood normal.        Behavior: Behavior normal.        Thought Content: Thought content normal.  Judgment: Judgment normal.      Assessment/Plan: 38 year old male with a prior history of perforated appendicitis and abscess 6 weeks ago now with distant symptoms and CT with evidence of persistent appendicitis and inflammatory response.  I have discussed with patient in detail at length about his disease process.  He is not getting any better and the antibiotics have not worked.  I had an extensive discussion with patient regarding his disease process.  Due to the failure of antibiotic therapy persistent symptoms and persistent inflammatory response and CT scan I do think that the next that would be to perform an appendectomy.  Stands that there is a high likelihood of food of conversion to open and even performing right hemicolectomy.  We will definitely getting this scheduled this week after we complete a bowel prep on appropriate COVID 19  testing. She discussed with the patient in detail.  Risk, benefits and possible occasions including but not limited to: Bleeding, infection, the need for right colectomy and ileocolostomy, and pain.  He understands and wishes to proceed. Note that I spent more than 40 minutes in this encounter with greater than 50% spent in coordination and counseling of his care.  Sterling Bigiego Pabon, MD [facility] General Surgeon

## 2018-10-07 ENCOUNTER — Telehealth: Payer: Self-pay | Admitting: *Deleted

## 2018-10-07 ENCOUNTER — Ambulatory Visit: Payer: Self-pay | Admitting: Pharmacy Technician

## 2018-10-07 ENCOUNTER — Telehealth: Payer: Self-pay | Admitting: Surgery

## 2018-10-07 DIAGNOSIS — Z79899 Other long term (current) drug therapy: Secondary | ICD-10-CM

## 2018-10-07 LAB — NOVEL CORONAVIRUS, NAA (HOSP ORDER, SEND-OUT TO REF LAB; TAT 18-24 HRS): SARS-CoV-2, NAA: NOT DETECTED

## 2018-10-07 NOTE — Telephone Encounter (Signed)
Notified patient that he may have as much sugary drinks as he needs, just no red liquids.

## 2018-10-07 NOTE — Telephone Encounter (Signed)
Patient called and has some questions regarding Flagyl, he wanted to know why he is having to take that before surgery

## 2018-10-07 NOTE — Telephone Encounter (Signed)
error 

## 2018-10-07 NOTE — Telephone Encounter (Signed)
Patient called back and is now aware that the Flagyl is part of standard care for bowel surgery

## 2018-10-07 NOTE — Telephone Encounter (Signed)
Left a message for the patient that antibiotics for Bowel surgery was standard care and if he had any further questions he may call back.

## 2018-10-07 NOTE — Progress Notes (Signed)
Verbally read [facility]'s contract.  Patient verbally agreed to all terms of the [facility] contract.   Patient to provide last 30 days of paystubs. Patient verbally acknowledged that he understood that failure to provide this financial documentation could result in his ability to obtain medication assistance from [facility].    Provided patient with Civil engineer, contracting based on his particular needs.    [facility]

## 2018-10-07 NOTE — Telephone Encounter (Signed)
Patient called and wanted to know what he can do if his sugar drops while on the liquid diet for surgery

## 2018-10-08 ENCOUNTER — Other Ambulatory Visit: Payer: Self-pay

## 2018-10-08 ENCOUNTER — Encounter
Admission: RE | Admit: 2018-10-08 | Discharge: 2018-10-08 | Disposition: A | Discharge status: Home / Self Care | Payer: Self-pay | Source: Ambulatory Visit | Attending: Surgery | Admitting: Surgery

## 2018-10-08 HISTORY — DX: Anxiety disorder, unspecified: F41.9

## 2018-10-08 NOTE — Patient Instructions (Signed)
Your procedure is scheduled on:Tomorrow.  Arrive at 10:00 Report to Day Surgery.  Remember: Instructions that are not followed completely may result in serious medical risk,  up to and including death, or upon the discretion of your surgeon and anesthesiologist your  surgery may need to be rescheduled.     _X__ 1. Do not eat food after midnight the night before your procedure.                 No gum chewing or hard candies. You may drink clear liquids up to 2 hours                 before you are scheduled to arrive for your surgery- DO not drink clear                 liquids within 2 hours of the start of your surgery.                 Clear Liquids include:  water, apple juice without pulp, clear carbohydrate                 drink such as Clearfast of Gatorade, Black Coffee or Tea (Do not add                 anything to coffee or tea).  __X__2.  On the morning of surgery brush your teeth with toothpaste and water, you                may rinse your mouth with mouthwash if you wish.  Do not swallow any toothpaste of mouthwash.     _X__ 3.  No Alcohol for 24 hours before or after surgery.   _X__ 4.  Do Not Smoke or use e-cigarettes For 24 Hours Prior to Your Surgery.                 Do not use any chewable tobacco products for at least 6 hours prior to                 surgery.  ____  5.  Bring all medications with you on the day of surgery if instructed.   ___x_  6.  Notify your doctor if there is any change in your medical condition      (cold, fever, infections).     Do not wear jewelry, make-up, hairpins, clips or nail polish. Do not wear lotions, powders, or perfumes. You may wear deodorant. Do not shave 48 hours prior to surgery. Men may shave face and neck. Do not bring valuables to the hospital.    [facility] is not responsible for any belongings or valuables.  Contacts, dentures or bridgework may not be worn into surgery. Leave your suitcase in the  car. After surgery it may be brought to your room. For patients admitted to the hospital, discharge time is determined by your treatment team.   Patients discharged the day of surgery will not be allowed to drive home.   Please read over the following fact sheets that you were given:  x  ____ Take these medicines the morning of surgery with A SIP OF WATER:    1. Bowel prep as ordered  2.   3.   4.  5.  6.  ____ Fleet Enema (as directed)   ____ Use CHG Soap as directed  ____ Use inhalers on the day of surgery  ____ Stop metformin 2 days prior to surgery  ____ Take 1/2 of usual insulin dose the night before surgery. No insulin the morning          of surgery.   ____ Stop Coumadin/Plavix/aspirin on   ____ Stop Anti-inflammatories on    ____ Stop supplements until after surgery.    ____ Bring C-Pap to the hospital.

## 2018-10-09 ENCOUNTER — Encounter
Admission: RE | Disposition: A | Discharge status: Home / Self Care | Payer: Self-pay | Source: Home / Self Care | Attending: Surgery

## 2018-10-09 ENCOUNTER — Encounter: Payer: Self-pay | Admitting: *Deleted

## 2018-10-09 ENCOUNTER — Inpatient Hospital Stay
Admission: RE | Admit: 2018-10-09 | Discharge: 2018-10-13 | DRG: 331 | Disposition: A | Discharge status: Home / Self Care | Payer: Self-pay | Attending: Surgery | Admitting: Surgery

## 2018-10-09 ENCOUNTER — Ambulatory Visit: Payer: Self-pay | Admitting: Anesthesiology

## 2018-10-09 DIAGNOSIS — K37 Unspecified appendicitis: Secondary | ICD-10-CM | POA: Diagnosis present

## 2018-10-09 DIAGNOSIS — F17210 Nicotine dependence, cigarettes, uncomplicated: Secondary | ICD-10-CM | POA: Diagnosis present

## 2018-10-09 DIAGNOSIS — K3532 Acute appendicitis with perforation and localized peritonitis, without abscess: Secondary | ICD-10-CM

## 2018-10-09 DIAGNOSIS — Z5331 Laparoscopic surgical procedure converted to open procedure: Secondary | ICD-10-CM

## 2018-10-09 DIAGNOSIS — K3533 Acute appendicitis with perforation and localized peritonitis, with abscess: Principal | ICD-10-CM | POA: Diagnosis present

## 2018-10-09 HISTORY — PX: LAPAROSCOPIC APPENDECTOMY: SHX408

## 2018-10-09 HISTORY — DX: Acute appendicitis with perforation, localized peritonitis, and gangrene, without abscess: K35.32

## 2018-10-09 HISTORY — PX: PARTIAL COLECTOMY: SHX5273

## 2018-10-09 LAB — URINE DRUG SCREEN, QUALITATIVE (ARMC ONLY)
Amphetamines, Ur Screen: NOT DETECTED
Barbiturates, Ur Screen: NOT DETECTED
Benzodiazepine, Ur Scrn: POSITIVE — AB
Cannabinoid 50 Ng, Ur ~~LOC~~: POSITIVE — AB
Cocaine Metabolite,Ur ~~LOC~~: NOT DETECTED
MDMA (Ecstasy)Ur Screen: NOT DETECTED
Methadone Scn, Ur: NOT DETECTED
Opiate, Ur Screen: NOT DETECTED
Phencyclidine (PCP) Ur S: NOT DETECTED
Tricyclic, Ur Screen: NOT DETECTED

## 2018-10-09 SURGERY — APPENDECTOMY, LAPAROSCOPIC
Anesthesia: General | Laterality: Right

## 2018-10-09 MED ORDER — SODIUM CHLORIDE (PF) 0.9 % IJ SOLN
INTRAMUSCULAR | Status: AC
  Filled 2018-10-09: qty 50

## 2018-10-09 MED ORDER — FAMOTIDINE 20 MG PO TABS
20.0000 mg | ORAL_TABLET | Freq: Once | ORAL | Status: AC
  Administered 2018-10-09: 20 mg via ORAL

## 2018-10-09 MED ORDER — BUPIVACAINE LIPOSOME 1.3 % IJ SUSP: 20.0000 mL | Freq: Once | INTRAMUSCULAR | Status: DC

## 2018-10-09 MED ORDER — CYCLOBENZAPRINE HCL 10 MG PO TABS
5.0000 mg | ORAL_TABLET | Freq: Three times a day (TID) | ORAL | Status: DC
  Administered 2018-10-09 – 2018-10-13 (×11): 5 mg via ORAL
  Filled 2018-10-09 (×11): qty 1

## 2018-10-09 MED ORDER — SUGAMMADEX SODIUM 200 MG/2ML IV SOLN
INTRAVENOUS | Status: DC | PRN
  Administered 2018-10-09: 400 mg via INTRAVENOUS

## 2018-10-09 MED ORDER — DEXAMETHASONE SODIUM PHOSPHATE 10 MG/ML IJ SOLN
INTRAMUSCULAR | Status: AC
  Filled 2018-10-09: qty 1

## 2018-10-09 MED ORDER — ONDANSETRON HCL 4 MG/2ML IJ SOLN
INTRAMUSCULAR | Status: DC | PRN
  Administered 2018-10-09: 4 mg via INTRAVENOUS

## 2018-10-09 MED ORDER — MIDAZOLAM HCL 2 MG/2ML IJ SOLN
INTRAMUSCULAR | Status: DC | PRN
  Administered 2018-10-09: 2 mg via INTRAVENOUS

## 2018-10-09 MED ORDER — CLINDAMYCIN PHOSPHATE 900 MG/50ML IV SOLN
INTRAVENOUS | Status: AC
  Filled 2018-10-09: qty 50

## 2018-10-09 MED ORDER — POTASSIUM CHLORIDE IN NACL 20-0.9 MEQ/L-% IV SOLN
INTRAVENOUS | Status: DC
  Administered 2018-10-09 – 2018-10-13 (×8): via INTRAVENOUS
  Filled 2018-10-09 (×8): qty 1000

## 2018-10-09 MED ORDER — SODIUM CHLORIDE (PF) 0.9 % IJ SOLN
INTRAMUSCULAR | Status: DC | PRN
  Administered 2018-10-09: 50 mL

## 2018-10-09 MED ORDER — FENTANYL CITRATE (PF) 100 MCG/2ML IJ SOLN
INTRAMUSCULAR | Status: AC
  Filled 2018-10-09: qty 2

## 2018-10-09 MED ORDER — OXYCODONE HCL 5 MG/5ML PO SOLN: 5.0000 mg | Freq: Once | ORAL | Status: DC | PRN

## 2018-10-09 MED ORDER — PANTOPRAZOLE SODIUM 40 MG IV SOLR
40.0000 mg | Freq: Every day | INTRAVENOUS | Status: DC
  Administered 2018-10-09 – 2018-10-12 (×4): 40 mg via INTRAVENOUS
  Filled 2018-10-09 (×4): qty 40

## 2018-10-09 MED ORDER — EPHEDRINE SULFATE 50 MG/ML IJ SOLN
INTRAMUSCULAR | Status: AC
  Filled 2018-10-09: qty 1

## 2018-10-09 MED ORDER — GABAPENTIN 600 MG PO TABS
600.0000 mg | ORAL_TABLET | Freq: Three times a day (TID) | ORAL | Status: DC
  Administered 2018-10-09 – 2018-10-13 (×11): 600 mg via ORAL
  Filled 2018-10-09 (×11): qty 1

## 2018-10-09 MED ORDER — DEXMEDETOMIDINE HCL IN NACL 200 MCG/50ML IV SOLN
INTRAVENOUS | Status: DC | PRN
  Administered 2018-10-09 (×2): 8 ug via INTRAVENOUS
  Administered 2018-10-09: 4 ug via INTRAVENOUS

## 2018-10-09 MED ORDER — EPHEDRINE SULFATE 50 MG/ML IJ SOLN
INTRAMUSCULAR | Status: DC | PRN
  Administered 2018-10-09: 5 mg via INTRAVENOUS
  Administered 2018-10-09: 10 mg via INTRAVENOUS

## 2018-10-09 MED ORDER — CHLORHEXIDINE GLUCONATE CLOTH 2 % EX PADS
6.0000 | MEDICATED_PAD | Freq: Once | CUTANEOUS | Status: AC
  Administered 2018-10-09: 6 via TOPICAL

## 2018-10-09 MED ORDER — ONDANSETRON 4 MG PO TBDP: 4.0000 mg | ORAL_TABLET | Freq: Four times a day (QID) | ORAL | Status: DC | PRN

## 2018-10-09 MED ORDER — PROPOFOL 10 MG/ML IV BOLUS
INTRAVENOUS | Status: AC
  Filled 2018-10-09: qty 40

## 2018-10-09 MED ORDER — PROPOFOL 10 MG/ML IV BOLUS
INTRAVENOUS | Status: AC
  Filled 2018-10-09: qty 20

## 2018-10-09 MED ORDER — ONDANSETRON HCL 4 MG/2ML IJ SOLN
INTRAMUSCULAR | Status: AC
  Filled 2018-10-09: qty 2

## 2018-10-09 MED ORDER — PROCHLORPERAZINE MALEATE 10 MG PO TABS
10.0000 mg | ORAL_TABLET | Freq: Four times a day (QID) | ORAL | Status: DC | PRN
  Filled 2018-10-09: qty 1

## 2018-10-09 MED ORDER — DEXAMETHASONE SODIUM PHOSPHATE 10 MG/ML IJ SOLN
INTRAMUSCULAR | Status: DC | PRN
  Administered 2018-10-09: 10 mg via INTRAVENOUS

## 2018-10-09 MED ORDER — OXYCODONE HCL 5 MG PO TABS
5.0000 mg | ORAL_TABLET | ORAL | Status: DC | PRN
  Administered 2018-10-09 – 2018-10-10 (×7): 10 mg via ORAL
  Administered 2018-10-11: 04:00:00 5 mg via ORAL
  Administered 2018-10-11 – 2018-10-13 (×11): 10 mg via ORAL
  Filled 2018-10-09 (×8): qty 2
  Filled 2018-10-09: qty 1
  Filled 2018-10-09 (×10): qty 2

## 2018-10-09 MED ORDER — PROPOFOL 10 MG/ML IV BOLUS
INTRAVENOUS | Status: DC | PRN
  Administered 2018-10-09: 200 mg via INTRAVENOUS
  Administered 2018-10-09: 100 mg via INTRAVENOUS

## 2018-10-09 MED ORDER — PROMETHAZINE HCL 25 MG/ML IJ SOLN: 6.2500 mg | INTRAMUSCULAR | Status: DC | PRN

## 2018-10-09 MED ORDER — LIDOCAINE HCL (CARDIAC) PF 100 MG/5ML IV SOSY
PREFILLED_SYRINGE | INTRAVENOUS | Status: DC | PRN
  Administered 2018-10-09: 100 mg via INTRAVENOUS

## 2018-10-09 MED ORDER — ONDANSETRON HCL 4 MG/2ML IJ SOLN
4.0000 mg | Freq: Four times a day (QID) | INTRAMUSCULAR | Status: DC | PRN
  Administered 2018-10-11: 4 mg via INTRAVENOUS
  Filled 2018-10-09: qty 2

## 2018-10-09 MED ORDER — ACETAMINOPHEN 500 MG PO TABS
1000.0000 mg | ORAL_TABLET | ORAL | Status: AC
  Administered 2018-10-09: 1000 mg via ORAL

## 2018-10-09 MED ORDER — SODIUM CHLORIDE 0.9 % IV SOLN
INTRAVENOUS | Status: DC | PRN
  Administered 2018-10-09 – 2018-10-10 (×2): 250 mL via INTRAVENOUS

## 2018-10-09 MED ORDER — PROCHLORPERAZINE EDISYLATE 10 MG/2ML IJ SOLN
5.0000 mg | Freq: Four times a day (QID) | INTRAMUSCULAR | Status: DC | PRN
  Filled 2018-10-09: qty 2

## 2018-10-09 MED ORDER — OXYCODONE HCL 5 MG PO TABS: 5.0000 mg | ORAL_TABLET | Freq: Once | ORAL | Status: DC | PRN

## 2018-10-09 MED ORDER — GENTAMICIN SULFATE 40 MG/ML IJ SOLN
5.0000 mg/kg | INTRAVENOUS | Status: AC
  Administered 2018-10-09: 410 mg via INTRAVENOUS
  Filled 2018-10-09: qty 10.25

## 2018-10-09 MED ORDER — MEPERIDINE HCL 50 MG/ML IJ SOLN: 6.2500 mg | INTRAMUSCULAR | Status: DC | PRN

## 2018-10-09 MED ORDER — SODIUM CHLORIDE 0.9 % IV SOLN
2.0000 g | Freq: Three times a day (TID) | INTRAVENOUS | Status: DC
  Administered 2018-10-09 – 2018-10-10 (×2): 2 g via INTRAVENOUS
  Filled 2018-10-09 (×5): qty 2

## 2018-10-09 MED ORDER — ACETAMINOPHEN 500 MG PO TABS
1000.0000 mg | ORAL_TABLET | Freq: Four times a day (QID) | ORAL | Status: DC
  Administered 2018-10-09 – 2018-10-13 (×13): 1000 mg via ORAL
  Filled 2018-10-09 (×14): qty 2

## 2018-10-09 MED ORDER — ROCURONIUM BROMIDE 50 MG/5ML IV SOLN
INTRAVENOUS | Status: AC
  Filled 2018-10-09: qty 1

## 2018-10-09 MED ORDER — KETOROLAC TROMETHAMINE 30 MG/ML IJ SOLN
INTRAMUSCULAR | Status: DC | PRN
  Administered 2018-10-09: 30 mg via INTRAVENOUS

## 2018-10-09 MED ORDER — FENTANYL CITRATE (PF) 100 MCG/2ML IJ SOLN
25.0000 ug | INTRAMUSCULAR | Status: DC | PRN
  Administered 2018-10-09 (×3): 50 ug via INTRAVENOUS

## 2018-10-09 MED ORDER — ACETAMINOPHEN 325 MG PO TABS: 325.0000 mg | ORAL_TABLET | ORAL | Status: DC | PRN

## 2018-10-09 MED ORDER — PROPOFOL 500 MG/50ML IV EMUL
INTRAVENOUS | Status: DC | PRN
  Administered 2018-10-09: 20 ug/kg/min via INTRAVENOUS

## 2018-10-09 MED ORDER — ROCURONIUM BROMIDE 100 MG/10ML IV SOLN
INTRAVENOUS | Status: DC | PRN
  Administered 2018-10-09 (×2): 20 mg via INTRAVENOUS
  Administered 2018-10-09: 30 mg via INTRAVENOUS
  Administered 2018-10-09: 50 mg via INTRAVENOUS

## 2018-10-09 MED ORDER — FAMOTIDINE 20 MG PO TABS
ORAL_TABLET | ORAL | Status: AC
  Administered 2018-10-09: 11:00:00 20 mg via ORAL
  Filled 2018-10-09: qty 1

## 2018-10-09 MED ORDER — BUPIVACAINE LIPOSOME 1.3 % IJ SUSP
INTRAMUSCULAR | Status: AC
  Filled 2018-10-09: qty 20

## 2018-10-09 MED ORDER — SUCCINYLCHOLINE CHLORIDE 20 MG/ML IJ SOLN
INTRAMUSCULAR | Status: DC | PRN
  Administered 2018-10-09: 120 mg via INTRAVENOUS

## 2018-10-09 MED ORDER — ACETAMINOPHEN 10 MG/ML IV SOLN
INTRAVENOUS | Status: AC
  Filled 2018-10-09: qty 100

## 2018-10-09 MED ORDER — HYDROMORPHONE HCL 1 MG/ML IJ SOLN
1.0000 mg | INTRAMUSCULAR | Status: DC | PRN
  Administered 2018-10-09 – 2018-10-11 (×10): 1 mg via INTRAVENOUS
  Filled 2018-10-09 (×10): qty 1

## 2018-10-09 MED ORDER — BUPIVACAINE-EPINEPHRINE 0.25% -1:200000 IJ SOLN
INTRAMUSCULAR | Status: DC | PRN
  Administered 2018-10-09: 30 mL

## 2018-10-09 MED ORDER — ACETAMINOPHEN 500 MG PO TABS
ORAL_TABLET | ORAL | Status: AC
  Administered 2018-10-09: 11:00:00 1000 mg via ORAL
  Filled 2018-10-09: qty 2

## 2018-10-09 MED ORDER — METRONIDAZOLE IN NACL 5-0.79 MG/ML-% IV SOLN
500.0000 mg | Freq: Three times a day (TID) | INTRAVENOUS | Status: DC
  Filled 2018-10-09 (×6): qty 100

## 2018-10-09 MED ORDER — ALPRAZOLAM 0.5 MG PO TABS
0.5000 mg | ORAL_TABLET | Freq: Three times a day (TID) | ORAL | Status: DC | PRN
  Administered 2018-10-10 – 2018-10-13 (×3): 0.5 mg via ORAL
  Filled 2018-10-09 (×3): qty 1

## 2018-10-09 MED ORDER — GENTAMICIN SULFATE 40 MG/ML IJ SOLN
5.0000 mg/kg | INTRAVENOUS | Status: DC
  Filled 2018-10-09: qty 12

## 2018-10-09 MED ORDER — BUPIVACAINE LIPOSOME 1.3 % IJ SUSP
INTRAMUSCULAR | Status: DC | PRN
  Administered 2018-10-09: 20 mL

## 2018-10-09 MED ORDER — CLINDAMYCIN PHOSPHATE 900 MG/50ML IV SOLN
900.0000 mg | INTRAVENOUS | Status: AC
  Administered 2018-10-09: 900 mg via INTRAVENOUS

## 2018-10-09 MED ORDER — CLINDAMYCIN PHOSPHATE 900 MG/50ML IV SOLN: 900.0000 mg | INTRAVENOUS | Status: DC

## 2018-10-09 MED ORDER — EVICEL 5 ML EX KIT
PACK | CUTANEOUS | Status: AC
  Filled 2018-10-09: qty 1

## 2018-10-09 MED ORDER — SODIUM CHLORIDE 0.9 % IV SOLN
2.0000 g | Freq: Three times a day (TID) | INTRAVENOUS | Status: DC
  Filled 2018-10-09 (×7): qty 2

## 2018-10-09 MED ORDER — LIDOCAINE HCL (PF) 2 % IJ SOLN
INTRAMUSCULAR | Status: AC
  Filled 2018-10-09: qty 10

## 2018-10-09 MED ORDER — ROCURONIUM BROMIDE 50 MG/5ML IV SOLN
INTRAVENOUS | Status: AC
  Filled 2018-10-09: qty 2

## 2018-10-09 MED ORDER — EVICEL 5 ML EX KIT
PACK | CUTANEOUS | Status: DC | PRN
  Administered 2018-10-09: 5 mL

## 2018-10-09 MED ORDER — BUPIVACAINE-EPINEPHRINE (PF) 0.25% -1:200000 IJ SOLN
INTRAMUSCULAR | Status: AC
  Filled 2018-10-09: qty 30

## 2018-10-09 MED ORDER — LACTATED RINGERS IV SOLN
INTRAVENOUS | Status: DC
  Administered 2018-10-09 (×3): via INTRAVENOUS

## 2018-10-09 MED ORDER — PHENYLEPHRINE HCL (PRESSORS) 10 MG/ML IV SOLN
INTRAVENOUS | Status: DC | PRN
  Administered 2018-10-09 (×2): 100 ug via INTRAVENOUS

## 2018-10-09 MED ORDER — SUGAMMADEX SODIUM 200 MG/2ML IV SOLN
INTRAVENOUS | Status: AC
  Filled 2018-10-09: qty 4

## 2018-10-09 MED ORDER — HYDRALAZINE HCL 20 MG/ML IJ SOLN: 10.0000 mg | INTRAMUSCULAR | Status: DC | PRN

## 2018-10-09 MED ORDER — FENTANYL CITRATE (PF) 250 MCG/5ML IJ SOLN
INTRAMUSCULAR | Status: AC
  Filled 2018-10-09: qty 5

## 2018-10-09 MED ORDER — FENTANYL CITRATE (PF) 100 MCG/2ML IJ SOLN
INTRAMUSCULAR | Status: DC | PRN
  Administered 2018-10-09 (×2): 50 ug via INTRAVENOUS
  Administered 2018-10-09: 150 ug via INTRAVENOUS
  Administered 2018-10-09 (×4): 50 ug via INTRAVENOUS

## 2018-10-09 MED ORDER — ACETAMINOPHEN 10 MG/ML IV SOLN
INTRAVENOUS | Status: DC | PRN
  Administered 2018-10-09: 1000 mg via INTRAVENOUS

## 2018-10-09 MED ORDER — SEVOFLURANE IN SOLN
RESPIRATORY_TRACT | Status: AC
  Filled 2018-10-09: qty 250

## 2018-10-09 MED ORDER — METRONIDAZOLE IN NACL 5-0.79 MG/ML-% IV SOLN
500.0000 mg | Freq: Three times a day (TID) | INTRAVENOUS | Status: DC
  Administered 2018-10-09 – 2018-10-10 (×2): 500 mg via INTRAVENOUS
  Filled 2018-10-09 (×5): qty 100

## 2018-10-09 MED ORDER — GENTAMICIN SULFATE 40 MG/ML IJ SOLN
7.0000 mg/kg | INTRAVENOUS | Status: DC
  Filled 2018-10-09 (×2): qty 14.5

## 2018-10-09 MED ORDER — KETOROLAC TROMETHAMINE 30 MG/ML IJ SOLN
INTRAMUSCULAR | Status: AC
  Filled 2018-10-09: qty 1

## 2018-10-09 MED ORDER — CHLORHEXIDINE GLUCONATE CLOTH 2 % EX PADS: 6.0000 | MEDICATED_PAD | Freq: Once | CUTANEOUS | Status: DC

## 2018-10-09 MED ORDER — MIDAZOLAM HCL 2 MG/2ML IJ SOLN
INTRAMUSCULAR | Status: AC
  Filled 2018-10-09: qty 2

## 2018-10-09 MED ORDER — SUCCINYLCHOLINE CHLORIDE 20 MG/ML IJ SOLN
INTRAMUSCULAR | Status: AC
  Filled 2018-10-09: qty 1

## 2018-10-09 MED ORDER — GABAPENTIN 300 MG PO CAPS
ORAL_CAPSULE | ORAL | Status: AC
  Administered 2018-10-09: 11:00:00 300 mg via ORAL
  Filled 2018-10-09: qty 1

## 2018-10-09 MED ORDER — ACETAMINOPHEN 160 MG/5ML PO SOLN
325.0000 mg | ORAL | Status: DC | PRN
  Filled 2018-10-09: qty 20.3

## 2018-10-09 MED ORDER — FENTANYL CITRATE (PF) 100 MCG/2ML IJ SOLN
INTRAMUSCULAR | Status: AC
  Administered 2018-10-09: 17:00:00 50 ug via INTRAVENOUS
  Filled 2018-10-09: qty 2

## 2018-10-09 MED ORDER — GABAPENTIN 300 MG PO CAPS
300.0000 mg | ORAL_CAPSULE | ORAL | Status: AC
  Administered 2018-10-09: 300 mg via ORAL

## 2018-10-09 SURGICAL SUPPLY — 58 items
APPLIER CLIP 5 13 M/L LIGAMAX5 (MISCELLANEOUS)
BLADE CLIPPER SURG (BLADE) ×8 IMPLANT
BULB RESERV EVAC DRAIN JP 100C (MISCELLANEOUS) ×4 IMPLANT
CANISTER SUCT 1200ML W/VALVE (MISCELLANEOUS) ×4 IMPLANT
CHLORAPREP W/TINT 26 (MISCELLANEOUS) ×4 IMPLANT
CLIP APPLIE 5 13 M/L LIGAMAX5 (MISCELLANEOUS) IMPLANT
COVER WAND RF STERILE (DRAPES) ×4 IMPLANT
CUTTER FLEX LINEAR 45M (STAPLE) IMPLANT
DERMABOND ADVANCED (GAUZE/BANDAGES/DRESSINGS) ×2
DERMABOND ADVANCED .7 DNX12 (GAUZE/BANDAGES/DRESSINGS) ×2 IMPLANT
DRAIN CHANNEL JP 19F (MISCELLANEOUS) ×4 IMPLANT
DRAIN PENROSE 5/8X18 LTX STRL (WOUND CARE) ×4 IMPLANT
DRSG OPSITE POSTOP 4X8 (GAUZE/BANDAGES/DRESSINGS) ×4 IMPLANT
DRSG TEGADERM 2-3/8X2-3/4 SM (GAUZE/BANDAGES/DRESSINGS) ×12 IMPLANT
DRSG TEGADERM 4X4.75 (GAUZE/BANDAGES/DRESSINGS) ×4 IMPLANT
ELECT CAUTERY BLADE 6.4 (BLADE) ×4 IMPLANT
ELECT REM PT RETURN 9FT ADLT (ELECTROSURGICAL) ×4
ELECTRODE REM PT RTRN 9FT ADLT (ELECTROSURGICAL) ×2 IMPLANT
GLOVE BIO SURGEON STRL SZ7 (GLOVE) ×16 IMPLANT
GOWN STRL REUS W/ TWL LRG LVL3 (GOWN DISPOSABLE) ×6 IMPLANT
GOWN STRL REUS W/TWL LRG LVL3 (GOWN DISPOSABLE) ×6
IRRIGATION STRYKERFLOW (MISCELLANEOUS) ×2 IMPLANT
IRRIGATOR STRYKERFLOW (MISCELLANEOUS) ×4
IV NS 1000ML (IV SOLUTION) ×2
IV NS 1000ML BAXH (IV SOLUTION) ×2 IMPLANT
NEEDLE HYPO 22GX1.5 SAFETY (NEEDLE) ×4 IMPLANT
NS IRRIG 500ML POUR BTL (IV SOLUTION) ×4 IMPLANT
PACK COLON CLEAN CLOSURE (MISCELLANEOUS) ×4 IMPLANT
PACK LAP CHOLECYSTECTOMY (MISCELLANEOUS) ×4 IMPLANT
PENCIL ELECTRO HAND CTR (MISCELLANEOUS) ×4 IMPLANT
POUCH SPECIMEN RETRIEVAL 10MM (ENDOMECHANICALS) ×4 IMPLANT
RELOAD 45 VASCULAR/THIN (ENDOMECHANICALS) IMPLANT
RELOAD PROXIMATE 75MM BLUE (ENDOMECHANICALS) ×12 IMPLANT
RELOAD STAPLE TA45 3.5 REG BLU (ENDOMECHANICALS) IMPLANT
SCISSORS METZENBAUM CVD 33 (INSTRUMENTS) IMPLANT
SHEARS HARMONIC ACE PLUS 36CM (ENDOMECHANICALS) ×4 IMPLANT
SLEEVE ENDOPATH XCEL 5M (ENDOMECHANICALS) ×4 IMPLANT
SPONGE DRAIN TRACH 4X4 STRL 2S (GAUZE/BANDAGES/DRESSINGS) ×4 IMPLANT
SPONGE GAUZE 2X2 8PLY STER LF (GAUZE/BANDAGES/DRESSINGS) ×2
SPONGE GAUZE 2X2 8PLY STRL LF (GAUZE/BANDAGES/DRESSINGS) ×6 IMPLANT
SPONGE LAP 18X18 RF (DISPOSABLE) ×12 IMPLANT
SPONGE LAP 4X18 RFD (DISPOSABLE) ×8 IMPLANT
STAPLER PROXIMATE 75MM BLUE (STAPLE) ×4 IMPLANT
STAPLER SKIN PROX 35W (STAPLE) ×4 IMPLANT
SUT MNCRL AB 4-0 PS2 18 (SUTURE) ×8 IMPLANT
SUT PDS AB 0 CT1 27 (SUTURE) ×8 IMPLANT
SUT SILK 2 0 (SUTURE) ×2
SUT SILK 2 0 SH CR/8 (SUTURE) ×8 IMPLANT
SUT SILK 2 0SH CR/8 30 (SUTURE) ×8 IMPLANT
SUT SILK 2-0 30XBRD TIE 12 (SUTURE) ×2 IMPLANT
SUT VICRYL 0 AB UR-6 (SUTURE) ×4 IMPLANT
SYR 20CC LL (SYRINGE) ×16 IMPLANT
SYS LAPSCP GELPORT 120MM (MISCELLANEOUS) ×4
SYSTEM LAPSCP GELPORT 120MM (MISCELLANEOUS) ×2 IMPLANT
TRAY FOLEY MTR SLVR 16FR STAT (SET/KITS/TRAYS/PACK) ×4 IMPLANT
TROCAR XCEL BLUNT TIP 100MML (ENDOMECHANICALS) ×4 IMPLANT
TROCAR XCEL NON-BLD 5MMX100MML (ENDOMECHANICALS) ×4 IMPLANT
TUBING EVAC SMOKE HEATED PNEUM (TUBING) ×4 IMPLANT

## 2018-10-09 NOTE — Interval H&P Note (Signed)
History and Physical Interval Note:  10/09/2018 11:01 AM  Vernon Allen  has presented today for surgery, with the diagnosis of K35.32 APPENDICITIS.  The various methods of treatment have been discussed with the patient and family. After consideration of risks, benefits and other options for treatment, the patient has consented to  Procedure(s): APPENDECTOMY LAPAROSCOPIC, POSSIBLE RIGHT COLON (N/A) as a surgical intervention.  The patient's history has been reviewed, patient examined, no change in status, stable for surgery.  I have reviewed the patient's chart and labs.  Questions were answered to the patient's satisfaction.     [place]

## 2018-10-09 NOTE — Anesthesia Postprocedure Evaluation (Signed)
Anesthesia Post Note  Patient: HEKTOR HUSTON  Procedure(s) Performed: APPENDECTOMY LAPAROSCOPIC ATTEMPTED (N/A ) PARTIAL COLECTOMY (Right )  Patient location during evaluation: PACU Anesthesia Type: General Level of consciousness: awake and alert Pain management: pain level controlled Vital Signs Assessment: post-procedure vital signs reviewed and stable Respiratory status: spontaneous breathing, nonlabored ventilation, respiratory function stable and patient connected to nasal cannula oxygen Cardiovascular status: blood pressure returned to baseline and stable Postop Assessment: no apparent nausea or vomiting Anesthetic complications: no     Last Vitals:  Vitals:   10/09/18 1739 10/09/18 1803  BP: (!) 133/93 132/87  Pulse: 92 87  Resp: (!) 9 18  Temp: (!) 36.2 C 36.5 C  SpO2: 97% 96%    Last Pain:  Vitals:   10/09/18 1803  TempSrc: Oral  PainSc:                  Molli Barrows

## 2018-10-09 NOTE — Anesthesia Post-op Follow-up Note (Signed)
Anesthesia QCDR form completed.        

## 2018-10-09 NOTE — Anesthesia Procedure Notes (Signed)
Procedure Name: Intubation Date/Time: 10/09/2018 1:15 PM Performed by: Lavone Orn, CRNA Pre-anesthesia Checklist: Emergency Drugs available, Suction available, Patient identified, Patient being monitored and Timeout performed Patient Re-evaluated:Patient Re-evaluated prior to induction Oxygen Delivery Method: Circle system utilized Preoxygenation: Pre-oxygenation with 100% oxygen Induction Type: IV induction Ventilation: Mask ventilation without difficulty and Oral airway inserted - appropriate to patient size Laryngoscope Size: Mac and 4 Grade View: Grade I Tube type: Oral Tube size: 7.5 mm Number of attempts: 1 Airway Equipment and Method: Stylet Placement Confirmation: ETT inserted through vocal cords under direct vision,  positive ETCO2 and breath sounds checked- equal and bilateral Secured at: 23 cm Tube secured with: Tape Dental Injury: Teeth and Oropharynx as per pre-operative assessment

## 2018-10-09 NOTE — Transfer of Care (Signed)
Immediate Anesthesia Transfer of Care Note  Patient: Vernon Allen  Procedure(s) Performed: APPENDECTOMY LAPAROSCOPIC ATTEMPTED (N/A ) PARTIAL COLECTOMY (Right )  Patient Location: PACU  Anesthesia Type:General  Level of Consciousness: awake and patient cooperative  Airway & Oxygen Therapy: Patient Spontanous Breathing and Patient connected to face mask  Post-op Assessment: Report given to RN and Post -op Vital signs reviewed and stable  Post vital signs: stable  Last Vitals:  Vitals Value Taken Time  BP 142/83 10/09/18 1654  Temp 36.4 C 10/09/18 1654  Pulse 100 10/09/18 1700  Resp 10 10/09/18 1700  SpO2 100 % 10/09/18 1700  Vitals shown include unvalidated device data.  Last Pain:  Vitals:   10/09/18 1654  TempSrc:   PainSc: Asleep         Complications: No apparent anesthesia complications

## 2018-10-09 NOTE — Op Note (Signed)
Attempted laparascopic appendectomy and conversion to open right hemicolectomy  Fenton MallingMichael L Delduca Date of operation:  10/09/2018  Indications: The patient presented with a history of  abdominal pain.  Previous perforated appendicitis and abscess status post percutaneous drain placement on failure outpatient management with antibiotics.  Patient with persistent symptoms and persistent appendicitis clinically and on CT scan  Pre-operative Diagnosis: Appendicitis, unqualified  Post-operative Diagnosis: Same  Surgeon: Sterling Bigiego Pabon, MD, FACS  Anesthesia: General with endotracheal tube  Findings: Appendicitis with phlegmon involving the cecum.  There was a contained perforation.  The severity of the inflammatory response within the cecum required a right hemicolectomy.  Estimated Blood Loss: 100cc         Specimens: Right colon         Complications:  Right colon  Procedure Details  The patient was seen again in the preop area. The options of surgery versus observation were reviewed with the patient and/or family. The risks of bleeding, infection, recurrence of symptoms, negative laparoscopy, potential for an open procedure, bowel injury, abscess or infection, were all reviewed as well. The patient was taken to Operating Room, identified as Fenton MallingMichael L Flahive and the procedure verified as laparoscopic appendectomy. A Time Out was held and the above information confirmed.  The patient was placed in the supine position and general anesthesia was induced.  Antibiotic prophylaxis was administered and VT E prophylaxis was in place. A Foley catheter was placed by the nursing staff.   The abdomen was prepped and draped in a sterile fashion. An infraumbilical incision was made. A cutdown technique was used to enter the abdominal cavity. Two vicryl stitches were placed on the fascia and a Hasson trocar inserted. Pneumoperitoneum obtained. Two 5 mm ports were placed under direct visualization.  Our  dissection and visualize significant inflammatory response around the appendix and cecum.  I was able to take down the white line of [place] laterally.  There was significant inflammatory response of very difficult case.  Laparoscopically.  At this time I probably decided to do a hand-assisted GelPort and increased my incision.  The adhesions were blunt with finger fracturing I was able to mobilize the right colon from lateral to medial using the harmonic scalpel.  Attention then was turned to the hepatic flexure where I was also able to take down the flexure laparoscopically.  The mesentery there was significant inflammatory response and very difficult dissection and at this time there was some oozing and therefore I decided to convert for laparotomy.  Were able to control the bleeding from the mesentery promptly using multiple figure-of-eight 2-0 suture ligature.  Attention then was turned to the terminal ileum where a window was created and the terminal ileum was divided approximately 5 cm from the ileocecal valve.  We continue to mobilize the colon from lateral to medial and also identified a good segment to the right of the middle colic artery and divided the transverse colons at this time.  Origins were free of disease.  There was significant phlegmon and inflammatory response both around the appendix and cecum with a contained perforation.  The mesentery was divided between clamps and multiple suture ligatures were used to obtain good hemostasis.  The specimen was sent for permanent pathology.  Standard side-to-side functional end-to-end staple anastomosis was performed.  There was good hemostasis there was no stricture there was no evidence of intra-operative leak and it was widely patent.  Because of the mesentery was very wide I decided not to close the  mesentery. Also place EvaSeal to reinforce the anastomosis.  [place] drain was placed in the right lower quadrant and pelvis given the severe  inflammatory response.   Changed gloves scrubbed out and place a clean closure tray.  Liposomal Marcaine was infiltrated through abdominal wall using a T AP block bilaterally.  The fascia was closed with a continuous 0 PDS suture in the standard fashion. I closed the skin over a Penrose drain with a staples.  Old incisions were closed with staples.  The patient tolerated the procedure well, there were no complications. The sponge lap and needle count were correct at the end of the procedure.  The patient was taken to the recovery room in stable condition to be admitted for continued care.    Caroleen Hamman, MD FACS

## 2018-10-09 NOTE — Consult Note (Signed)
Pharmacy Antibiotic Note  Vernon Allen is a 38 y.o. male admitted on 10/09/2018 with intra-abdominal infection s/p appendectomy.    Pharmacy has been consulted for gentamycin dosing.  Pt received 1 dose Gentamycin 410mg  6/18 @ 1330 (as well as clindamycin)  Plan: Pt meets the criteria for Extended interval Dosing - will dose 7mg /kg q 24h per antimicrobial dosing nomogram  Height: 5\' 10"  (177.8 cm) Weight: 212 lb (96.2 kg) IBW/kg (Calculated) : 73  Adjusted body weight: 82.3 kg  Temp (24hrs), Avg:97.5 F (36.4 C), Min:97.3 F (36.3 C), Max:97.6 F (36.4 C)  No results for input(s): WBC, CREATININE, LATICACIDVEN, VANCOTROUGH, VANCOPEAK, VANCORANDOM, GENTTROUGH, GENTPEAK, GENTRANDOM, TOBRATROUGH, TOBRAPEAK, TOBRARND, AMIKACINPEAK, AMIKACINTROU, AMIKACIN in the last 168 hours.  Estimated Creatinine Clearance: 145.7 mL/min (by C-G formula based on SCr of 0.69 mg/dL).    Allergies  Allergen Reactions  . Beef-Derived Products Anaphylaxis  . Keflex [Cephalexin] Anaphylaxis  . Mobic [Meloxicam] Anaphylaxis  . Pork-Derived Products Anaphylaxis  . Ciprofloxacin Hives    Antimicrobials this admission: Aztreonam 6/18 >> Metronidazole 6/18 >> Gentamycin 6/18 >>  Dose adjustments this admission: None  Microbiology results:   6/15 COVID NEG  Thank you for allowing pharmacy to be a part of this patient's care.  Lu Duffel, PharmD, BCPS Clinical Pharmacist 10/09/2018 6:25 PM

## 2018-10-09 NOTE — Plan of Care (Signed)

## 2018-10-09 NOTE — Anesthesia Preprocedure Evaluation (Signed)
Anesthesia Evaluation  Patient identified by MRN, date of birth, ID band Patient awake    Reviewed: Allergy & Precautions, H&P , NPO status , reviewed documented beta blocker date and time   Airway Mallampati: II  TM Distance: >3 FB Neck ROM: full    Dental  (+) Poor Dentition, Missing, Chipped, Dental Advidsory Given Very poor dentition:   Pulmonary Current Smoker,    Pulmonary exam normal        Cardiovascular Normal cardiovascular exam     Neuro/Psych PSYCHIATRIC DISORDERS Anxiety    GI/Hepatic   Endo/Other    Renal/GU      Musculoskeletal   Abdominal   Peds  Hematology   Anesthesia Other Findings Past Medical History: No date: Anxiety Smoker UDS pos for benzo & THC Past Surgical History: No date: BACK SURGERY BMI    Body Mass Index: 30.42 kg/m     Reproductive/Obstetrics                             Anesthesia Physical Anesthesia Plan  ASA: II  Anesthesia Plan: General   Post-op Pain Management:    Induction: Intravenous  PONV Risk Score and Plan: 2 and Ondansetron, Dexamethasone and Midazolam  Airway Management Planned: Oral ETT  Additional Equipment:   Intra-op Plan:   Post-operative Plan: Extubation in OR  Informed Consent: I have reviewed the patients History and Physical, chart, labs and discussed the procedure including the risks, benefits and alternatives for the proposed anesthesia with the patient or authorized representative who has indicated his/her understanding and acceptance.     Dental Advisory Given  Plan Discussed with: CRNA  Anesthesia Plan Comments:         Anesthesia Quick Evaluation

## 2018-10-10 ENCOUNTER — Telehealth: Payer: Self-pay | Admitting: Pharmacy Technician

## 2018-10-10 ENCOUNTER — Encounter: Payer: Self-pay | Admitting: Surgery

## 2018-10-10 LAB — CBC
HCT: 41.7 % (ref 39.0–52.0)
Hemoglobin: 13.5 g/dL (ref 13.0–17.0)
MCH: 30.9 pg (ref 26.0–34.0)
MCHC: 32.4 g/dL (ref 30.0–36.0)
MCV: 95.4 fL (ref 80.0–100.0)
Platelets: 307 10*3/uL (ref 150–400)
RBC: 4.37 MIL/uL (ref 4.22–5.81)
RDW: 13.2 % (ref 11.5–15.5)
WBC: 15.7 10*3/uL — ABNORMAL HIGH (ref 4.0–10.5)
nRBC: 0 % (ref 0.0–0.2)

## 2018-10-10 LAB — BASIC METABOLIC PANEL
Anion gap: 6 (ref 5–15)
BUN: 8 mg/dL (ref 6–20)
CO2: 26 mmol/L (ref 22–32)
Calcium: 8.6 mg/dL — ABNORMAL LOW (ref 8.9–10.3)
Chloride: 107 mmol/L (ref 98–111)
Creatinine, Ser: 0.77 mg/dL (ref 0.61–1.24)
GFR calc Af Amer: >60 mL/min (ref >60)
GFR calc non Af Amer: >60 mL/min (ref >60)
Glucose, Bld: 135 mg/dL — ABNORMAL HIGH (ref 70–99)
Potassium: 4.3 mmol/L (ref 3.5–5.1)
Sodium: 139 mmol/L (ref 135–145)

## 2018-10-10 LAB — PHOSPHORUS: Phosphorus: 3.5 mg/dL (ref 2.5–4.6)

## 2018-10-10 LAB — MAGNESIUM: Magnesium: 2 mg/dL (ref 1.7–2.4)

## 2018-10-10 MED ORDER — SODIUM CHLORIDE 0.9 % IV SOLN
1.0000 g | Freq: Three times a day (TID) | INTRAVENOUS | Status: DC
  Administered 2018-10-10 – 2018-10-13 (×9): 1 g via INTRAVENOUS
  Filled 2018-10-10 (×13): qty 1

## 2018-10-10 NOTE — Telephone Encounter (Signed)
Patient provided paystubs.  Approved to receive medication assistance at [facility] as long as eligibility criteria continues to be met.  [facility]

## 2018-10-10 NOTE — Consult Note (Signed)
Pharmacy Antibiotic Note  Vernon Allen is a 38 y.o. male admitted on 10/09/2018 with a perforated appendicitis  Pharmacy has been consulted for meropenem dosing. He was originally started on gentamicin, aztreonam and metronidazole due to multiple drug allergies. However, he has tolerated meropenem in the past. He is 1 day post-op laparoscopic appendectomy with leukocytosis this morning (most likely stress reaction from surgery)  to 15K but no fevers or infectious symptoms  Plan: Start meropenem 1 gram IV every 8 hours  Height: 5\' 10"  (177.8 cm) Weight: 212 lb (96.2 kg) IBW/kg (Calculated) : 73  Temp (24hrs), Avg:97.9 F (36.6 C), Min:97.2 F (36.2 C), Max:98.4 F (36.9 C)  Recent Labs  Lab 10/10/18 0535  WBC 15.7*  CREATININE 0.77    Estimated Creatinine Clearance: 145.7 mL/min (by C-G formula based on SCr of 0.77 mg/dL).    Allergies  Allergen Reactions  . Beef-Derived Products Anaphylaxis  . Keflex [Cephalexin] Anaphylaxis  . Mobic [Meloxicam] Anaphylaxis  . Pork-Derived Products Anaphylaxis  . Ciprofloxacin Hives    Antimicrobials this admission: Meropenem 6/19 >> Aztreonam 6/18 >> 6/19 Metronidazole 6/18 >> 6/19 Gentamycin 6/18 >> 6/19  Microbiology results: 6/15 SARS CoV-2: negative   Thank you for allowing pharmacy to be a part of this patient's care.  Dallie Piles, PharmD 10/10/2018 10:10 AM

## 2018-10-10 NOTE — Plan of Care (Signed)
  Problem: Activity: Goal: Risk for activity intolerance will decrease Outcome: Progressing  Patient ambulating independently in the halls

## 2018-10-10 NOTE — Progress Notes (Addendum)
[place] Hospital Day(s): 1.   Post op day(s): 1 Day Post-Op.   Interval History: Patient seen and examined, no acute events or new complaints overnight. Patient reports that he is feeling better than before surgery but does have incisional soreness. No fever, chills, nausea, or emesis. He has tolerated a clear liquid diet since surgery. JP in RLQ with 210 out. He did have a leukocytosis this morning to 15K but no fevers or infectious symptoms. Still with foley in place due to significant inflammation intra-op. Has not mobilized, but wants to go for a walk.    Vital signs in last 24 hours: [min-max] current  Temp:  [97.2 F (36.2 C)-98.4 F (36.9 C)] 97.8 F (36.6 C) (06/19 0627) Pulse Rate:  [73-105] 75 (06/19 0627) Resp:  [9-20] 20 (06/19 0627) BP: (128-142)/(82-98) 130/87 (06/19 0627) SpO2:  [91 %-100 %] 100 % (06/19 0627) Weight:  [96.2 kg] 96.2 kg (06/18 1004)     Height: 5\' 10"  (177.8 cm) Weight: 96.2 kg BMI (Calculated): 30.42   Intake/Output last 2 shifts:  06/18 0701 - 06/19 0700 In: 4654.5 [P.O.:180; I.V.:3874.2; IV Piggyback:600.4] Out: 2230 [Urine:1920; Drains:210; Blood:100]   Physical Exam:  Constitutional: alert, cooperative and no distress  Respiratory: breathing non-labored at rest  Cardiovascular: regular rate and sinus rhythm  Gastrointestinal: soft, incisional tenderness, and non-distended. No rebound, guarding. JP in RLQ with serosanguinous drainage. Integumentary: Midline laparotomy incision is intact with honeycomb, drainage on inferior portion, no erythema. Laparoscopic incisions with dressings in place.   Labs:  CBC Latest Ref Rng & Units 10/10/2018 10/02/2018 09/04/2018  WBC 4.0 - 10.5 K/uL 15.7(H) 8.3 15.1(H)  Hemoglobin 13.0 - 17.0 g/dL 13.5 14.3 12.5(L)  Hematocrit 39.0 - 52.0 % 41.7 42.4 37.8(L)  Platelets 150 - 400 K/uL 307 229 633(H)   CMP Latest Ref Rng & Units 10/10/2018 10/02/2018 09/01/2018  Glucose 70  - 99 mg/dL 135(H) 101(H) 96  BUN 6 - 20 mg/dL 8 16 10   Creatinine 0.61 - 1.24 mg/dL 0.77 0.69 0.72  Sodium 135 - 145 mmol/L 139 137 136  Potassium 3.5 - 5.1 mmol/L 4.3 4.1 3.6  Chloride 98 - 111 mmol/L 107 104 99  CO2 22 - 32 mmol/L 26 23 27   Calcium 8.9 - 10.3 mg/dL 8.6(L) 9.1 8.8(L)  Total Protein 6.5 - 8.1 g/dL - - -  Total Bilirubin 0.3 - 1.2 mg/dL - - -  Alkaline Phos 38 - 126 U/L - - -  AST 15 - 41 U/L - - -  ALT 0 - 44 U/L - - -    Imaging studies: No new pertinent imaging studies   Assessment/Plan:  38 y.o. male with leukocytosis, most likely reactive from surgery, who is otherwise overall doing well awaiting significant return of bowel function  1 Day Post-Op s/p laparoscopic appendectomy converted to open right hemicolectomy secondary to a severe inflammatory and contained perforation attributed to appendicitis   - Full liquids today + wean IVF  - Continue ABx; pharmacy helping given significant allergy history  - pain control prn; antiemetics prn  - monitor abdominal examination; on-going bowel function  - Monitor leukocytosis; morning labs  - Discontinue foley catheter  - Medical management of comorbidities   - mobilization encouraged; IS use   - DVT prophylaxis   All of the above findings and recommendations were discussed with the patient, and the medical team, and all of patient's questions were answered to his expressed satisfaction.  -- Edison Simon,  PA-C [facility] 10/10/2018, 8:01 AM [id number] M-F: 7am - 4pm  I saw and evaluated the patient.  I agree with the above documentation, exam, and plan, which I have edited where appropriate. Duanne GuessJennifer Kourtnie Sachs  11:18 AM

## 2018-10-11 LAB — CBC
HCT: 48.6 % (ref 39.0–52.0)
Hemoglobin: 15.4 g/dL (ref 13.0–17.0)
MCH: 31.1 pg (ref 26.0–34.0)
MCHC: 31.7 g/dL (ref 30.0–36.0)
MCV: 98.2 fL (ref 80.0–100.0)
Platelets: 354 10*3/uL (ref 150–400)
RBC: 4.95 MIL/uL (ref 4.22–5.81)
RDW: 13.6 % (ref 11.5–15.5)
WBC: 18.8 10*3/uL — ABNORMAL HIGH (ref 4.0–10.5)
nRBC: 0 % (ref 0.0–0.2)

## 2018-10-11 LAB — BASIC METABOLIC PANEL
Anion gap: 8 (ref 5–15)
BUN: 10 mg/dL (ref 6–20)
CO2: 25 mmol/L (ref 22–32)
Calcium: 8.8 mg/dL — ABNORMAL LOW (ref 8.9–10.3)
Chloride: 109 mmol/L (ref 98–111)
Creatinine, Ser: 1.04 mg/dL (ref 0.61–1.24)
GFR calc Af Amer: >60 mL/min (ref >60)
GFR calc non Af Amer: >60 mL/min (ref >60)
Glucose, Bld: 131 mg/dL — ABNORMAL HIGH (ref 70–99)
Potassium: 4.5 mmol/L (ref 3.5–5.1)
Sodium: 142 mmol/L (ref 135–145)

## 2018-10-11 LAB — GLUCOSE, CAPILLARY: Glucose-Capillary: 134 mg/dL — ABNORMAL HIGH (ref 70–99)

## 2018-10-11 NOTE — Progress Notes (Signed)
[place] Hospital Day(s): 2.   Post op day(s): 2 Days Post-Op.   Interval History: Patient seen and examined, no acute events or new complaints overnight. Patient reports feeling okay.  He reports that the pain is controlled.  He reports that he had a small accident of stool this morning and sat down in the commode but no gas was passed.  He denies abdominal distention.  He denies nausea or vomiting at the moment of my evaluation but the nurse reported that he have some nausea in the morning.  Vital signs in last 24 hours: [min-max] current  Temp:  [97.7 F (36.5 C)-98.5 F (36.9 C)] 97.7 F (36.5 C) (06/20 1403) Pulse Rate:  [92-120] 120 (06/20 1403) Resp:  [16-20] 16 (06/20 1403) BP: (129-152)/(100-106) 129/105 (06/20 1403) SpO2:  [98 %-100 %] 98 % (06/20 1403)     Height: 5\' 10"  (177.8 cm) Weight: 96.2 kg BMI (Calculated): 30.42   Physical Exam:  Constitutional: alert, cooperative and no distress  Respiratory: breathing non-labored at rest  Cardiovascular: regular rate and sinus rhythm  Gastrointestinal: soft, non-tender, and non-distended. Wound is dry and clean.   Labs:  CBC Latest Ref Rng & Units 10/11/2018 10/10/2018 10/02/2018  WBC 4.0 - 10.5 K/uL 18.8(H) 15.7(H) 8.3  Hemoglobin 13.0 - 17.0 g/dL 15.4 13.5 14.3  Hematocrit 39.0 - 52.0 % 48.6 41.7 42.4  Platelets 150 - 400 K/uL 354 307 229   CMP Latest Ref Rng & Units 10/11/2018 10/10/2018 10/02/2018  Glucose 70 - 99 mg/dL 131(H) 135(H) 101(H)  BUN 6 - 20 mg/dL 10 8 16   Creatinine 0.61 - 1.24 mg/dL 1.04 0.77 0.69  Sodium 135 - 145 mmol/L 142 139 137  Potassium 3.5 - 5.1 mmol/L 4.5 4.3 4.1  Chloride 98 - 111 mmol/L 109 107 104  CO2 22 - 32 mmol/L 25 26 23   Calcium 8.9 - 10.3 mg/dL 8.8(L) 8.6(L) 9.1  Total Protein 6.5 - 8.1 g/dL - - -  Total Bilirubin 0.3 - 1.2 mg/dL - - -  Alkaline Phos 38 - 126 U/L - - -  AST 15 - 41 U/L - - -  ALT 0 - 44 U/L - - -    Imaging studies: No new pertinent imaging  studies   Assessment/Plan:  38 y.o. male with chronic appendicitis status post right colectomy postop day #2.  He has been recovering slowly.  Today with increased leukocytosis.  There is no fever.  There is tachycardia up to 120.  On my physical exam today has a benign abdomen without significant distention and without significant pain.  Expected postop tenderness on the right lower quadrant.  We will continue following drain.  We will continue with antibiotics.  Patient on full liquids.  Since he has not passed gas will not advance diet today.  There is no significant electrolyte disturbance.  We will continue with pain management and DVT prophylaxis.   Arnold Long, MD

## 2018-10-11 NOTE — Consult Note (Signed)
Pharmacy Antibiotic Note  Vernon Allen is a 38 y.o. male admitted on 10/09/2018 with a perforated appendicitis  Pharmacy has been consulted for meropenem dosing. He was originally started on gentamicin, aztreonam and metronidazole due to multiple drug allergies. However, he has tolerated meropenem in the past. He is 1 day post-op laparoscopic appendectomy with leukocytosis this morning (most likely stress reaction from surgery)  to 15K but no fevers or infectious symptoms  Plan: Continue meropenem 1 gram IV every 8 hours  Height: 5\' 10"  (177.8 cm) Weight: 212 lb (96.2 kg) IBW/kg (Calculated) : 73  Temp (24hrs), Avg:98.2 F (36.8 C), Min:97.8 F (36.6 C), Max:98.5 F (36.9 C)  Recent Labs  Lab 10/10/18 0535 10/11/18 0722  WBC 15.7* 18.8*  CREATININE 0.77 1.04    Estimated Creatinine Clearance: 112.1 mL/min (by C-G formula based on SCr of 1.04 mg/dL).    Allergies  Allergen Reactions  . Beef-Derived Products Anaphylaxis  . Keflex [Cephalexin] Anaphylaxis  . Mobic [Meloxicam] Anaphylaxis  . Pork-Derived Products Anaphylaxis  . Ciprofloxacin Hives    Antimicrobials this admission: Meropenem 6/19 >> Aztreonam 6/18 >> 6/19 Metronidazole 6/18 >> 6/19 Gentamycin 6/18 >> 6/19  Microbiology results: 6/15 SARS CoV-2: negative   Thank you for allowing pharmacy to be a part of this patient's care.  Simpson,Leander L, RPh 10/11/2018 11:09 AM

## 2018-10-11 NOTE — Progress Notes (Signed)
MD notified: BP still around the same level as earlier today. BP 144/100, HR 97. Rechecked BP 130/104, HR 85.

## 2018-10-11 NOTE — Plan of Care (Addendum)
The patient has been stable. No vomiting episodes. Walked in the hallway 8 laps. Had A loose stool today. IV fluids and antibiotics provided. Pain managed with PRN pain meds.  Problem: Education: Goal: Knowledge of General Education information will improve Description: Including pain rating scale, medication(s)/side effects and non-pharmacologic comfort measures Outcome: Progressing   Problem: Health Behavior/Discharge Planning: Goal: Ability to manage health-related needs will improve Outcome: Progressing   Problem: Clinical Measurements: Goal: Ability to maintain clinical measurements within normal limits will improve Outcome: Progressing Goal: Will remain free from infection Outcome: Progressing Goal: Diagnostic test results will improve Outcome: Progressing Goal: Respiratory complications will improve Outcome: Progressing Goal: Cardiovascular complication will be avoided Outcome: Progressing   Problem: Activity: Goal: Risk for activity intolerance will decrease Outcome: Progressing   Problem: Nutrition: Goal: Adequate nutrition will be maintained Outcome: Progressing   Problem: Coping: Goal: Level of anxiety will decrease Outcome: Progressing   Problem: Elimination: Goal: Will not experience complications related to bowel motility Outcome: Progressing Goal: Will not experience complications related to urinary retention Outcome: Progressing   Problem: Pain Managment: Goal: General experience of comfort will improve Outcome: Progressing   Problem: Safety: Goal: Ability to remain free from injury will improve Outcome: Progressing   Problem: Skin Integrity: Goal: Risk for impaired skin integrity will decrease Outcome: Progressing

## 2018-10-12 NOTE — Progress Notes (Signed)
[place] Hospital Day(s): 3.   Post op day(s): 3 Days Post-Op.   Interval History: Patient seen and examined, no acute events or new complaints overnight. Patient reports feeling better today.  He reports that he had more bowel movement yesterday that was mixed with rectal gas.  Denies nausea or vomiting today.  Reports he has been tolerating full liquids.  He feels that he wants to try some mashed potatoes or some mac & cheese.  He has been walking around the nurses station without problem.  Vital signs in last 24 hours: [min-max] current  Temp:  [97.7 F (36.5 C)-98.2 F (36.8 C)] 98 F (36.7 C) (06/21 0626) Pulse Rate:  [83-120] 85 (06/21 0626) Resp:  [14-20] 20 (06/21 0626) BP: (127-144)/(98-105) 127/98 (06/21 0626) SpO2:  [97 %-100 %] 97 % (06/21 0626)     Height: _0  (177.8 cm) Weight: 96.2 kg BMI (Calculated): 30.42   Physical Exam:  Constitutional: alert, cooperative and no distress  Respiratory: breathing non-labored at rest  Cardiovascular: regular rate and sinus rhythm  Gastrointestinal: soft, non-tender, and non-distended.  Labs:  CBC Latest Ref Rng & Units 10/11/2018 10/10/2018 10/02/2018  WBC 4.0 - 10.5 K/uL 18.8(H) 15.7(H) 8.3  Hemoglobin 13.0 - 17.0 g/dL 15.4 13.5 14.3  Hematocrit 39.0 - 52.0 % 48.6 41.7 42.4  Platelets 150 - 400 K/uL 354 307 229   CMP Latest Ref Rng & Units 10/11/2018 10/10/2018 10/02/2018  Glucose 70 - 99 mg/dL 131(H) 135(H) 101(H)  BUN 6 - 20 mg/dL _1 Creatinine 0.61 - 1.24 mg/dL 1.04 0.77 0.69  Sodium 135 - 145 mmol/L 142 139 137  Potassium 3.5 - 5.1 mmol/L 4.5 4.3 4.1  Chloride 98 - 111 mmol/L 109 107 104  CO2 22 - 32 mmol/L _2 Calcium 8.9 - 10.3 mg/dL 8.8(L) 8.6(L) 9.1  Total Protein 6.5 - 8.1 g/dL - - -  Total Bilirubin 0.3 - 1.2 mg/dL - - -  Alkaline Phos 38 - 126 U/L - - -  AST 15 - 41 U/L - - -  ALT 0 - 44 U/L - - -    Imaging studies: No new pertinent imaging studies   Assessment/Plan:  38  y.o.malewith chronic appendicitis status post right colectomy postop day #3.  Recovering better.  Today without tachycardia.  There is no fever.  There is no nausea either.  He passed gas yesterday.  Will advance diet to soft diet.  Will order CBC for tomorrow to follow white blood cell count trend.  We will continue with IV antibiotic therapy.  Patient encouraged to ambulate.  And will assess diet toleration.   Currently on DVT prophylaxis.  Arnold Long, MD

## 2018-10-12 NOTE — Plan of Care (Addendum)
Drain output was 290 during the day shift. No falls. The patient ambulated 4 different times today. The patient reported having a bowel movement and passing gas. Pain managed with oral pain meds. Dressing changed around JP drain do to leaking. The patient still has some trouble tolerating soft diet.  Problem: Education: Goal: Knowledge of General Education information will improve Description: Including pain rating scale, medication(s)/side effects and non-pharmacologic comfort measures Outcome: Progressing   Problem: Health Behavior/Discharge Planning: Goal: Ability to manage health-related needs will improve Outcome: Progressing   Problem: Clinical Measurements: Goal: Ability to maintain clinical measurements within normal limits will improve Outcome: Progressing Goal: Will remain free from infection Outcome: Progressing Goal: Diagnostic test results will improve Outcome: Progressing Goal: Respiratory complications will improve Outcome: Progressing Goal: Cardiovascular complication will be avoided Outcome: Progressing   Problem: Activity: Goal: Risk for activity intolerance will decrease Outcome: Progressing   Problem: Nutrition: Goal: Adequate nutrition will be maintained Outcome: Progressing   Problem: Coping: Goal: Level of anxiety will decrease Outcome: Progressing   Problem: Elimination: Goal: Will not experience complications related to bowel motility Outcome: Progressing Goal: Will not experience complications related to urinary retention Outcome: Progressing   Problem: Pain Managment: Goal: General experience of comfort will improve Outcome: Progressing   Problem: Safety: Goal: Ability to remain free from injury will improve Outcome: Progressing   Problem: Skin Integrity: Goal: Risk for impaired skin integrity will decrease Outcome: Progressing

## 2018-10-13 LAB — CBC
HCT: 36.5 % — ABNORMAL LOW (ref 39.0–52.0)
Hemoglobin: 11.8 g/dL — ABNORMAL LOW (ref 13.0–17.0)
MCH: 30.6 pg (ref 26.0–34.0)
MCHC: 32.3 g/dL (ref 30.0–36.0)
MCV: 94.8 fL (ref 80.0–100.0)
Platelets: 290 10*3/uL (ref 150–400)
RBC: 3.85 MIL/uL — ABNORMAL LOW (ref 4.22–5.81)
RDW: 12.9 % (ref 11.5–15.5)
WBC: 13.3 10*3/uL — ABNORMAL HIGH (ref 4.0–10.5)
nRBC: 0 % (ref 0.0–0.2)

## 2018-10-13 LAB — SURGICAL PATHOLOGY

## 2018-10-13 MED ORDER — METRONIDAZOLE 500 MG PO TABS: 500.0000 mg | ORAL_TABLET | Freq: Three times a day (TID) | ORAL | 0 refills | Status: AC

## 2018-10-13 MED ORDER — OXYCODONE HCL 5 MG PO TABS: 5.0000 mg | ORAL_TABLET | Freq: Four times a day (QID) | ORAL | 0 refills | Status: DC | PRN

## 2018-10-13 MED ORDER — SULFAMETHOXAZOLE-TRIMETHOPRIM 800-160 MG PO TABS: 1.0000 | ORAL_TABLET | Freq: Two times a day (BID) | ORAL | 0 refills | Status: AC

## 2018-10-13 NOTE — Progress Notes (Deleted)
[facility] SURGICAL PROGRESS NOTE  Hospital Day(s): 4.   Post op day(s): 4 Days Post-Op.   Interval History: Patient seen and examined, no acute events or new complaints overnight. Patient reports that he still has incisional abdominal soreness, but this is gradually improving daily. No fever, chills, nausea, or emesis. He continues to have flatus and small reported BMs. He was advanced to soft diet yesterday and he tolerated this but it made his stomach "feel heavy," so he took it easy. He is mobilizing well. JP with 570 ccs out in last 24 hours although remains serosanguinous. Leukocytosis improved to 13K this morning.     Vital signs in last 24 hours: [min-max] current  Temp:  [98 F (36.7 C)-98.9 F (37.2 C)] 98.9 F (37.2 C) (06/22 0505) Pulse Rate:  [81-101] 88 (06/22 0505) Resp:  [16-18] 18 (06/22 0505) BP: (101-143)/(51-97) 119/82 (06/22 0505) SpO2:  [98 %-100 %] 100 % (06/22 0505)     Height: 5\' 10"  (177.8 cm) Weight: 96.2 kg BMI (Calculated): 30.42   Intake/Output last 2 shifts:  06/21 0701 - 06/22 0700 In: 1786.5 [P.O.:360; I.V.:1126.5; IV Piggyback:300] Out: 570 [Drains:570]   Physical Exam:  Constitutional: alert, cooperative and no distress  Respiratory: breathing non-labored at rest  Gastrointestinal: soft, mild incisionalsoreness, and non-distended. JP in RLQ with serosanguinous output Integumentary: Midline laparotomy wound is CDI with staples, minimal drainage from penrose, no erythema (see picture). Laparoscopic incisions are CDI with staples, no erythema or drainage from these.   Laparotomy Incision - 10/12/2018:     Labs:  CBC Latest Ref Rng & Units 10/13/2018 10/11/2018 10/10/2018  WBC 4.0 - 10.5 K/uL 13.3(H) 18.8(H) 15.7(H)  Hemoglobin 13.0 - 17.0 g/dL 11.8(L) 15.4 13.5  Hematocrit 39.0 - 52.0 % 36.5(L) 48.6 41.7  Platelets 150 - 400 K/uL 290 354 307   CMP Latest Ref Rng & Units 10/11/2018 10/10/2018 10/02/2018  Glucose 70 - 99 mg/dL  914(N131(H) 829(F135(H) 621(H101(H)  BUN 6 - 20 mg/dL 10 8 16   Creatinine 0.61 - 1.24 mg/dL 0.861.04 5.780.77 4.690.69  Sodium 135 - 145 mmol/L 142 139 137  Potassium 3.5 - 5.1 mmol/L 4.5 4.3 4.1  Chloride 98 - 111 mmol/L 109 107 104  CO2 22 - 32 mmol/L 25 26 23   Calcium 8.9 - 10.3 mg/dL 6.2(X8.8(L) 5.2(W8.6(L) 9.1  Total Protein 6.5 - 8.1 g/dL - - -  Total Bilirubin 0.3 - 1.2 mg/dL - - -  Alkaline Phos 38 - 126 U/L - - -  AST 15 - 41 U/L - - -  ALT 0 - 44 U/L - - -    Imaging studies: No new pertinent imaging studies   Assessment/Plan:  38 y.o. male with return of bowel function, improving leukocytosis, and overall doing well 4 Days Post-Op s/p laparoscopic appendectomy converted to open right hemicolectomy secondary to a severe inflammatory and contained perforation attributed to appendicitis   - Continue soft diet as tolerates  - Discontinue IVF  - Continue IV ABx (Meropenem); will need to convert to PO for discharge  - pain control prn; antiemetics prn             - monitor abdominal examination; on-going bowel function   - Medical management of comorbidities              - mobilization encouraged; IS use              - DVT prophylaxis     - Discharge planning: hopefully home in next  24 hours; will need to go home with drain + Abx; follow up with Pabon in ~1 week   All of the above findings and recommendations were discussed with the patient, and the medical team, and all of patient's questions were answered to his expressed satisfaction.  -- Edison Simon, PA-C [facility] 10/13/2018, 8:42 AM [phone] M-F: 7am - 4pm

## 2018-10-13 NOTE — Discharge Instructions (Signed)
In addition to included general post-operative instructions for open appendectomy,  Diet: Resume home diet as tolerates.   Activity: No heavy lifting >20 pounds (children, pets, laundry, garbage) or strenuous activity until cleared by surgeon in follow up  Wound care: You may shower/get incision wet with soapy water and pat dry (do not rub incisions), but no baths or submerging incision underwater until follow-up. Continue to empty and record drain output at home daily (or multiple times a day)  Medications: Resume all home medications. For mild to moderate pain: acetaminophen (Tylenol) or ibuprofen/naproxen (if no kidney disease). Combining Tylenol with alcohol can substantially increase your risk of causing liver disease. Narcotic pain medications, if prescribed, can be used for severe pain, though may cause nausea, constipation, and drowsiness. Do not combine Tylenol and Percocet (or similar) within a 6 hour period as Percocet (and similar) contain(s) Tylenol. If you do not need the narcotic pain medication, you do not need to fill the prescription.  Call office [phone] / [phone]) at any time if any questions, worsening pain, fevers/chills, bleeding, drainage from incision site, or other concerns.

## 2018-10-13 NOTE — Progress Notes (Signed)
Pt seen and examined. Taking PO, afebrile, abd benign. Ready for DC

## 2018-10-13 NOTE — Discharge Summary (Signed)
[facility] SURGICAL DISCHARGE SUMMARY  Patient ID: Vernon Allen MRN: [id number] DOB/AGE: 11-10-1980 38 y.o.  Admit date: 10/09/2018 Discharge date: 10/13/2018  Discharge Diagnoses Patient Active Problem List   Diagnosis Date Noted  . Appendicitis 10/09/2018  . Acute appendicitis   . Perforated appendicitis 08/28/2018    Consultants None  Procedures 10/09/2018:  Laparoscopic Appendectomy Converted to Open Right Hemicolectomy  HPI: Vernon Allen is a 38 y.o. male with a history of appendicitis with contained perforation who has been following with Dr Vernon Allen outpatient. His drain was removed outpatient but he had persistent RLQ pain. Repeat imaging showed persistent inflammatory response in the RLQ and given failed attempts at conservative management the decision was made to proceed with appendectomy. He presented to [facility] on 06+/18 for scheduled procedure with Dr Vernon Allen.   Hospital Course: Informed consent was obtained and documented, and patient underwent attempted laparoscopic appendectomy which was converted to open right hemicolectomy given significant inflammatory changes to cecum and appendix (Dr Vernon Allen, 10/09/2018).  Post-operatively, patient's pain and nausea improved/resolved and advancement of patient's diet over a few days and ambulation were well-tolerated. The patient had a persistent leukocytosis over the first few post-op days which gradually improved. On day of discharge his WBC improved to 13K (had previous been 15-18K). The remainder of patient's hospital course was essentially unremarkable, and discharge planning was initiated accordingly with patient safely able to be discharged home with appropriate discharge instructions, drain teaching, antibiotics (Bactrim + Flagyl x1 week), pain control, and outpatient follow-up after all of his questions were answered to his expressed satisfaction.   Discharge Condition: Good   Physical Examination:   Constitutional: Well appearing male, NAD HEENT: No scleral icterus Pulmonary: Normal effort, no respiratory distress Gastrointestinal: Soft, incisional soreness, non-distended. No rebound/guarding.  Skin: Midline laparotomy wound is CDI with staples, minimal drainage from penrose, no erythema (see picture). Laparoscopic incisions are CDI with staples, no erythema or drainage from these.   Laparotomy Incision (penrose removed prior to discharge) - 10/12/2018:       Allergies as of 10/13/2018      Reactions   Beef-derived Products Anaphylaxis   Keflex [cephalexin] Anaphylaxis   Mobic [meloxicam] Anaphylaxis   Pork-derived Products Anaphylaxis   Ciprofloxacin Hives      Medication List    STOP taking these medications   Erythromycin 500 MG Tbec   neomycin 500 MG tablet Commonly known as: MYCIFRADIN     TAKE these medications   bisacodyl 5 MG EC tablet Generic drug: bisacodyl Take 4 tablets (5 mg each) at 8 am the day before surgery.   CLEAR EYES OP Place 1 drop into both eyes daily as needed (allergies).   metroNIDAZOLE 500 MG tablet Commonly known as: Flagyl Take 1 tablet (500 mg total) by mouth 3 (three) times daily for 7 days.   NYQUIL PO Take 1 Dose by mouth at bedtime as needed (sleep).   oxyCODONE 5 MG immediate release tablet Commonly known as: Oxy IR/ROXICODONE Take 1 tablet (5 mg total) by mouth every 6 (six) hours as needed for severe pain or breakthrough pain.   sulfamethoxazole-trimethoprim 800-160 MG tablet Commonly known as: BACTRIM DS Take 1 tablet by mouth 2 (two) times daily for 7 days.        Follow-up Information    Allen, HawaiiDiego F, MD. Schedule an appointment as soon as possible for a visit in 1 week(s).   Specialty: General Surgery Why: 1 week follow up, s/p open right hemicolectomy secondary  to appendicitis, has JP drain and staples, will need face to face Contact information: [address] [place] [address]  [phone]            Time spent on discharge management including discussion of hospital course, clinical condition, outpatient instructions, prescriptions, and follow up with the patient and members of the medical team: >30 minutes  -- Edison Simon , PA-C [facility]  10/13/2018, 9:41 AM [phone] M-F: 7am - 4pm

## 2018-10-22 ENCOUNTER — Other Ambulatory Visit: Payer: Self-pay

## 2018-10-22 ENCOUNTER — Encounter: Payer: Self-pay | Admitting: Surgery

## 2018-10-22 ENCOUNTER — Ambulatory Visit (INDEPENDENT_AMBULATORY_CARE_PROVIDER_SITE_OTHER): Payer: Self-pay | Admitting: Surgery

## 2018-10-22 VITALS — BP 130/80 | HR 70 | Temp 97.7°F | Ht 68.0 in | Wt 200.0 lb

## 2018-10-22 DIAGNOSIS — Z09 Encounter for follow-up examination after completed treatment for conditions other than malignant neoplasm: Secondary | ICD-10-CM

## 2018-10-22 NOTE — Patient Instructions (Signed)
Return as needed.The patient is aware to call back for any questions or concerns.  

## 2018-10-23 NOTE — Progress Notes (Signed)
S/p right colectomy for appendicitis and Phlegmon Doing very well No fevers or chills Pain controlled + BMs, + PO Path d/w pt in detail  PE NAD Abd: soft, staples removed. Incision c./d/i, no infection  A/P Doing very well No heavy lifting rtc prn

## 2018-10-27 ENCOUNTER — Encounter: Payer: Self-pay | Admitting: Surgery

## 2018-11-12 ENCOUNTER — Telehealth: Payer: Self-pay | Admitting: Surgery

## 2018-11-12 NOTE — Telephone Encounter (Signed)
Patient is having some burning on the inside of his stomach, said he also feels woozy feeling. Please call patient and advise.

## 2018-11-12 NOTE — Telephone Encounter (Signed)
Patient states he feels weird, stomach spasms near the incision site.  Good bowel movements, drinking plenty of fluids. No fever, chills, nausea or vomiting. No pain only some discomfort. When lying down he feels nothing.   Spoke with Thedore Mins and patient was added  to schedule 11/13/2018 @ 9am.

## 2018-11-13 ENCOUNTER — Ambulatory Visit (INDEPENDENT_AMBULATORY_CARE_PROVIDER_SITE_OTHER): Payer: Self-pay | Admitting: Physician Assistant

## 2018-11-13 ENCOUNTER — Encounter: Payer: Self-pay | Admitting: Physician Assistant

## 2018-11-13 ENCOUNTER — Other Ambulatory Visit: Payer: Self-pay

## 2018-11-13 VITALS — BP 145/93 | HR 76 | Temp 97.9°F | Ht 68.0 in | Wt 208.0 lb

## 2018-11-13 DIAGNOSIS — K3532 Acute appendicitis with perforation and localized peritonitis, without abscess: Secondary | ICD-10-CM

## 2018-11-13 DIAGNOSIS — Z09 Encounter for follow-up examination after completed treatment for conditions other than malignant neoplasm: Secondary | ICD-10-CM

## 2018-11-13 MED ORDER — CYCLOBENZAPRINE HCL 5 MG PO TABS: 5.0000 mg | ORAL_TABLET | Freq: Three times a day (TID) | ORAL | 0 refills | Status: DC | PRN

## 2018-11-13 NOTE — Patient Instructions (Signed)
   Follow-up with our office as needed.  Please call and ask to speak with a nurse if you develop questions or concerns.  

## 2018-11-13 NOTE — Progress Notes (Signed)
[facility] POST-OP OFFICE VISIT  11/13/2018  HPI: Vernon Allen is a 38 y.o. male ~5 weeks s/p open right hemicolectomy for perforated appendicitis s/p drain placement with Dr Dahlia Byes on 06/18. He was seen in post-op follow up on 07/01 ad doing well and cleared by Dr Dahlia Byes.   He presents today with complaints of muscle soreness and intermittent spasm at his incision site. 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 Pain is intermittent in nature and resolves with resting. Intermittent spasms. Only taking tylenol/motrin for pain. No fever, nausea, emesis. Tolerating a diet. No other complaints or acute problems.    Vital signs: Ht 5\' 8"  (1.727 m)   BMI 30.41 kg/m    Physical Exam: Constitutional: Well appearing male, NAD Abdomen: soft, non-tender, non-distended, no rebound/guarding.  Skin: Laparotomy incision is well healed, induration, no erythema or drainage.   Assessment/Plan: This is a 38 y.o. male ~5 weeks s/p open right hemicolectomy for perforated appendicitis now with intermittent muscle spasm most likely attributed to over exertion at work.    - Will give 5mg  Flexeril PRN (10 pills) to help with muscle spasms, precautions given  - Recommend abdominal/core support at work  - Continue lifting restrictions until full 6 weeks post-op (August 3rd)  - Letter for work provided  - RTC PRN  -- Edison Simon, PA-C [facility] 11/13/2018, 9:08 AM [phone] M-F: 7am - 4pm '

## 2018-11-18 ENCOUNTER — Telehealth: Payer: Self-pay | Admitting: Surgery

## 2018-11-18 NOTE — Telephone Encounter (Signed)
Called patient for reminder calls for tomorrow patient cancelled due to the patient was feeling better now, patient just had some questions regarding restrictions at work. Please call patient and advise.

## 2018-11-18 NOTE — Telephone Encounter (Signed)
Patient wanted to be sure he was alright to return to work without restrictions on 11/24/18. I let him know that was what Dr Dahlia Byes had recommended. He is to call if he has any problems.

## 2018-11-19 ENCOUNTER — Ambulatory Visit: Payer: Self-pay | Admitting: Surgery

## 2019-11-10 ENCOUNTER — Emergency Department
Admission: EM | Admit: 2019-11-10 | Discharge: 2019-11-10 | Disposition: A | Discharge status: Home / Self Care | Payer: Self-pay | Attending: Emergency Medicine | Admitting: Emergency Medicine

## 2019-11-10 ENCOUNTER — Other Ambulatory Visit: Payer: Self-pay

## 2019-11-10 ENCOUNTER — Emergency Department: Payer: Self-pay

## 2019-11-10 DIAGNOSIS — F1210 Cannabis abuse, uncomplicated: Secondary | ICD-10-CM | POA: Insufficient documentation

## 2019-11-10 DIAGNOSIS — Y9389 Activity, other specified: Secondary | ICD-10-CM | POA: Insufficient documentation

## 2019-11-10 DIAGNOSIS — S39012A Strain of muscle, fascia and tendon of lower back, initial encounter: Secondary | ICD-10-CM | POA: Insufficient documentation

## 2019-11-10 DIAGNOSIS — W19XXXA Unspecified fall, initial encounter: Secondary | ICD-10-CM

## 2019-11-10 DIAGNOSIS — W010XXA Fall on same level from slipping, tripping and stumbling without subsequent striking against object, initial encounter: Secondary | ICD-10-CM | POA: Insufficient documentation

## 2019-11-10 DIAGNOSIS — Y990 Civilian activity done for income or pay: Secondary | ICD-10-CM | POA: Insufficient documentation

## 2019-11-10 DIAGNOSIS — S7011XA Contusion of right thigh, initial encounter: Secondary | ICD-10-CM | POA: Insufficient documentation

## 2019-11-10 DIAGNOSIS — Y9289 Other specified places as the place of occurrence of the external cause: Secondary | ICD-10-CM | POA: Insufficient documentation

## 2019-11-10 DIAGNOSIS — F17210 Nicotine dependence, cigarettes, uncomplicated: Secondary | ICD-10-CM | POA: Insufficient documentation

## 2019-11-10 MED ORDER — HYDROCODONE-ACETAMINOPHEN 5-325 MG PO TABS: 1.0000 | ORAL_TABLET | Freq: Four times a day (QID) | ORAL | 0 refills | Status: DC | PRN

## 2019-11-10 MED ORDER — CYCLOBENZAPRINE HCL 10 MG PO TABS: 10.0000 mg | ORAL_TABLET | Freq: Every day | ORAL | 0 refills | Status: DC

## 2019-11-10 NOTE — Discharge Instructions (Signed)
Follow-up with your primary care provider or urgent care if any continued problems.  You may still apply ice to your thigh as needed for discomfort.  Flexeril is to be taken at bedtime as it can cause drowsiness.  The hydrocodone also can only be taken when you are not at work.  You may use ice or heat to your back as needed for discomfort.  Do not take these 2 medications while at work as it can increase your chances for injury.

## 2019-11-10 NOTE — ED Notes (Signed)
Patient transported to X-ray 

## 2019-11-10 NOTE — ED Triage Notes (Signed)
Pt states he was changing tires on a tractor trailer and a bar came back and hit him in the right upper leg causing him to fall on friday. Pt c/o right hip and back pain.

## 2019-11-10 NOTE — ED Provider Notes (Signed)
[facility] Emergency Department Provider Note   ____________________________________________   First MD Initiated Contact with Patient 11/10/19 [phone]     (approximate)  I have reviewed the triage vital signs and the nursing notes.   HISTORY  Chief Complaint Back Pain    HPI Vernon Allen is a 39 y.o. male presents to the ED with complaint of low back pain along with right thigh pain after falling while at work 4 days ago.  Patient states that he was changing a tire on a tractor trailer and the bar came down his leg and also caused him to fall backwards onto his back.  He denies any head injury or loss of consciousness.  Patient states that he does not want to file this as Workmen's Comp.  He has continued to be ambulatory since this happened.  Currently rates his pain as 7 out of 10.       Past Medical History:  Diagnosis Date  . Anxiety     Patient Active Problem List   Diagnosis Date Noted  . Appendicitis 10/09/2018  . Acute appendicitis   . Perforated appendicitis 08/28/2018    Past Surgical History:  Procedure Laterality Date  . BACK SURGERY    . LAPAROSCOPIC APPENDECTOMY N/A 10/09/2018   Procedure: APPENDECTOMY LAPAROSCOPIC ATTEMPTED;  Surgeon: Leafy Ro, MD;  Location: [facility] ORS;  Service: General;  Laterality: N/A;  . PARTIAL COLECTOMY Right 10/09/2018   Procedure: PARTIAL COLECTOMY;  Surgeon: Leafy Ro, MD;  Location: [facility] ORS;  Service: General;  Laterality: Right;    Prior to Admission medications   Medication Sig Start Date End Date Taking? Authorizing Provider  bisacodyl (BISACODYL) 5 MG EC tablet Take 4 tablets (5 mg each) at 8 am the day before surgery. 10/06/18   Pabon, Diego F, MD  cyclobenzaprine (FLEXERIL) 10 MG tablet Take 1 tablet (10 mg total) by mouth at bedtime. 11/10/19   Tommi Rumps, PA-C  HYDROcodone-acetaminophen (NORCO/VICODIN) 5-325 MG tablet Take 1 tablet by mouth every 6 (six) hours as needed for  moderate pain. 11/10/19   Tommi Rumps, PA-C  Naphazoline HCl (CLEAR EYES OP) Place 1 drop into both eyes daily as needed (allergies).    [provider]  Pseudoeph-Doxylamine-DM-APAP (NYQUIL PO) Take 1 Dose by mouth at bedtime as needed (sleep).    [provider]    Allergies Beef-derived products, Keflex [cephalexin], Mobic [meloxicam], Pork-derived products, and Ciprofloxacin  No family history on file.  Social History Social History   Tobacco Use  . Smoking status: Current Some Day Smoker    Packs/day: 0.25    Types: Cigarettes  . Smokeless tobacco: Never Used  Vaping Use  . Vaping Use: Never used  Substance Use Topics  . Alcohol use: Yes    Comment: (2) 12 packs/week  . Drug use: Yes    Types: Marijuana    Review of Systems Constitutional: No fever/chills Eyes: No visual changes. ENT: No trauma. Cardiovascular: Denies chest pain. Respiratory: Denies shortness of breath. Gastrointestinal: No abdominal pain.  No nausea, no vomiting.  Musculoskeletal: Positive low back pain and right upper thigh pain. Skin: Positive for bruising. Neurological: Negative for headaches, focal weakness or numbness.  ____________________________________________   PHYSICAL EXAM:  VITAL SIGNS: ED Triage Vitals [11/10/19 0833]  Enc Vitals Group     BP 118/75     Pulse Rate 73     Resp 16     Temp 97.7 F (36.5 C)  Temp Source Oral     SpO2 100 %     Weight 205 lb (93 kg)     Height 5\' 8"  (1.727 m)     Head Circumference      Peak Flow      Pain Score 7     Pain Loc      Pain Edu?      Excl. in GC?     Constitutional: Alert and oriented. Well appearing and in no acute distress. Eyes: Conjunctivae are normal. PERRL. EOMI. Head: Atraumatic. Nose: No trauma. Neck: No stridor.   Cardiovascular: Normal rate, regular rhythm. Grossly normal heart sounds.  Good peripheral circulation. Respiratory: Normal respiratory effort.  No retractions. Lungs  CTAB. Gastrointestinal: Soft and nontender. No distention.  Musculoskeletal: On examination of the back there is no gross deformity and no point tenderness on palpation of the thoracic spine however along the L5-S1 and sacral area there is moderate tenderness.  No soft tissue edema or abrasions are seen.  No discoloration.  Also the anterior portion of the right thigh is tender with resolving ecchymosis.  Skin is intact.  Patient range of motion and weightbearing without increased pain.  No injury noted to the patella.. Neurologic:  Normal speech and language. No gross focal neurologic deficits are appreciated. No gait instability. Skin:  Skin is warm, dry and intact.  Ecchymosis is noted above. Psychiatric: Mood and affect are normal. Speech and behavior are normal.  ____________________________________________   LABS (all labs ordered are listed, but only abnormal results are displayed)  Labs Reviewed - No data to display  RADIOLOGY   Official radiology report(s): DG Lumbar Spine 2-3 Views  Result Date: 11/10/2019 CLINICAL DATA:  Pain/injury. Right hip and back pain, previous L4-L5 surgery. EXAM: LUMBAR SPINE - 2-3 VIEW COMPARISON:  CT abdomen/pelvis 10/02/2018 FINDINGS: Five lumbar vertebrae in correlating with prior CT 10/02/2018. Mild lumbar levocurvature.  No significant spondylolisthesis. No lumbar compression deformity is demonstrated. Mild multilevel disc space narrowing, greatest at L4-L5. Left lateral osteophyte at L1-L2. Mild lower lumbar facet arthrosis. IMPRESSION: No lumbar compression fracture is demonstrated. Mild lumbar spondylosis as described. Mild lumbar levocurvature. Electronically Signed   By: 12/02/2018 DO   On: 11/10/2019 10:09   DG Femur Min 2 Views Right  Result Date: 11/10/2019 CLINICAL DATA:  Pain, injury. Additional provided: Injury to right upper leg with fall on Friday, right hip and back pain. EXAM: RIGHT FEMUR 2 VIEWS COMPARISON:  No pertinent prior  studies available for comparison. FINDINGS: There is normal bony alignment. No evidence of acute osseous or articular abnormality. The joint spaces are maintained. Incidentally noted fabella. IMPRESSION: No evidence of acute osseous or articular abnormality. Electronically Signed   By: Sunday DO   On: 11/10/2019 10:11    ____________________________________________   PROCEDURES  Procedure(s) performed (including Critical Care):  Procedures   ____________________________________________   INITIAL IMPRESSION / ASSESSMENT AND PLAN / ED COURSE  As part of my medical decision making, I reviewed the following data within the electronic MEDICAL RECORD NUMBER Notes from prior ED visits and [place] Controlled Substance Database  39 year old male presents to the ED with complaint of low back pain and a bruise to his right thigh after an accident that occurred at work 4 days ago.  Patient has been taking over-the-counter medication with little relief.  He states is been difficult to rest at night due to pain.  X-rays were negative and patient was reassured.  He is encouraged to  use ice to his muscles as needed for discomfort.  A prescription for Flexeril 10 mg for bedtime and Norco if needed for pain only when at home.  Patient is aware that both these medications could cause drowsiness increases risk for injury.  He was given a note stating that he was on limited duty for the next 3 to 4 days.  Patient is to follow-up with his PCP if any continued problems or return to the emergency department if any worsening of his symptoms.  ____________________________________________   FINAL CLINICAL IMPRESSION(S) / ED DIAGNOSES  Final diagnoses:  Contusion of right thigh, initial encounter  Strain of lumbar region, initial encounter  Fall, initial encounter     ED Discharge Orders         Ordered    cyclobenzaprine (FLEXERIL) 10 MG tablet  Daily at bedtime     Discontinue  Reprint     11/10/19 1031     HYDROcodone-acetaminophen (NORCO/VICODIN) 5-325 MG tablet  Every 6 hours PRN     Discontinue  Reprint     11/10/19 1031           Note:  This document was prepared using Dragon voice recognition software and may include unintentional dictation errors.    Tommi Rumps, PA-C 11/10/19 1532    Emily Filbert, MD 11/10/19 1535

## 2019-11-10 NOTE — ED Notes (Signed)
ED Provider at bedside. 

## 2019-11-12 ENCOUNTER — Telehealth: Payer: Self-pay | Admitting: Pharmacy Technician

## 2019-11-12 NOTE — Telephone Encounter (Signed)
Patient has Medicaid with prescription drug coverage.  No longer meets [facility]'s eligibility criteria.  Patient notified.  Sherilyn Dacosta Care Manager [facility]   [address] [place], [place]  [address]  November 12, 2019    Mr. Vernon Allen. [address] [place], [place]  [address]  Dear [name]:  This is to inform you that you are no longer eligible to receive medication assistance at [facility].  The reason(s) are:    _____Your total gross monthly household income exceeds 250% of the Federal Poverty Level.   _____Tangible assets (savings, checking, stocks/bonds, pension, retirement, etc.) exceeds our limit  __X__You are eligible to receive benefits from [facility] or HIV Medication            Assistance program _____You are eligible to receive benefits from a Medicare Part "D" plan _____You have prescription insurance  _____You are not an [facility] resident _____Failure to provide all requested proof of income information for 2021.    We regret that we are unable to help you at this time.  If your prescription coverage is terminated, please contact [facility], so that we may reassess your eligibility for our program.  If you have questions, we may be contacted at [phone].  Thank you,  [facility]

## 2021-01-24 IMAGING — CR DG FEMUR 2+V*R*
1 series · 4 of 4 positions shown · non-contrast
Comparison: No pertinent prior studies available for comparison.

CLINICAL DATA: Pain, injury. Additional provided: Injury to right
upper leg with fall on [REDACTED], right hip and back pain.

EXAM:
RIGHT FEMUR 2 VIEWS

[Series 1: dg femur, min 2 views right · 0.14mm/px · 4 of 4 slices shown]
[im 1/4]
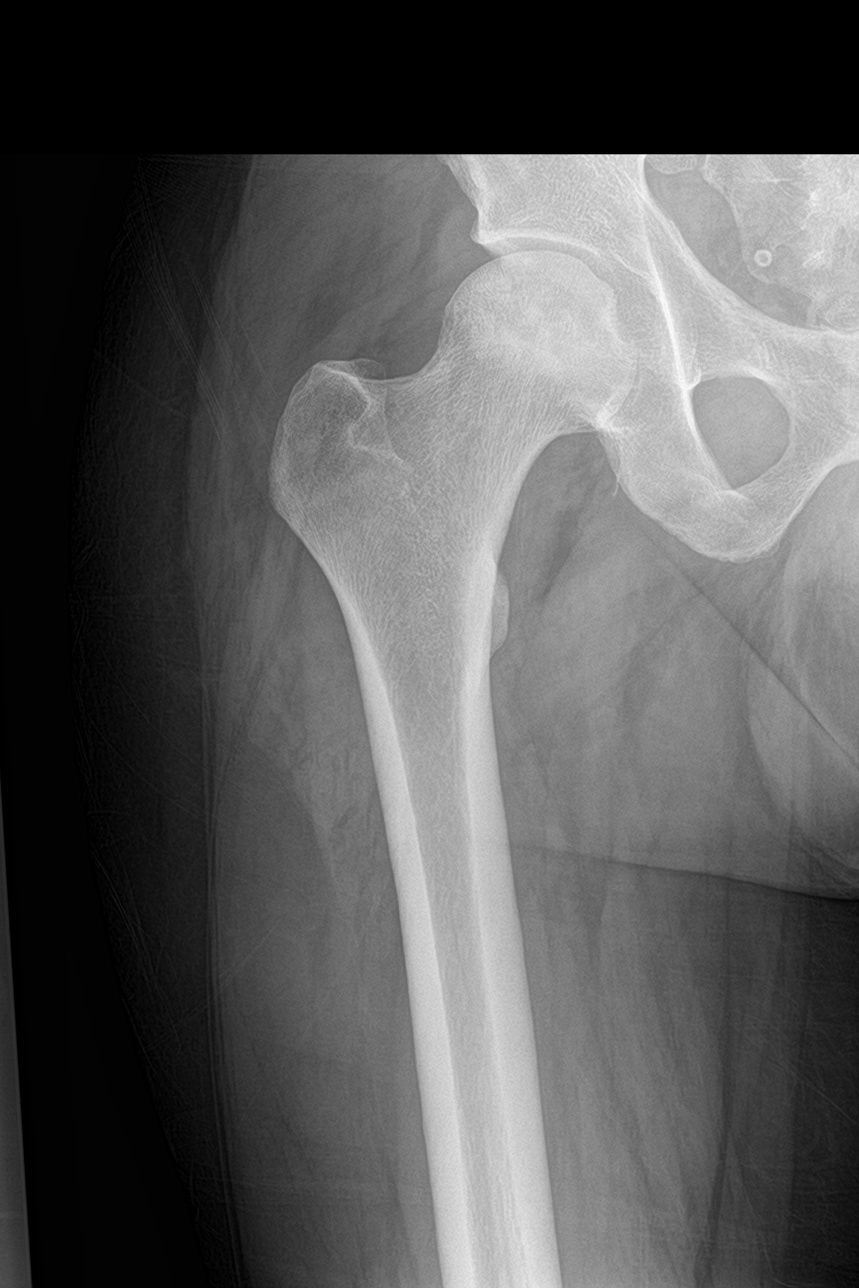
[im 2/4]
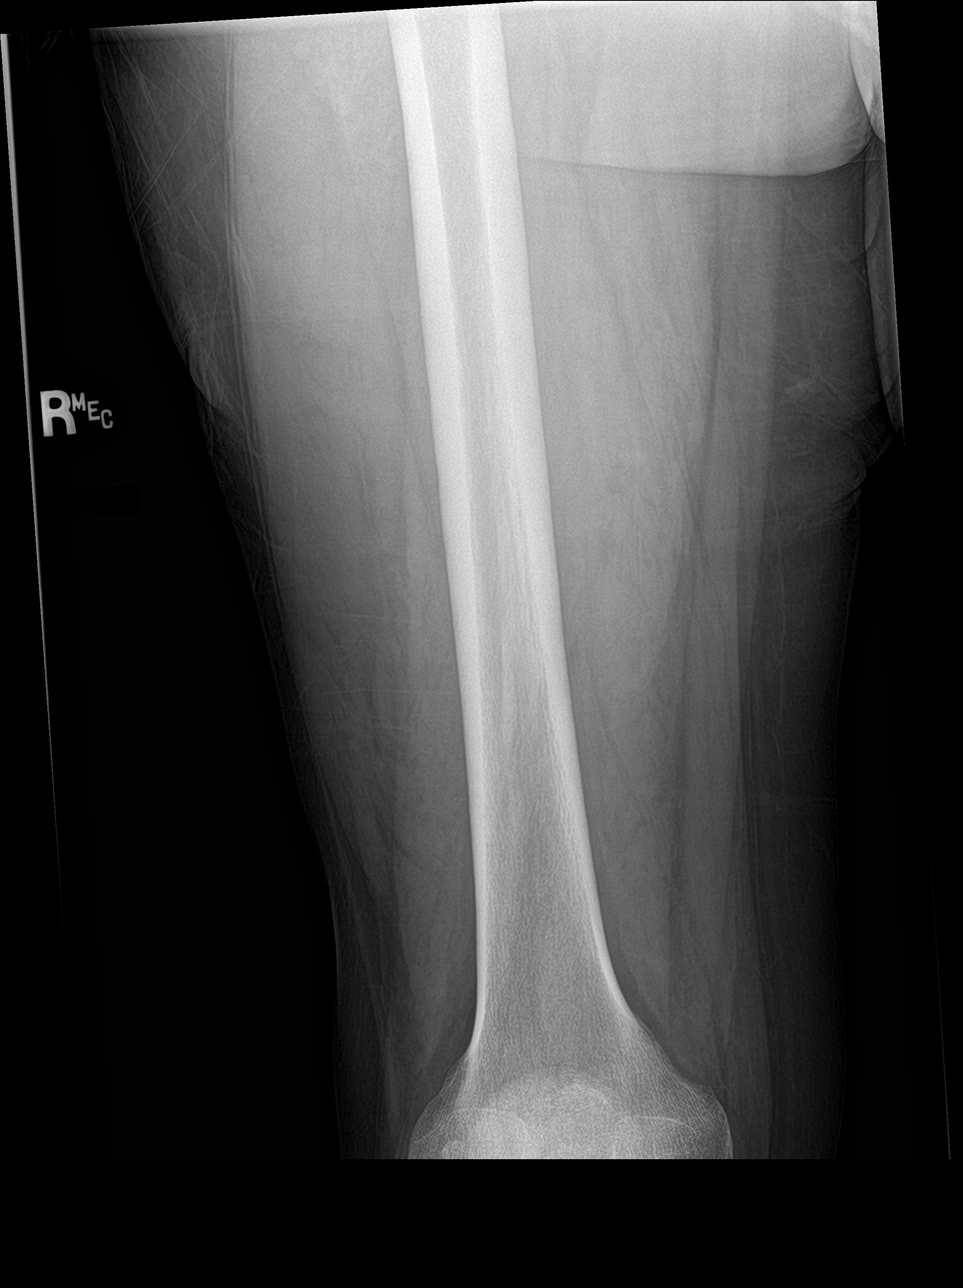
[im 3/4]
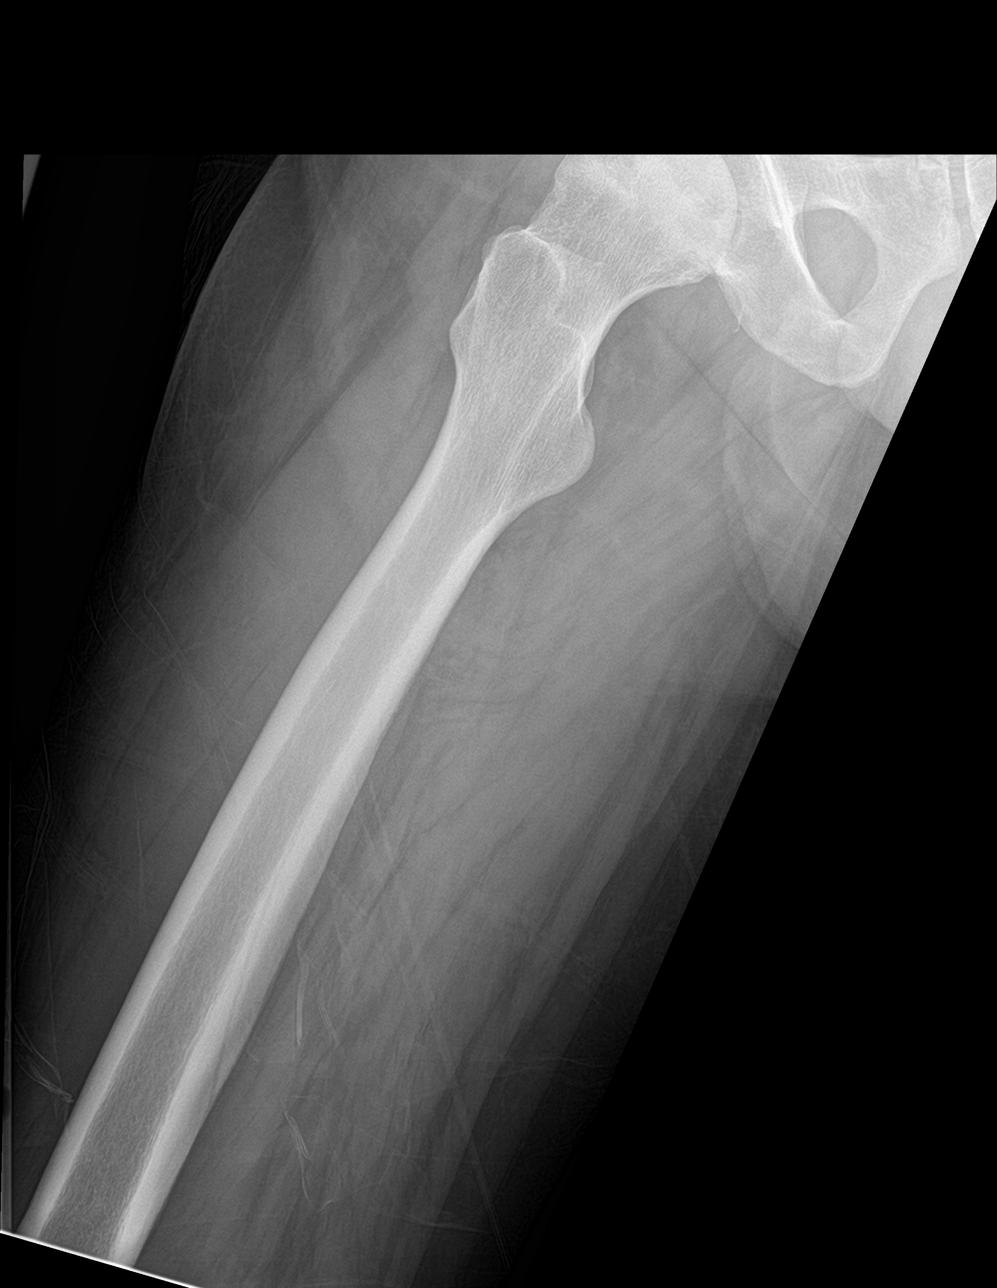
[im 4/4]
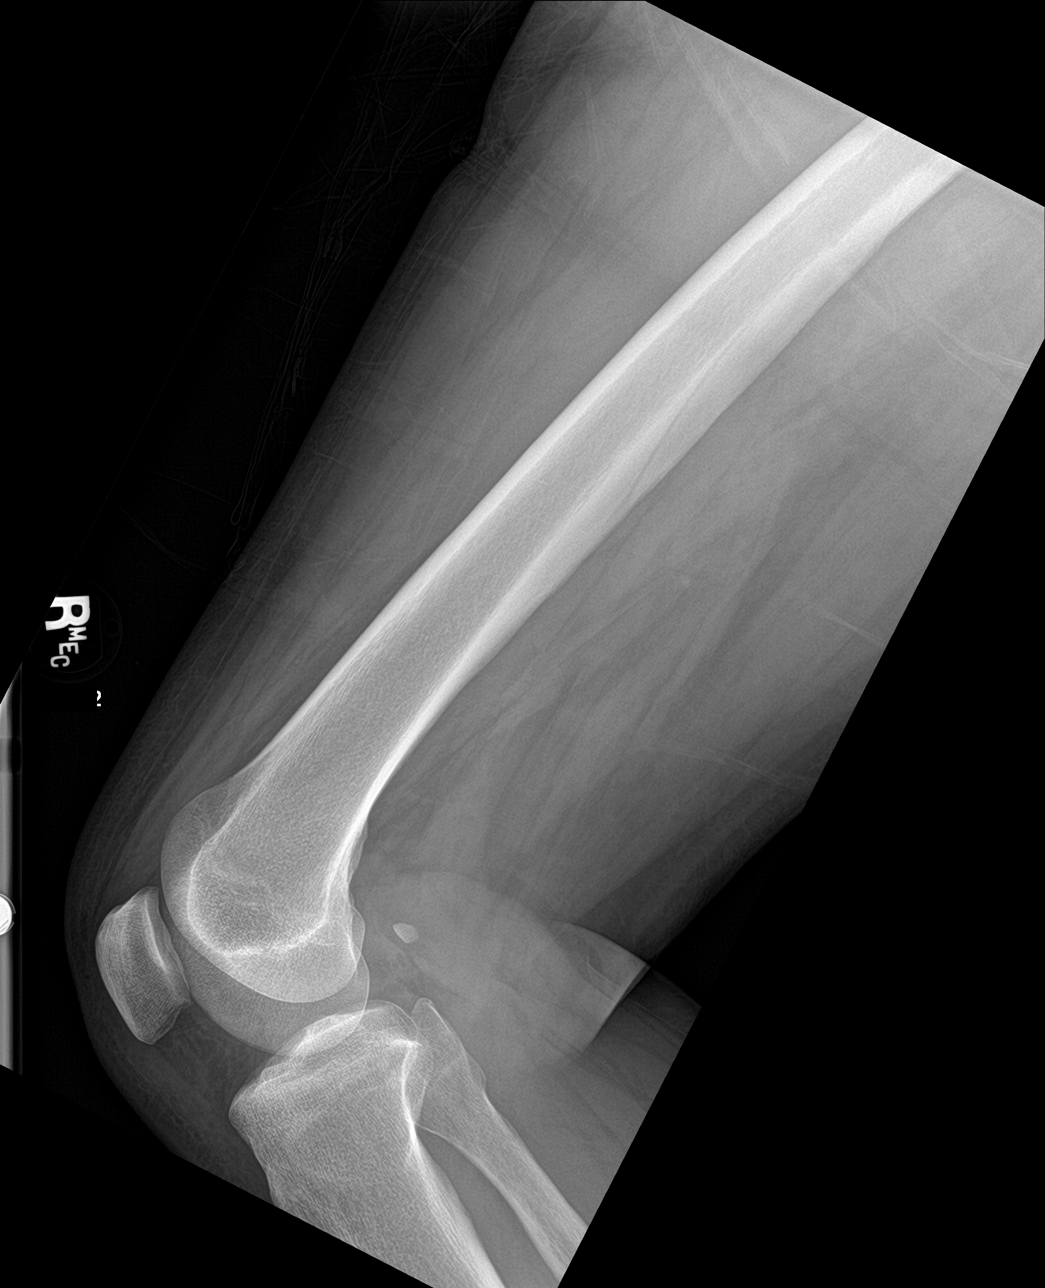

[4 of 4 positions shown; findings below may reference images not displayed]

FINDINGS: There is normal bony alignment.

No evidence of acute osseous or articular abnormality.

The joint spaces are maintained.

Incidentally noted fabella.
IMPRESSION: No evidence of acute osseous or articular abnormality.

## 2021-01-24 IMAGING — CR DG LUMBAR SPINE 2-3V
1 series · 3 of 3 positions shown · non-contrast
Comparison: CT abdomen/pelvis 10/02/2018

CLINICAL DATA: Pain/injury. Right hip and back pain, previous L4-L5
surgery.

EXAM:
LUMBAR SPINE - 2-3 VIEW

[Series 1: dg lumbar spine 2-3 views · 0.14mm/px · 3 of 3 slices shown]
[im 1/3]
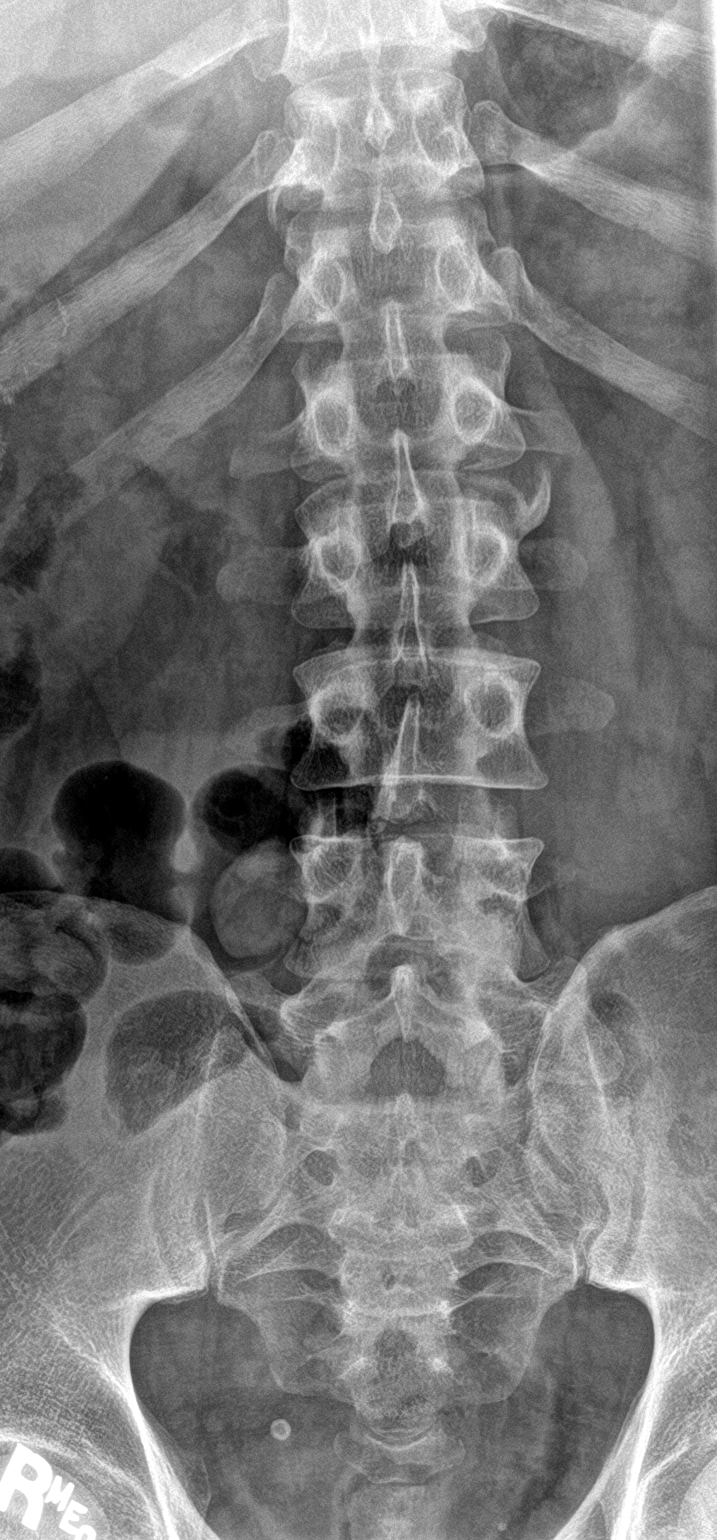
[im 2/3]
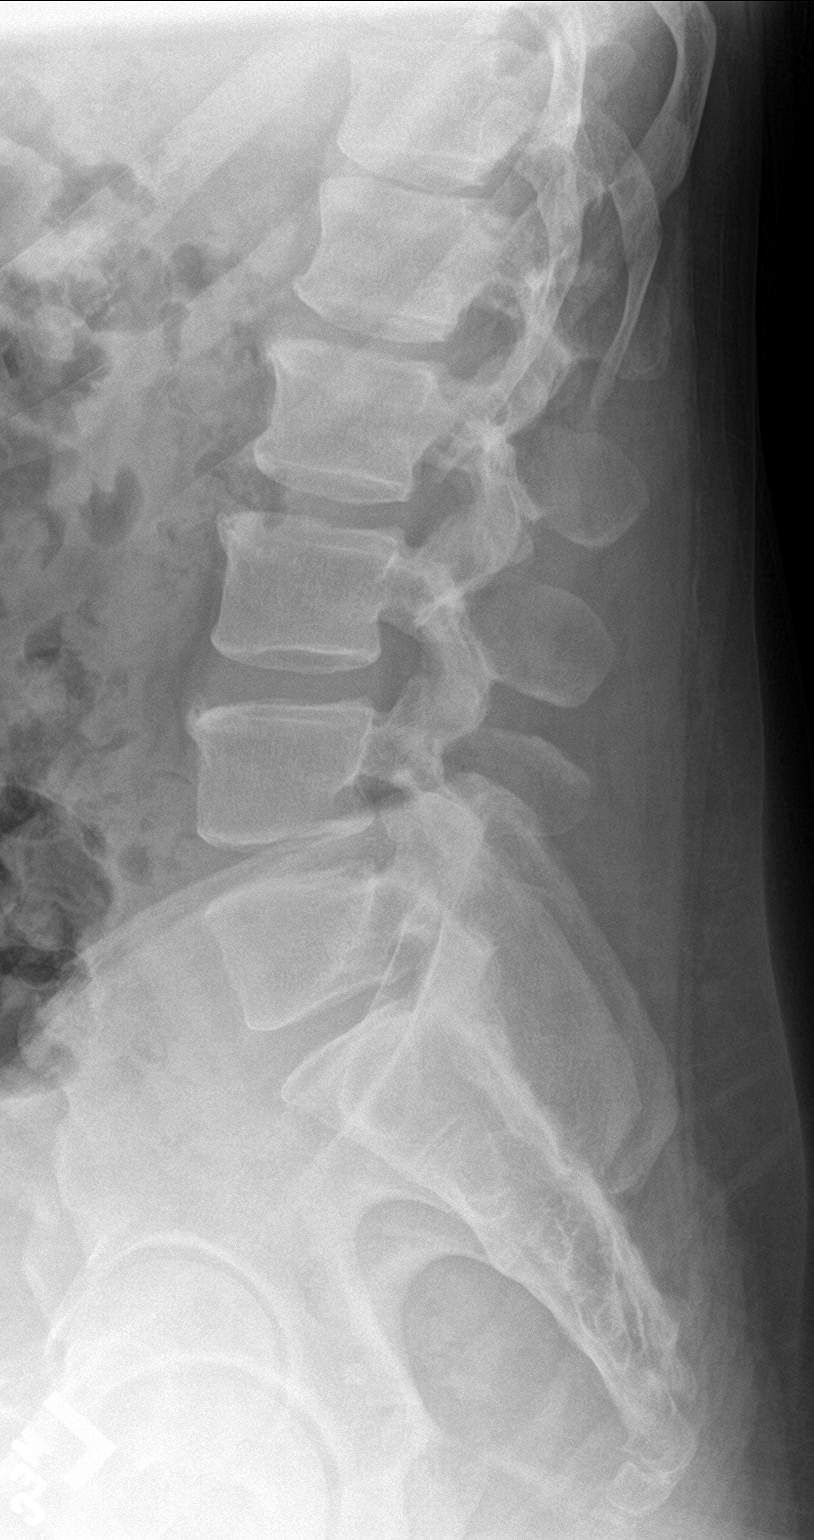
[im 3/3]
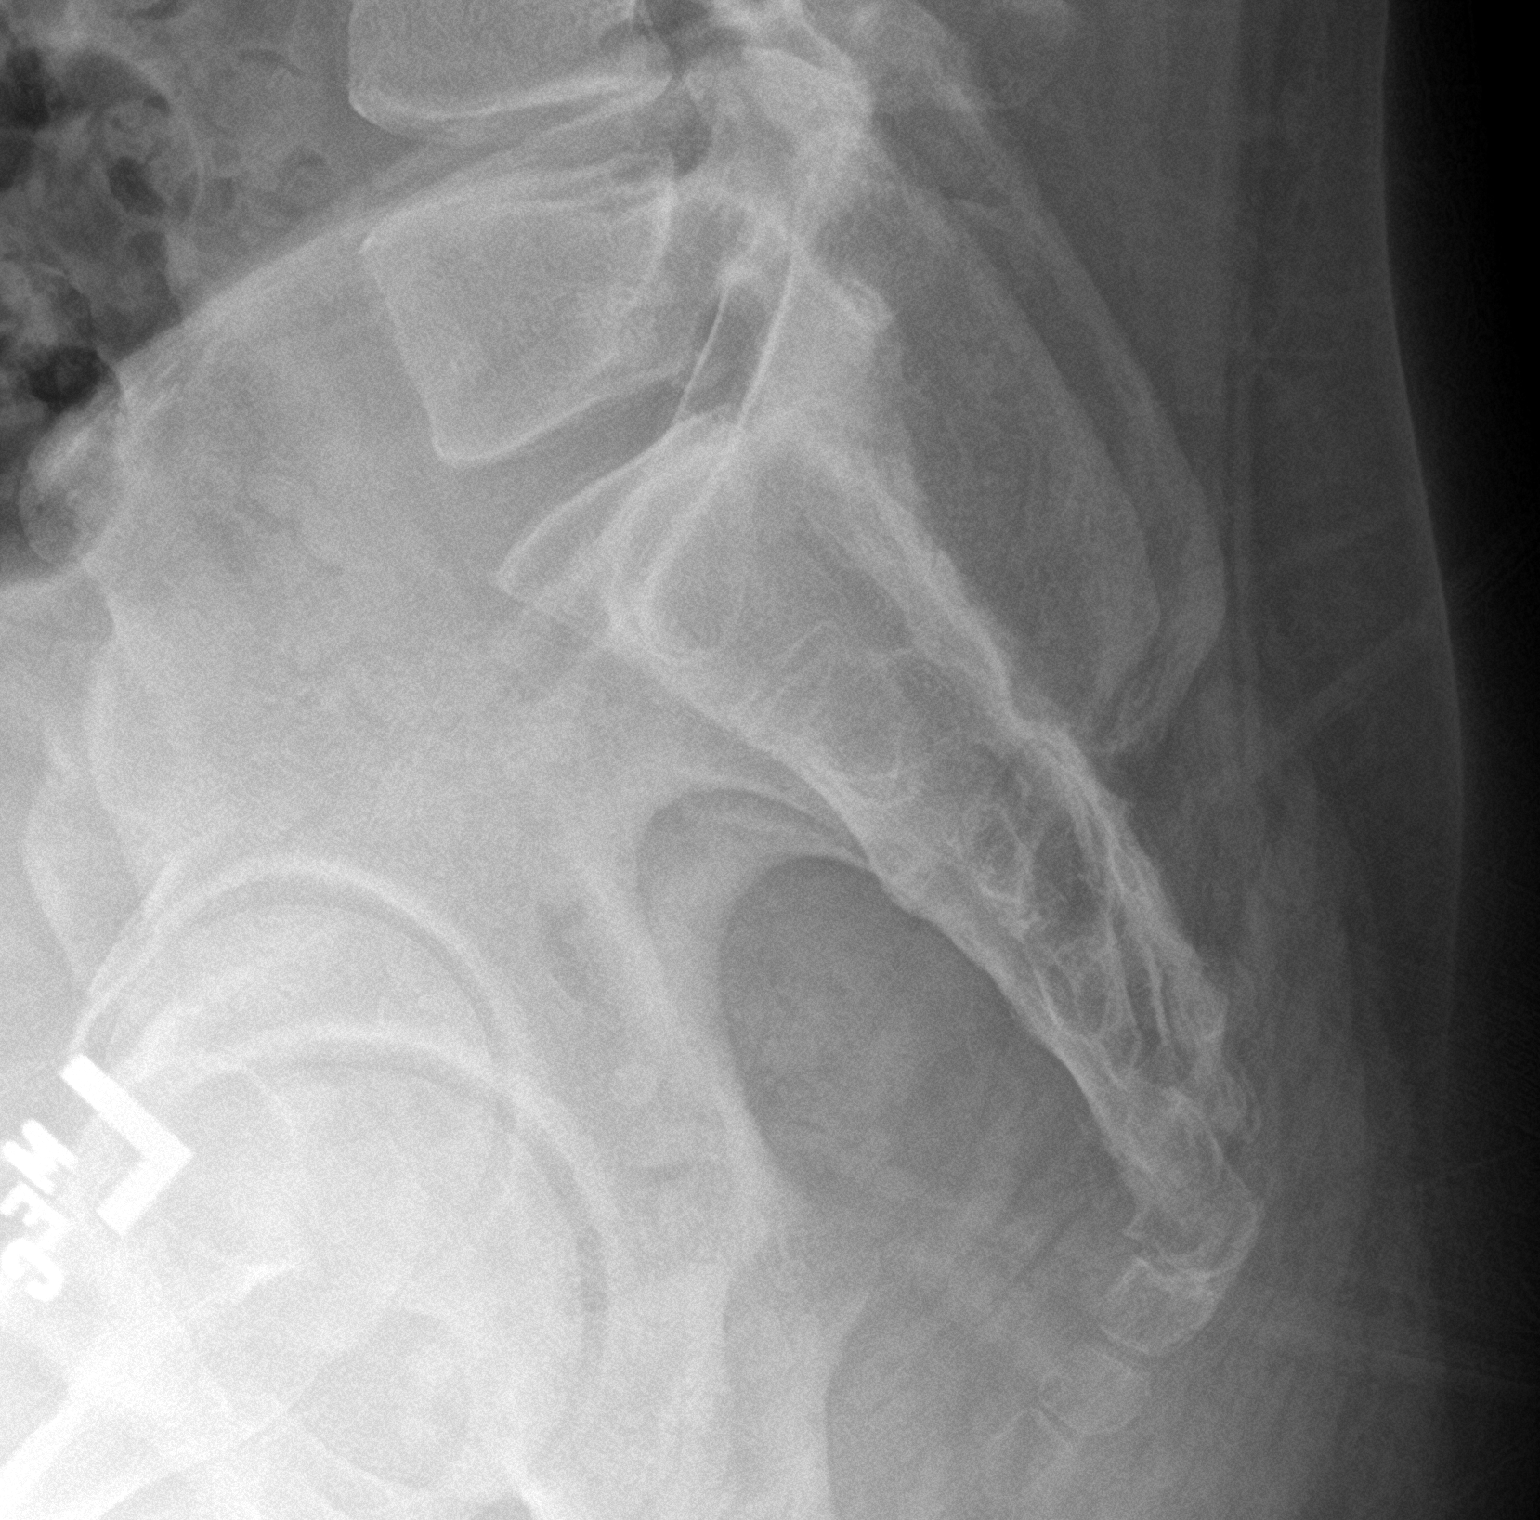

[3 of 3 positions shown; findings below may reference images not displayed]

FINDINGS: Five lumbar vertebrae in correlating with prior CT 10/02/2018.

Mild lumbar levocurvature.  No significant spondylolisthesis.

No lumbar compression deformity is demonstrated.

Mild multilevel disc space narrowing, greatest at L4-L5. Left
lateral osteophyte at L1-L2. Mild lower lumbar facet arthrosis.
IMPRESSION: No lumbar compression fracture is demonstrated.

Mild lumbar spondylosis as described.

Mild lumbar levocurvature.

## 2021-05-19 DIAGNOSIS — K611 Rectal abscess: Secondary | ICD-10-CM

## 2021-05-19 HISTORY — PX: INCISION AND DRAINAGE PERIRECTAL ABSCESS: SHX1804

## 2021-05-19 HISTORY — DX: Rectal abscess: K61.1

## 2021-05-22 DIAGNOSIS — F17200 Nicotine dependence, unspecified, uncomplicated: Secondary | ICD-10-CM | POA: Diagnosis present

## 2021-11-01 ENCOUNTER — Emergency Department
Admission: EM | Admit: 2021-11-01 | Discharge: 2021-11-01 | Disposition: A | Discharge status: Home / Self Care | Payer: No Typology Code available for payment source | Attending: Emergency Medicine | Admitting: Emergency Medicine

## 2021-11-01 ENCOUNTER — Emergency Department: Payer: No Typology Code available for payment source

## 2021-11-01 ENCOUNTER — Other Ambulatory Visit: Payer: Self-pay

## 2021-11-01 DIAGNOSIS — R109 Unspecified abdominal pain: Secondary | ICD-10-CM | POA: Diagnosis present

## 2021-11-01 DIAGNOSIS — M7918 Myalgia, other site: Secondary | ICD-10-CM

## 2021-11-01 LAB — CBC
HCT: 41.1 % (ref 39.0–52.0)
Hemoglobin: 13.6 g/dL (ref 13.0–17.0)
MCH: 30.5 pg (ref 26.0–34.0)
MCHC: 33.1 g/dL (ref 30.0–36.0)
MCV: 92.2 fL (ref 80.0–100.0)
Platelets: 278 10*3/uL (ref 150–400)
RBC: 4.46 MIL/uL (ref 4.22–5.81)
RDW: 13.9 % (ref 11.5–15.5)
WBC: 5.9 10*3/uL (ref 4.0–10.5)
nRBC: 0 % (ref 0.0–0.2)

## 2021-11-01 LAB — URINALYSIS, COMPLETE (UACMP) WITH MICROSCOPIC
Bacteria, UA: NONE SEEN
Bilirubin Urine: NEGATIVE
Glucose, UA: NEGATIVE mg/dL
Hgb urine dipstick: NEGATIVE
Ketones, ur: NEGATIVE mg/dL
Leukocytes,Ua: NEGATIVE
Nitrite: NEGATIVE
Protein, ur: NEGATIVE mg/dL
Specific Gravity, Urine: 1.024 (ref 1.005–1.030)
pH: 6 (ref 5.0–8.0)

## 2021-11-01 LAB — COMPREHENSIVE METABOLIC PANEL
ALT: 21 U/L (ref 0–44)
AST: 34 U/L (ref 15–41)
Albumin: 3.9 g/dL (ref 3.5–5.0)
Alkaline Phosphatase: 104 U/L (ref 38–126)
Anion gap: 7 (ref 5–15)
BUN: 12 mg/dL (ref 6–20)
CO2: 24 mmol/L (ref 22–32)
Calcium: 8.9 mg/dL (ref 8.9–10.3)
Chloride: 110 mmol/L (ref 98–111)
Creatinine, Ser: 0.89 mg/dL (ref 0.61–1.24)
GFR, Estimated: >60 mL/min (ref >60)
Glucose, Bld: 115 mg/dL — ABNORMAL HIGH (ref 70–99)
Potassium: 3.8 mmol/L (ref 3.5–5.1)
Sodium: 141 mmol/L (ref 135–145)
Total Bilirubin: 0.6 mg/dL (ref 0.3–1.2)
Total Protein: 7.2 g/dL (ref 6.5–8.1)

## 2021-11-01 MED ORDER — HYDROCODONE-ACETAMINOPHEN 5-325 MG PO TABS: 1.0000 | ORAL_TABLET | ORAL | 0 refills | Status: DC | PRN

## 2021-11-01 NOTE — ED Provider Notes (Signed)
[facility] Provider Note    Event Date/Time   First MD Initiated Contact with Patient 11/01/21 0800     (approximate)  History   Chief Complaint: Flank Pain  HPI  Vernon Allen is a 41 y.o. male with no significant past medical history presents emergency department for left flank pain.  According to the patient on Monday he was changing a semitruck tire when it fell off a truck bounced on the ground and hit him in the left flank.  He states he tried to move very quickly to avoid getting hit by the tire.  Patient has a history of lower back pain in the past status post back surgery.  He states since moving very quick he has been experiencing increased pain in the left flank.  He is not sure if it is from where the tire hit him or from his lower back from moving quickly to try to avoid getting hit.  Patient denies any pain radiating down the legs.  Denies any incontinence.  No numbness or weakness.  Physical Exam   Triage Vital Signs: ED Triage Vitals  Enc Vitals Group     BP 11/01/21 0758 127/74     Pulse Rate 11/01/21 0758 72     Resp 11/01/21 0758 18     Temp 11/01/21 0758 97.6 F (36.4 C)     Temp Source 11/01/21 0758 Oral     SpO2 11/01/21 0758 99 %     Weight 11/01/21 0759 210 lb (95.3 kg)     Height 11/01/21 0759 5\' 9"  (1.753 m)     Head Circumference --      Peak Flow --      Pain Score 11/01/21 0758 8     Pain Loc --      Pain Edu? --      Excl. in GC? --     Most recent vital signs: Vitals:   11/01/21 0758  BP: 127/74  Pulse: 72  Resp: 18  Temp: 97.6 F (36.4 C)  SpO2: 99%    General: Awake, no distress.  CV:  Good peripheral perfusion.  Regular rate and rhythm  Resp:  Normal effort.  Equal breath sounds bilaterally.  Abd:  No distention.  Soft, nontender.  No rebound or guarding.  No CVA tenderness   ED Results / Procedures / Treatments   RADIOLOGY  I have viewed and interpreted the CT images I do not see any obvious  kidney stone on my interpretation.  Radiology is read the CT scan as negative for acute abnormality.   MEDICATIONS ORDERED IN ED: Medications - No data to display   IMPRESSION / MDM / ASSESSMENT AND PLAN / ED COURSE  I reviewed the triage vital signs and the nursing notes.  Patient's presentation is most consistent with acute presentation with potential threat to life or bodily function.  Patient presents emergency department for left flank pain.  Differential would include local trauma/contusions although no bruising noted on examination, lower back pain, musculoskeletal pain, neuropathic pain.  No radiation down the leg to suggest sciatica.  No weakness numbness or incontinence.  We will check basic labs and obtain CT imaging of the abdomen/pelvis as a precaution.  Patient agreeable to plan of care.  Patient's work-up is overall reassuring.  CBC is normal, chemistry shows no concerning findings.  Urinalysis shows no concerning findings.  CT scan is negative.  Suspect more musculoskeletal pain we will discharge with a short course  of pain medication have the patient follow-up with his doctor.  Patient agreeable to plan.  FINAL CLINICAL IMPRESSION(S) / ED DIAGNOSES   Left flank pain    Note:  This document was prepared using Dragon voice recognition software and may include unintentional dictation errors.   Minna Antis, MD 11/01/21 [phone]

## 2021-11-01 NOTE — ED Triage Notes (Signed)
Pt here with left flank and back pain. Pt was working on a tractor trailer Monday and one of the tires fell and hit him. Pt ambulatory to triage. Pt having mild nausea this morning.

## 2021-11-01 NOTE — ED Notes (Signed)
Pt was hit by outer tire of tractor trailor tire 1 week ago. Pt complains of L flank and back pain. To CT now.

## 2021-11-06 ENCOUNTER — Other Ambulatory Visit: Payer: Self-pay | Admitting: Nurse Practitioner

## 2021-11-06 ENCOUNTER — Ambulatory Visit: Payer: Medicaid Other

## 2021-11-06 DIAGNOSIS — Z0000 Encounter for general adult medical examination without abnormal findings: Secondary | ICD-10-CM

## 2021-11-22 ENCOUNTER — Emergency Department
Admission: EM | Admit: 2021-11-22 | Discharge: 2021-11-22 | Disposition: A | Discharge status: Home / Self Care | Payer: No Typology Code available for payment source | Attending: Emergency Medicine | Admitting: Emergency Medicine

## 2021-11-22 ENCOUNTER — Encounter: Payer: Self-pay | Admitting: Emergency Medicine

## 2021-11-22 ENCOUNTER — Other Ambulatory Visit: Payer: Self-pay

## 2021-11-22 ENCOUNTER — Emergency Department: Payer: No Typology Code available for payment source

## 2021-11-22 DIAGNOSIS — S3991XA Unspecified injury of abdomen, initial encounter: Secondary | ICD-10-CM | POA: Insufficient documentation

## 2021-11-22 DIAGNOSIS — W228XXA Striking against or struck by other objects, initial encounter: Secondary | ICD-10-CM | POA: Insufficient documentation

## 2021-11-22 DIAGNOSIS — Y990 Civilian activity done for income or pay: Secondary | ICD-10-CM | POA: Diagnosis not present

## 2021-11-22 DIAGNOSIS — R1084 Generalized abdominal pain: Secondary | ICD-10-CM

## 2021-11-22 LAB — CBC WITH DIFFERENTIAL/PLATELET
Abs Immature Granulocytes: 0.05 10*3/uL (ref 0.00–0.07)
Basophils Absolute: 0.1 10*3/uL (ref 0.0–0.1)
Basophils Relative: 1 %
Eosinophils Absolute: 0.1 10*3/uL (ref 0.0–0.5)
Eosinophils Relative: 2 %
HCT: 39.6 % (ref 39.0–52.0)
Hemoglobin: 13.3 g/dL (ref 13.0–17.0)
Immature Granulocytes: 1 %
Lymphocytes Relative: 39 %
Lymphs Abs: 2.8 10*3/uL (ref 0.7–4.0)
MCH: 31.5 pg (ref 26.0–34.0)
MCHC: 33.6 g/dL (ref 30.0–36.0)
MCV: 93.8 fL (ref 80.0–100.0)
Monocytes Absolute: 0.7 10*3/uL (ref 0.1–1.0)
Monocytes Relative: 10 %
Neutro Abs: 3.5 10*3/uL (ref 1.7–7.7)
Neutrophils Relative %: 47 %
Platelets: 265 10*3/uL (ref 150–400)
RBC: 4.22 MIL/uL (ref 4.22–5.81)
RDW: 13.8 % (ref 11.5–15.5)
WBC: 7.3 10*3/uL (ref 4.0–10.5)
nRBC: 0 % (ref 0.0–0.2)

## 2021-11-22 LAB — COMPREHENSIVE METABOLIC PANEL
ALT: 24 U/L (ref 0–44)
AST: 43 U/L — ABNORMAL HIGH (ref 15–41)
Albumin: 4.1 g/dL (ref 3.5–5.0)
Alkaline Phosphatase: 96 U/L (ref 38–126)
Anion gap: 9 (ref 5–15)
BUN: 11 mg/dL (ref 6–20)
CO2: 25 mmol/L (ref 22–32)
Calcium: 9.1 mg/dL (ref 8.9–10.3)
Chloride: 108 mmol/L (ref 98–111)
Creatinine, Ser: 0.79 mg/dL (ref 0.61–1.24)
GFR, Estimated: >60 mL/min (ref >60)
Glucose, Bld: 98 mg/dL (ref 70–99)
Potassium: 3.2 mmol/L — ABNORMAL LOW (ref 3.5–5.1)
Sodium: 142 mmol/L (ref 135–145)
Total Bilirubin: 1.1 mg/dL (ref 0.3–1.2)
Total Protein: 7.6 g/dL (ref 6.5–8.1)

## 2021-11-22 LAB — URINALYSIS, ROUTINE W REFLEX MICROSCOPIC
Bacteria, UA: NONE SEEN
Bilirubin Urine: NEGATIVE
Glucose, UA: NEGATIVE mg/dL
Hgb urine dipstick: NEGATIVE
Ketones, ur: NEGATIVE mg/dL
Leukocytes,Ua: NEGATIVE
Nitrite: NEGATIVE
Protein, ur: NEGATIVE mg/dL
Specific Gravity, Urine: >1.046 — ABNORMAL HIGH (ref 1.005–1.030)
Squamous Epithelial / HPF: NONE SEEN (ref 0–5)
pH: 5 (ref 5.0–8.0)

## 2021-11-22 LAB — TROPONIN I (HIGH SENSITIVITY): Troponin I (High Sensitivity): 4 ng/L (ref <18)

## 2021-11-22 MED ORDER — IOHEXOL 300 MG/ML  SOLN
100.0000 mL | Freq: Once | INTRAMUSCULAR | Status: AC | PRN
  Administered 2021-11-22: 100 mL via INTRAVENOUS

## 2021-11-22 MED ORDER — NAPROXEN 500 MG PO TABS: 500.0000 mg | ORAL_TABLET | Freq: Two times a day (BID) | ORAL | 0 refills | Status: DC

## 2021-11-22 MED ORDER — ONDANSETRON 4 MG PO TBDP: 4.0000 mg | ORAL_TABLET | Freq: Three times a day (TID) | ORAL | 0 refills | Status: DC | PRN

## 2021-11-22 NOTE — ED Triage Notes (Signed)
Patient was sent by [facility] for further evaluation to left sided upper abdominal pain after an metal object from a tire projected off tire into patients abdomen at about 9:45am. Patient having tenderness to area and intermittent shortness of breath.

## 2021-11-22 NOTE — ED Provider Notes (Signed)
[facility] Provider Note    Event Date/Time   First MD Initiated Contact with Patient 11/22/21 1551     (approximate)   History   Chief Complaint: Abdominal Pain   HPI  Vernon Allen is a 41 y.o. male with no significant past medical history who reports being in his usual state of health when he suffered blunt trauma to the abdomen at about 9:45 AM today.  He was removing a [employer]atering [employer] truck wheel when a metal wedge that secures it in place came loose and was ejected at high velocity and hit him in the abdomen.  No vomiting, no chest pain or shortness of breath.  Hurts in the left upper quadrant worse with movement.  No other injuries.     Physical Exam   Triage Vital Signs: ED Triage Vitals  Enc Vitals Group     BP 11/22/21 1207 (!) 126/99     Pulse Rate 11/22/21 1207 81     Resp 11/22/21 1207 16     Temp 11/22/21 1207 98.8 F (37.1 C)     Temp Source 11/22/21 1207 Oral     SpO2 11/22/21 1207 100 %     Weight 11/22/21 1210 210 lb (95.3 kg)     Height 11/22/21 1210 5\' 9"  (1.753 m)     Head Circumference --      Peak Flow --      Pain Score 11/22/21 1208 8     Pain Loc --      Pain Edu? --      Excl. in GC? --     Most recent vital signs: Vitals:   11/22/21 1207  BP: (!) 126/99  Pulse: 81  Resp: 16  Temp: 98.8 F (37.1 C)  SpO2: 100%    General: Awake, no distress.  CV:  Good peripheral perfusion.  Regular rate and rhythm Resp:  Normal effort.  Clear to auscultation bilaterally Abd:  No distention.  Soft with generalized tenderness worse in the left upper quadrant.  Mild erythema, no wounds. Other:  No facial injuries, neuro intact.  Chest wall stable.   ED Results / Procedures / Treatments   Labs (all labs ordered are listed, but only abnormal results are displayed) Labs Reviewed  COMPREHENSIVE METABOLIC PANEL - Abnormal; Notable for the following components:      Result Value   Potassium 3.2 (*)    AST 43 (*)     All other components within normal limits  URINALYSIS, ROUTINE W REFLEX MICROSCOPIC - Abnormal; Notable for the following components:   Color, Urine YELLOW (*)    APPearance CLEAR (*)    Specific Gravity, Urine >1.046 (*)    All other components within normal limits  CBC WITH DIFFERENTIAL/PLATELET  TROPONIN I (HIGH SENSITIVITY)  TROPONIN I (HIGH SENSITIVITY)     EKG Interpreted by me Normal sinus rhythm rate of 79.  Normal axis intervals QRS ST segments and T waves.   RADIOLOGY CT chest abdomen pelvis interpreted by me, negative for abdominal free air or spleen laceration.  Radiology report reviewed, no acute findings.   PROCEDURES:  Procedures   MEDICATIONS ORDERED IN ED: Medications  iohexol (OMNIPAQUE) 300 MG/ML solution 100 mL (100 mLs Intravenous Contrast Given 11/22/21 1311)     IMPRESSION / MDM / ASSESSMENT AND PLAN / ED COURSE  I reviewed the triage vital signs and the nursing notes.  Differential diagnosis includes, but is not limited to, GI perforation, abdominal wall contusion, spleen laceration, embedded foreign body, kidney laceration  Patient's presentation is most consistent with acute complicated illness / injury requiring diagnostic workup.  Patient presents to the ED after focal blunt trauma to the abdomen.  No open wounds.  He is nontoxic with normal vital signs.  Labs and CT scan are all unremarkable.  Will treat with Aleve for contusion, ice therapy, follow-up with primary care.       FINAL CLINICAL IMPRESSION(S) / ED DIAGNOSES   Final diagnoses:  Generalized abdominal pain  Blunt trauma to abdomen, initial encounter     Rx / DC Orders   ED Discharge Orders          Ordered    naproxen (NAPROSYN) 500 MG tablet  2 times daily with meals        11/22/21 1619    ondansetron (ZOFRAN-ODT) 4 MG disintegrating tablet  Every 8 hours PRN        11/22/21 1619             Note:  This document was prepared  using Dragon voice recognition software and may include unintentional dictation errors.   Sharman Cheek, MD 11/22/21 1622

## 2021-11-22 NOTE — Discharge Instructions (Addendum)
Your lab tests and CT scan were all okay today.  Take anti-inflammatory medicine and nausea medicine as needed.  Use ice intermittently on the injured area of your abdomen to help minimize pain and swelling.

## 2021-11-22 NOTE — ED Provider Triage Note (Signed)
Emergency Medicine Provider Triage Evaluation Note  Vernon Allen , a 41 y.o. male  was evaluated in triage.  Pt complains of blunt abdominal/lower chest trauma on the left.  Patient was removing a bolt off of a tire when the plug and bolt were projected into his abdomen.  States did not break into the skin but is having pain across the upper abdomen and lower chest.  States shortness of breath come and go.  Incident happened at work prior to arrival.  Review of Systems  Positive:  Negative:   Physical Exam  BP (!) 126/99   Pulse 81   Temp 98.8 F (37.1 C) (Oral)   Resp 16   Ht 5\' 9"  (1.753 m)   Wt 95.3 kg   SpO2 100%   BMI 31.01 kg/m  Gen:   Awake, no distress   Resp:  Normal effort  MSK:   Moves extremities without difficulty  Other:    Medical Decision Making  Medically screening exam initiated at 12:17 PM.  Appropriate orders placed.  was informed that the remainder of the evaluation will be completed by another provider, this initial triage assessment does not replace that evaluation, and the importance of remaining in the ED until their evaluation is complete.  Due to the projectile injury to the abdomen and lower ribs will order CT chest abdomen pelvis with IV contrast   Fenton Malling, PA-C 11/22/21 1218

## 2022-03-02 ENCOUNTER — Ambulatory Visit (INDEPENDENT_AMBULATORY_CARE_PROVIDER_SITE_OTHER): Payer: No Typology Code available for payment source | Admitting: Nurse Practitioner

## 2022-03-02 ENCOUNTER — Encounter: Payer: Self-pay | Admitting: Nurse Practitioner

## 2022-03-02 VITALS — BP 143/93 | HR 76 | Temp 98.5°F | Ht 69.0 in | Wt 235.4 lb

## 2022-03-02 DIAGNOSIS — E669 Obesity, unspecified: Secondary | ICD-10-CM | POA: Diagnosis not present

## 2022-03-02 DIAGNOSIS — Z716 Tobacco abuse counseling: Secondary | ICD-10-CM

## 2022-03-02 DIAGNOSIS — M1990 Unspecified osteoarthritis, unspecified site: Secondary | ICD-10-CM

## 2022-03-02 DIAGNOSIS — Z6834 Body mass index (BMI) 34.0-34.9, adult: Secondary | ICD-10-CM

## 2022-03-02 DIAGNOSIS — R030 Elevated blood-pressure reading, without diagnosis of hypertension: Secondary | ICD-10-CM

## 2022-03-02 MED ORDER — IBUPROFEN 800 MG PO TABS: 800.0000 mg | ORAL_TABLET | Freq: Three times a day (TID) | ORAL | 0 refills | Status: DC | PRN

## 2022-03-02 MED ORDER — CYCLOBENZAPRINE HCL 5 MG PO TABS: 5.0000 mg | ORAL_TABLET | Freq: Three times a day (TID) | ORAL | 1 refills | Status: DC | PRN

## 2022-03-02 NOTE — Progress Notes (Signed)
+  New Patient Office Visit  Subjective    Patient ID: Vernon Allen, male    DOB: 05/06/1980  Age: 41 y.o. MRN: [id number]  CC:  Chief Complaint  Patient presents with   Establish Care    Pain was told by ER from arthritis. Lower back and right leg hurts like a toothache. Interested in cortisone shots, cannot be out of work.BC arthritis broke him out and naproxen made him have GI upset. Needs flu shot.    HPI [name] presents to establish care.  Back Pain This is a chronic problem. The current episode started more than 1 month ago. The problem occurs constantly. The problem has been waxing and waning since onset. The pain is present in the gluteal and lumbar spine. Quality: thobbing. The pain radiates to the right thigh and left thigh. The pain is at a severity of 8/10. The pain is moderate. The pain is Worse during the day. The symptoms are aggravated by bending, standing and twisting. Stiffness is present All day. Associated symptoms include leg pain. Pertinent negatives include no abdominal pain, chest pain, headaches, tingling or weakness. Risk factors include obesity. He has tried analgesics and NSAIDs for the symptoms. The treatment provided mild relief.  Patient takes advil OTC without any sideeffects.   Outpatient Encounter Medications as of 03/02/2022  Medication Sig   cyclobenzaprine (FLEXERIL) 5 MG tablet Take 1 tablet (5 mg total) by mouth 3 (three) times daily as needed for muscle spasms.   ibuprofen (ADVIL) 800 MG tablet Take 1 tablet (800 mg total) by mouth every 8 (eight) hours as needed.   [DISCONTINUED] bisacodyl (BISACODYL) 5 MG EC tablet Take 4 tablets (5 mg each) at 8 am the day before surgery.   [DISCONTINUED] cyclobenzaprine (FLEXERIL) 10 MG tablet Take 1 tablet (10 mg total) by mouth at bedtime.   [DISCONTINUED] HYDROcodone-acetaminophen (NORCO/VICODIN) 5-325 MG tablet Take 1 tablet by mouth every 4 (four) hours as needed for moderate pain.    [DISCONTINUED] Naphazoline HCl (CLEAR EYES OP) Place 1 drop into both eyes daily as needed (allergies).   [DISCONTINUED] naproxen (NAPROSYN) 500 MG tablet Take 1 tablet (500 mg total) by mouth 2 (two) times daily with a meal.   [DISCONTINUED] ondansetron (ZOFRAN-ODT) 4 MG disintegrating tablet Take 1 tablet (4 mg total) by mouth every 8 (eight) hours as needed for nausea or vomiting.   [DISCONTINUED] Pseudoeph-Doxylamine-DM-APAP (NYQUIL PO) Take 1 Dose by mouth at bedtime as needed (sleep).   No facility-administered encounter medications on file as of 03/02/2022.    Past Medical History:  Diagnosis Date   Allergy to alpha-gal    Anxiety     Past Surgical History:  Procedure Laterality Date   BACK SURGERY     LAPAROSCOPIC APPENDECTOMY N/A 10/09/2018   Procedure: APPENDECTOMY LAPAROSCOPIC ATTEMPTED;  Surgeon: Leafy Ro, MD;  Location: [facility] ORS;  Service: General;  Laterality: N/A;   PARTIAL COLECTOMY Right 10/09/2018   Procedure: PARTIAL COLECTOMY;  Surgeon: Leafy Ro, MD;  Location: [facility] ORS;  Service: General;  Laterality: Right;    History reviewed. No pertinent family history.  Social History   Socioeconomic History   Marital status: Divorced    Spouse name: Not on file   Number of children: Not on file   Years of education: Not on file   Highest education level: Not on file  Occupational History   Not on file  Tobacco Use   Smoking status: Some Days    Packs/day:  0.25    Types: Cigarettes   Smokeless tobacco: Never  Vaping Use   Vaping Use: Never used  Substance and Sexual Activity   Alcohol use: Yes    Comment: (2) 12 packs/week   Drug use: Yes    Types: Marijuana   Sexual activity: Not on file  Other Topics Concern   Not on file  Social History Narrative   Not on file   Social Determinants of Health   Financial Resource Strain: Not on file  Food Insecurity: Not on file  Transportation Needs: Not on file  Physical Activity: Not on file  Stress:  Not on file  Social Connections: Not on file  Intimate Partner Violence: Not on file    Review of Systems  Constitutional: Negative.   HENT: Negative.    Eyes:  Negative for blurred vision and discharge.  Respiratory:  Negative for cough and shortness of breath.   Cardiovascular:  Negative for chest pain and leg swelling.  Gastrointestinal:  Negative for abdominal pain.  Genitourinary: Negative.   Musculoskeletal:  Positive for back pain.  Skin: Negative.   Neurological:  Negative for tingling, weakness and headaches.  Psychiatric/Behavioral:  Negative for depression, substance abuse and suicidal ideas.         Objective    BP (!) 143/93   Pulse 76   Temp 98.5 F (36.9 C) (Temporal)   Ht 5\' 9"  (1.753 m)   Wt 235 lb 6.4 oz (106.8 kg)   SpO2 98%   BMI 34.76 kg/m   Physical Exam Constitutional:      Appearance: He is obese.  HENT:     Head: Normocephalic and atraumatic.     Right Ear: Tympanic membrane normal.     Left Ear: Tympanic membrane normal.     Mouth/Throat:     Mouth: Mucous membranes are moist.  Eyes:     Conjunctiva/sclera: Conjunctivae normal.     Pupils: Pupils are equal, round, and reactive to light.  Cardiovascular:     Rate and Rhythm: Normal rate and regular rhythm.     Pulses: Normal pulses.  Pulmonary:     Effort: Pulmonary effort is normal.     Breath sounds: Normal breath sounds.  Abdominal:     General: Bowel sounds are normal. There is no distension.     Palpations: Abdomen is soft.     Hernia: No hernia is present.  Musculoskeletal:        General: Normal range of motion.  Skin:    Capillary Refill: Capillary refill takes less than 2 seconds.  Neurological:     General: No focal deficit present.     Mental Status: He is alert and oriented to person, place, and time. Mental status is at baseline.  Psychiatric:        Mood and Affect: Mood normal.        Behavior: Behavior normal.        Thought Content: Thought content normal.         Judgment: Judgment normal.         Assessment & Plan:   Problem List Items Addressed This Visit       Musculoskeletal and Integument   Arthritis - Primary    Continue ibuprofen 800 mg and Flexeril 5 mg. We will refer him to Ortho.      Relevant Medications   cyclobenzaprine (FLEXERIL) 5 MG tablet   ibuprofen (ADVIL) 800 MG tablet     Other   Elevated blood  pressure reading without diagnosis of hypertension    Patient BP  Vitals:   03/02/22 1407 03/02/22 1409  BP: (!) 144/98 (!) 143/93    in the office today. Encourage patient for lifestyle interventions to control blood pressure. Encourage patient to check blood pressure at home and bring readings to next appointment. We will continue to monitor and if needed we will start him on medication.       Class 1 obesity without serious comorbidity with body mass index (BMI) of 34.0 to 34.9 in adult    Body mass index is 34.76 kg/m. Advised pt to lose weight. Advised patient to avoid trans fat, fatty and fried food. Follow a regular physical activity schedule. Went over the risk of chronic diseases with increased weight.          Tobacco abuse counseling    Smoking cessation was discussed, 5-7 minutes was spent of this topic specifically.         No follow-ups on file.   Kara Dies, NP

## 2022-03-22 DIAGNOSIS — R030 Elevated blood-pressure reading, without diagnosis of hypertension: Secondary | ICD-10-CM | POA: Insufficient documentation

## 2022-03-22 DIAGNOSIS — M1990 Unspecified osteoarthritis, unspecified site: Secondary | ICD-10-CM | POA: Insufficient documentation

## 2022-03-22 DIAGNOSIS — E669 Obesity, unspecified: Secondary | ICD-10-CM | POA: Insufficient documentation

## 2022-03-22 DIAGNOSIS — Z716 Tobacco abuse counseling: Secondary | ICD-10-CM | POA: Insufficient documentation

## 2022-03-22 NOTE — Assessment & Plan Note (Signed)
Continue ibuprofen 800 mg and Flexeril 5 mg. We will refer him to Ortho.

## 2022-03-22 NOTE — Assessment & Plan Note (Signed)
Body mass index is 34.76 kg/m. Advised pt to lose weight. Advised patient to avoid trans fat, fatty and fried food. Follow a regular physical activity schedule. Went over the risk of chronic diseases with increased weight.

## 2022-03-22 NOTE — Assessment & Plan Note (Signed)
Patient BP  Vitals:   03/02/22 1407 03/02/22 1409  BP: (!) 144/98 (!) 143/93    in the office today. Encourage patient for lifestyle interventions to control blood pressure. Encourage patient to check blood pressure at home and bring readings to next appointment. We will continue to monitor and if needed we will start him on medication.

## 2022-03-22 NOTE — Assessment & Plan Note (Signed)
Smoking cessation was discussed, 5-7 minutes was spent of this topic specifically.   

## 2022-11-23 ENCOUNTER — Other Ambulatory Visit: Payer: Self-pay

## 2022-11-23 ENCOUNTER — Encounter: Payer: Self-pay | Admitting: Intensive Care

## 2022-11-23 ENCOUNTER — Emergency Department: Payer: 59

## 2022-11-23 ENCOUNTER — Emergency Department
Admission: EM | Admit: 2022-11-23 | Discharge: 2022-11-23 | Disposition: A | Discharge status: Home / Self Care | Payer: 59 | Attending: Student in an Organized Health Care Education/Training Program | Admitting: Student in an Organized Health Care Education/Training Program

## 2022-11-23 DIAGNOSIS — F1029 Alcohol dependence with unspecified alcohol-induced disorder: Secondary | ICD-10-CM | POA: Insufficient documentation

## 2022-11-23 DIAGNOSIS — S46912A Strain of unspecified muscle, fascia and tendon at shoulder and upper arm level, left arm, initial encounter: Secondary | ICD-10-CM | POA: Insufficient documentation

## 2022-11-23 DIAGNOSIS — M25512 Pain in left shoulder: Secondary | ICD-10-CM | POA: Diagnosis present

## 2022-11-23 DIAGNOSIS — X509XXA Other and unspecified overexertion or strenuous movements or postures, initial encounter: Secondary | ICD-10-CM | POA: Insufficient documentation

## 2022-11-23 MED ORDER — METHYLPREDNISOLONE 4 MG PO TBPK: ORAL_TABLET | ORAL | 0 refills | Status: DC

## 2022-11-23 MED ORDER — BACLOFEN 10 MG PO TABS: 10.0000 mg | ORAL_TABLET | Freq: Three times a day (TID) | ORAL | 0 refills | Status: AC

## 2022-11-23 NOTE — ED Provider Notes (Signed)
[facility] Provider Note    Event Date/Time   First MD Initiated Contact with Patient 11/23/22 [phone]     (approximate)   History   Back Pain and Shoulder Pain   HPI  Vernon Allen is a 42 y.o. male with history of EtOH abuse, alpha gal, back pain and anxiety presents emergency department with concerns of left shoulder pain.  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  States has had left shoulder pain for about 7 days.  Is gradually getting worse.  Patient states also trying to cut back on alcohol is having night sweats at night.  Denies chest pain or shortness of breath.  States when he drinks heavily sometimes he is seeing blood in his stool.  This is not on a regular basis.  It is bright red on toilet paper.      Physical Exam   Triage Vital Signs: ED Triage Vitals  Encounter Vitals Group     BP 11/23/22 0734 (!) 141/101     Systolic BP Percentile --      Diastolic BP Percentile --      Pulse Rate 11/23/22 0734 79     Resp 11/23/22 0734 16     Temp 11/23/22 0734 98 F (36.7 C)     Temp Source 11/23/22 0734 Oral     SpO2 11/23/22 0734 97 %     Weight 11/23/22 0736 230 lb (104.3 kg)     Height 11/23/22 0736 5\' 10"  (1.778 m)     Head Circumference --      Peak Flow --      Pain Score 11/23/22 0736 10     Pain Loc --      Pain Education --      Exclude from Growth Chart --     Most recent vital signs: Vitals:   11/23/22 0734  BP: (!) 141/101  Pulse: 79  Resp: 16  Temp: 98 F (36.7 C)  SpO2: 97%     General: Awake, no distress.   CV:  Good peripheral perfusion. regular rate and  rhythm Resp:  Normal effort. Lungscta Abd:  No distention.   Other:  Left shoulder tender at the joint, full range of motion, neurovascular intact, lumbar spine nontender   ED Results / Procedures / Treatments   Labs (all labs ordered are listed, but only abnormal  results are displayed) Labs Reviewed - No data to display   EKG     RADIOLOGY X-ray of the left shoulder    PROCEDURES:   Procedures   MEDICATIONS ORDERED IN ED: Medications - No data to display   IMPRESSION / MDM / ASSESSMENT AND PLAN / ED COURSE  I reviewed the triage vital signs and the nursing notes.                              Differential diagnosis includes, but is not limited to, rotator cuff tear, left shoulder strain, bursitis  Patient's presentation is most consistent with acute complicated illness / injury requiring diagnostic workup.   X-ray left shoulder independently reviewed interpreted by me as being negative for any acute abnormality  Explained these findings to the patient.  We had long discussion about alcohol abuse and detox.  He is not ready to go through detox yet.  States is going to  continue to cut back.  He was given information for [facility] for counseling.  Encouraged him to go to AA.  Explained to him that if he is going to quit drinking completely he may need to come to the hospital for detox as he is drinking more than 12 beers a day.  Patient states he understands.  He was given a prescription for Medrol Dosepak for the left shoulder.  I have concerns this is liver enzymes are elevated and with the questionable GI bleeding with heavy alcohol use do not feel that we should use NSAIDs or Tylenol.  Also given prescription for baclofen.  Follow-up with his regular doctor.  Follow-up with orthopedics.  Return if worsening.  He is in agreement treatment plan.  Discharged in stable condition.      FINAL CLINICAL IMPRESSION(S) / ED DIAGNOSES   Final diagnoses:  Strain of left shoulder, initial encounter  Alcohol dependence with unspecified alcohol-induced disorder (HCC)     Rx / DC Orders   ED Discharge Orders          Ordered    methylPREDNISolone (MEDROL DOSEPAK) 4 MG TBPK tablet        11/23/22 0951    baclofen (LIORESAL) 10 MG tablet  3  times daily        11/23/22 1610             Note:  This document was prepared using Dragon voice recognition software and may include unintentional dictation errors.    Faythe Ghee, PA-C 11/23/22 1305    Willy Eddy, MD 11/23/22 1434

## 2022-11-23 NOTE — ED Notes (Signed)
See triage notes. Patient stated he has been having hip and back pain for months. Patient also complaining of left shoulder pain that started three days ago. Patient said he was laying in bed when it felt like something caught and tore. Patient does a lot of heavy lifting at work.

## 2022-11-23 NOTE — ED Triage Notes (Signed)
Patient c/o back pain that started lower back and has traveled up to shoulders and arms. Reports history of arthritis. Started 7 days ago and has gradually gotten worse

## 2022-11-23 NOTE — Discharge Instructions (Signed)
Follow-up with orthopedics for the left shoulder.  Follow-up with [facility] when you are ready.  I gave you information about shoulder pain and alcohol withdrawal.  If you decide to quit drinking please come to the hospital.  It is dangerous to withdrawal from alcohol without help

## 2023-02-21 DIAGNOSIS — Z91018 Allergy to other foods: Secondary | ICD-10-CM | POA: Insufficient documentation

## 2023-02-22 DIAGNOSIS — M87051 Idiopathic aseptic necrosis of right femur: Secondary | ICD-10-CM

## 2023-02-22 HISTORY — DX: Idiopathic aseptic necrosis of right femur: M87.051

## 2023-04-04 ENCOUNTER — Other Ambulatory Visit: Payer: Self-pay | Admitting: Surgery

## 2023-04-05 ENCOUNTER — Encounter: Payer: Self-pay | Admitting: Surgery

## 2023-04-05 ENCOUNTER — Other Ambulatory Visit: Payer: Self-pay

## 2023-04-05 ENCOUNTER — Encounter
Admission: RE | Admit: 2023-04-05 | Discharge: 2023-04-05 | Disposition: A | Discharge status: Home / Self Care | Payer: 59 | Source: Ambulatory Visit | Attending: Surgery | Admitting: Surgery

## 2023-04-05 DIAGNOSIS — Z01810 Encounter for preprocedural cardiovascular examination: Secondary | ICD-10-CM | POA: Diagnosis present

## 2023-04-05 DIAGNOSIS — Z01812 Encounter for preprocedural laboratory examination: Secondary | ICD-10-CM | POA: Diagnosis present

## 2023-04-05 DIAGNOSIS — Z01818 Encounter for other preprocedural examination: Secondary | ICD-10-CM | POA: Insufficient documentation

## 2023-04-05 HISTORY — DX: Alcohol abuse, uncomplicated: F10.10

## 2023-04-05 HISTORY — DX: Bipolar disorder, unspecified: F31.9

## 2023-04-05 HISTORY — DX: Essential (primary) hypertension: I10

## 2023-04-05 LAB — COMPREHENSIVE METABOLIC PANEL
ALT: 16 U/L (ref 0–44)
AST: 32 U/L (ref 15–41)
Albumin: 3.7 g/dL (ref 3.5–5.0)
Alkaline Phosphatase: 99 U/L (ref 38–126)
Anion gap: 9 (ref 5–15)
BUN: 11 mg/dL (ref 6–20)
CO2: 25 mmol/L (ref 22–32)
Calcium: 8.4 mg/dL — ABNORMAL LOW (ref 8.9–10.3)
Chloride: 101 mmol/L (ref 98–111)
Creatinine, Ser: 0.66 mg/dL (ref 0.61–1.24)
GFR, Estimated: >60 mL/min (ref >60)
Glucose, Bld: 83 mg/dL (ref 70–99)
Potassium: 3.2 mmol/L — ABNORMAL LOW (ref 3.5–5.1)
Sodium: 135 mmol/L (ref 135–145)
Total Bilirubin: 0.9 mg/dL (ref <1.2)
Total Protein: 7.4 g/dL (ref 6.5–8.1)

## 2023-04-05 LAB — CBC WITH DIFFERENTIAL/PLATELET
Abs Immature Granulocytes: 0.02 10*3/uL (ref 0.00–0.07)
Basophils Absolute: 0.1 10*3/uL (ref 0.0–0.1)
Basophils Relative: 2 %
Eosinophils Absolute: 0.2 10*3/uL (ref 0.0–0.5)
Eosinophils Relative: 4 %
HCT: 39.2 % (ref 39.0–52.0)
Hemoglobin: 13.3 g/dL (ref 13.0–17.0)
Immature Granulocytes: 0 %
Lymphocytes Relative: 41 %
Lymphs Abs: 2.5 10*3/uL (ref 0.7–4.0)
MCH: 33.7 pg (ref 26.0–34.0)
MCHC: 33.9 g/dL (ref 30.0–36.0)
MCV: 99.2 fL (ref 80.0–100.0)
Monocytes Absolute: 0.6 10*3/uL (ref 0.1–1.0)
Monocytes Relative: 9 %
Neutro Abs: 2.6 10*3/uL (ref 1.7–7.7)
Neutrophils Relative %: 44 %
Platelets: 198 10*3/uL (ref 150–400)
RBC: 3.95 MIL/uL — ABNORMAL LOW (ref 4.22–5.81)
RDW: 14.1 % (ref 11.5–15.5)
WBC: 5.9 10*3/uL (ref 4.0–10.5)
nRBC: 0 % (ref 0.0–0.2)

## 2023-04-05 LAB — URINALYSIS, ROUTINE W REFLEX MICROSCOPIC
Bilirubin Urine: NEGATIVE
Glucose, UA: NEGATIVE mg/dL
Hgb urine dipstick: NEGATIVE
Ketones, ur: NEGATIVE mg/dL
Leukocytes,Ua: NEGATIVE
Nitrite: NEGATIVE
Protein, ur: NEGATIVE mg/dL
Specific Gravity, Urine: 1.021 (ref 1.005–1.030)
pH: 7 (ref 5.0–8.0)

## 2023-04-05 LAB — SURGICAL PCR SCREEN
MRSA, PCR: NEGATIVE
Staphylococcus aureus: NEGATIVE

## 2023-04-05 NOTE — Patient Instructions (Addendum)
Your procedure is scheduled on: Thursday, December 19 Report to the Registration Desk on the 1st floor of the [facility] To find out your arrival time, please call [phone] between 1PM - 3PM on: Wednesday, December 18 If your arrival time is 6:00 am, do not arrive before that time as the [facility] entrance doors do not open until 6:00 am.  REMEMBER: Instructions that are not followed completely may result in serious medical risk, up to and including death; or upon the discretion of your surgeon and anesthesiologist your surgery may need to be rescheduled.  Do not eat food after midnight the night before surgery.  No gum chewing or hard candies.  You may however, drink CLEAR liquids up to 2 hours before you are scheduled to arrive for your surgery. Do not drink anything within 2 hours of your scheduled arrival time.  Clear liquids include: - water  - apple juice without pulp - gatorade (not RED colors) - black coffee or tea (Do NOT add milk or creamers to the coffee or tea) Do NOT drink anything that is not on this list.  In addition, your doctor has ordered for you to drink the provided:  Ensure Pre-Surgery Clear Carbohydrate Drink  Drinking this carbohydrate drink up to two hours before surgery helps to reduce insulin resistance and improve patient outcomes. Please complete drinking 2 hours before scheduled arrival time.  One week prior to surgery: starting December 13 Stop Anti-inflammatories (NSAIDS) such as Advil, Aleve, Ibuprofen, Motrin, Naproxen, Naprosyn and Aspirin based products such as Excedrin, Goody's Powder, BC Powder. Stop ANY OVER THE COUNTER supplements until after surgery.  You may however, continue to take Tylenol if needed for pain up until the day of surgery.  Continue taking all of your other prescription medications up until the day of surgery.  ON THE DAY OF SURGERY DO NOT TAKE ANY MEDICATIONS   No Alcohol for 24 hours before or after  surgery.  No Smoking including e-cigarettes for 24 hours before surgery.  No chewable tobacco products for at least 6 hours before surgery.  No nicotine patches on the day of surgery.  Do not use any "recreational" drugs for at least a week (preferably 2 weeks) before your surgery.  Please be advised that the combination of cocaine and anesthesia may have negative outcomes, up to and including death. If you test positive for cocaine, your surgery will be cancelled.  On the morning of surgery brush your teeth with toothpaste and water, you may rinse your mouth with mouthwash if you wish. Do not swallow any toothpaste or mouthwash.  Use CHG Soap as directed on instruction sheet.  Do not wear jewelry, make-up, hairpins, clips or nail polish.  For welded (permanent) jewelry: bracelets, anklets, waist bands, etc.  Please have this removed prior to surgery.  If it is not removed, there is a chance that hospital personnel will need to cut it off on the day of surgery.  Do not wear lotions, powders, or perfumes.   Do not shave body hair from the neck down 48 hours before surgery.  Contact lenses, hearing aids and dentures may not be worn into surgery.  Do not bring valuables to the hospital. [facility] is not responsible for any missing/lost belongings or valuables.   Notify your doctor if there is any change in your medical condition (cold, fever, infection).  Wear comfortable clothing (specific to your surgery type) to the hospital.  After surgery, you can help prevent lung  complications by doing breathing exercises.  Take deep breaths and cough every 1-2 hours. Your doctor may order a device called an Incentive Spirometer to help you take deep breaths.  If you are being admitted to the hospital overnight, leave your suitcase in the car. After surgery it may be brought to your room.  In case of increased patient census, it may be necessary for you, the patient, to continue your  postoperative care in the Same Day Surgery department.  If you are being discharged the day of surgery, you will not be allowed to drive home. You will need a responsible individual to drive you home and stay with you for 24 hours after surgery.   If you are taking public transportation, you will need to have a responsible individual with you.  Please call the Pre-admissions Testing Dept. at [phone] if you have any questions about these instructions.  Surgery Visitation Policy:  Patients having surgery or a procedure may have two visitors.  Children under the age of 62 must have an adult with them who is not the patient.  Inpatient Visitation:    Visiting hours are 7 a.m. to 8 p.m. Up to four visitors are allowed at one time in a patient room. The visitors may rotate out with other people during the day.  One visitor age 61 or older may stay with the patient overnight and must be in the room by 8 p.m.    Pre-operative 5 CHG Bath Instructions   You can play a key role in reducing the risk of infection after surgery. Your skin needs to be as free of germs as possible. You can reduce the number of germs on your skin by washing with CHG (chlorhexidine gluconate) soap before surgery. CHG is an antiseptic soap that kills germs and continues to kill germs even after washing.   DO NOT use if you have an allergy to chlorhexidine/CHG or antibacterial soaps. If your skin becomes reddened or irritated, stop using the CHG and notify one of our RNs at [phone].   Please shower with the CHG soap starting 4 days before surgery using the following schedule:     Please keep in mind the following:  DO NOT shave, including legs and underarms, starting the day of your first shower.   You may shave your face at any point before/day of surgery.  Place clean sheets on your bed the day you start using CHG soap. Use a clean washcloth (not used since being washed) for each shower. DO NOT sleep  with pets once you start using the CHG.   CHG Shower Instructions:  If you choose to wash your hair and private area, wash first with your normal shampoo/soap.  After you use shampoo/soap, rinse your hair and body thoroughly to remove shampoo/soap residue.  Turn the water OFF and apply about 3 tablespoons (45 ml) of CHG soap to a CLEAN washcloth.  Apply CHG soap ONLY FROM YOUR NECK DOWN TO YOUR TOES (washing for 3-5 minutes)  DO NOT use CHG soap on face, private areas, open wounds, or sores.  Pay special attention to the area where your surgery is being performed.  If you are having back surgery, having someone wash your back for you may be helpful. Wait 2 minutes after CHG soap is applied, then you may rinse off the CHG soap.  Pat dry with a clean towel  Put on clean clothes/pajamas   If you choose to wear lotion, please use ONLY  the CHG-compatible lotions on the back of this paper.     Additional instructions for the day of surgery: DO NOT APPLY any lotions, deodorants, cologne, or perfumes.   Put on clean/comfortable clothes.  Brush your teeth.  Ask your nurse before applying any prescription medications to the skin.      CHG Compatible Lotions   Aveeno Moisturizing lotion  Cetaphil Moisturizing Cream  Cetaphil Moisturizing Lotion  Clairol Herbal Essence Moisturizing Lotion, Dry Skin  Clairol Herbal Essence Moisturizing Lotion, Extra Dry Skin  Clairol Herbal Essence Moisturizing Lotion, Normal Skin  Curel Age Defying Therapeutic Moisturizing Lotion with Alpha Hydroxy  Curel Extreme Care Body Lotion  Curel Soothing Hands Moisturizing Hand Lotion  Curel Therapeutic Moisturizing Cream, Fragrance-Free  Curel Therapeutic Moisturizing Lotion, Fragrance-Free  Curel Therapeutic Moisturizing Lotion, Original Formula  Eucerin Daily Replenishing Lotion  Eucerin Dry Skin Therapy Plus Alpha Hydroxy Crme  Eucerin Dry Skin Therapy Plus Alpha Hydroxy Lotion  Eucerin Original Crme   Eucerin Original Lotion  Eucerin Plus Crme Eucerin Plus Lotion  Eucerin TriLipid Replenishing Lotion  Keri Anti-Bacterial Hand Lotion  Keri Deep Conditioning Original Lotion Dry Skin Formula Softly Scented  Keri Deep Conditioning Original Lotion, Fragrance Free Sensitive Skin Formula  Keri Lotion Fast Absorbing Fragrance Free Sensitive Skin Formula  Keri Lotion Fast Absorbing Softly Scented Dry Skin Formula  Keri Original Lotion  Keri Skin Renewal Lotion Keri Silky Smooth Lotion  Keri Silky Smooth Sensitive Skin Lotion  Nivea Body Creamy Conditioning Oil  Nivea Body Extra Enriched Lotion  Nivea Body Original Lotion  Nivea Body Sheer Moisturizing Lotion Nivea Crme  Nivea Skin Firming Lotion  NutraDerm 30 Skin Lotion  NutraDerm Skin Lotion  NutraDerm Therapeutic Skin Cream  NutraDerm Therapeutic Skin Lotion  ProShield Protective Hand Cream  Provon moisturizing lotion     Preoperative Educational Videos for Total Hip, Knee and Shoulder Replacements  To better prepare for surgery, please view our videos that explain the physical activity and discharge planning required to have the best surgical recovery at [facility].  [email or web address]  Questions? Call [phone] or email [email or web address]

## 2023-04-10 LAB — TYPE AND SCREEN
ABO/RH(D): O NEG
Antibody Screen: NEGATIVE

## 2023-04-10 MED ORDER — ORAL CARE MOUTH RINSE: 15.0000 mL | Freq: Once | OROMUCOSAL | Status: AC

## 2023-04-10 MED ORDER — VANCOMYCIN HCL IN DEXTROSE 1-5 GM/200ML-% IV SOLN
1000.0000 mg | INTRAVENOUS | Status: AC
Start: 2023-04-11 — End: 2023-04-12
  Administered 2023-04-11: 1000 mg via INTRAVENOUS

## 2023-04-10 MED ORDER — LACTATED RINGERS IV SOLN: INTRAVENOUS | Status: DC

## 2023-04-10 MED ORDER — TRANEXAMIC ACID-NACL 1000-0.7 MG/100ML-% IV SOLN
1000.0000 mg | INTRAVENOUS | Status: AC
  Administered 2023-04-11 (×2): 1000 mg via INTRAVENOUS

## 2023-04-10 MED ORDER — GENTAMICIN SULFATE 40 MG/ML IJ SOLN
5.0000 mg/kg | INTRAVENOUS | Status: AC
  Administered 2023-04-11: 520 mg via INTRAVENOUS
  Filled 2023-04-10: qty 13

## 2023-04-10 MED ORDER — CHLORHEXIDINE GLUCONATE 0.12 % MT SOLN
15.0000 mL | Freq: Once | OROMUCOSAL | Status: AC
  Administered 2023-04-11: 15 mL via OROMUCOSAL

## 2023-04-11 ENCOUNTER — Encounter: Payer: Self-pay | Admitting: Surgery

## 2023-04-11 ENCOUNTER — Encounter
Admission: RE | Disposition: A | Discharge status: Home / Self Care | Payer: Self-pay | Source: Home / Self Care | Attending: Surgery

## 2023-04-11 ENCOUNTER — Ambulatory Visit
Admission: RE | Admit: 2023-04-11 | Discharge: 2023-04-12 | Disposition: A | Discharge status: Home / Self Care | Payer: 59 | Attending: Surgery | Admitting: Surgery

## 2023-04-11 ENCOUNTER — Ambulatory Visit: Payer: 59 | Admitting: General Practice

## 2023-04-11 ENCOUNTER — Ambulatory Visit: Payer: 59

## 2023-04-11 ENCOUNTER — Other Ambulatory Visit: Payer: Self-pay

## 2023-04-11 ENCOUNTER — Ambulatory Visit: Payer: 59 | Admitting: Urgent Care

## 2023-04-11 DIAGNOSIS — R030 Elevated blood-pressure reading, without diagnosis of hypertension: Secondary | ICD-10-CM | POA: Insufficient documentation

## 2023-04-11 DIAGNOSIS — M1611 Unilateral primary osteoarthritis, right hip: Secondary | ICD-10-CM | POA: Diagnosis present

## 2023-04-11 DIAGNOSIS — G8929 Other chronic pain: Secondary | ICD-10-CM | POA: Diagnosis not present

## 2023-04-11 DIAGNOSIS — Z6831 Body mass index (BMI) 31.0-31.9, adult: Secondary | ICD-10-CM | POA: Insufficient documentation

## 2023-04-11 DIAGNOSIS — Z96641 Presence of right artificial hip joint: Secondary | ICD-10-CM

## 2023-04-11 DIAGNOSIS — Z720 Tobacco use: Secondary | ICD-10-CM | POA: Insufficient documentation

## 2023-04-11 DIAGNOSIS — I10 Essential (primary) hypertension: Secondary | ICD-10-CM | POA: Insufficient documentation

## 2023-04-11 DIAGNOSIS — Z01812 Encounter for preprocedural laboratory examination: Secondary | ICD-10-CM

## 2023-04-11 DIAGNOSIS — Z79899 Other long term (current) drug therapy: Secondary | ICD-10-CM | POA: Insufficient documentation

## 2023-04-11 DIAGNOSIS — Z7901 Long term (current) use of anticoagulants: Secondary | ICD-10-CM | POA: Diagnosis not present

## 2023-04-11 DIAGNOSIS — F319 Bipolar disorder, unspecified: Secondary | ICD-10-CM | POA: Insufficient documentation

## 2023-04-11 DIAGNOSIS — E6689 Other obesity not elsewhere classified: Secondary | ICD-10-CM | POA: Insufficient documentation

## 2023-04-11 HISTORY — PX: TOTAL HIP ARTHROPLASTY: SHX124

## 2023-04-11 SURGERY — ARTHROPLASTY, HIP, TOTAL,POSTERIOR APPROACH
Anesthesia: General | Site: Hip | Laterality: Right

## 2023-04-11 MED ORDER — VANCOMYCIN HCL IN DEXTROSE 1-5 GM/200ML-% IV SOLN
INTRAVENOUS | Status: AC
  Filled 2023-04-11: qty 200

## 2023-04-11 MED ORDER — ACETAMINOPHEN 10 MG/ML IV SOLN
INTRAVENOUS | Status: DC | PRN
  Administered 2023-04-11: 1000 mg via INTRAVENOUS

## 2023-04-11 MED ORDER — PHENYLEPHRINE 80 MCG/ML (10ML) SYRINGE FOR IV PUSH (FOR BLOOD PRESSURE SUPPORT)
PREFILLED_SYRINGE | INTRAVENOUS | Status: AC
  Filled 2023-04-11: qty 10

## 2023-04-11 MED ORDER — DIPHENHYDRAMINE HCL 50 MG/ML IJ SOLN
25.0000 mg | Freq: Once | INTRAMUSCULAR | Status: AC
Start: 2023-04-11 — End: 2023-04-11
  Administered 2023-04-11: 25 mg via INTRAVENOUS

## 2023-04-11 MED ORDER — SODIUM CHLORIDE 0.9% FLUSH
INTRAVENOUS | Status: DC | PRN
  Administered 2023-04-11: 40 mL

## 2023-04-11 MED ORDER — ROCURONIUM BROMIDE 100 MG/10ML IV SOLN
INTRAVENOUS | Status: DC | PRN
  Administered 2023-04-11: 10 mg via INTRAVENOUS
  Administered 2023-04-11: 5 mg via INTRAVENOUS
  Administered 2023-04-11: 70 mg via INTRAVENOUS

## 2023-04-11 MED ORDER — MAGNESIUM HYDROXIDE 400 MG/5ML PO SUSP: 30.0000 mL | Freq: Every day | ORAL | Status: DC | PRN

## 2023-04-11 MED ORDER — SUGAMMADEX SODIUM 200 MG/2ML IV SOLN
INTRAVENOUS | Status: DC | PRN
  Administered 2023-04-11: 200 mg via INTRAVENOUS

## 2023-04-11 MED ORDER — OXYCODONE HCL 5 MG PO TABS
ORAL_TABLET | ORAL | Status: AC
  Filled 2023-04-11: qty 2

## 2023-04-11 MED ORDER — FLEET ENEMA RE ENEM: 1.0000 | ENEMA | Freq: Once | RECTAL | Status: DC | PRN

## 2023-04-11 MED ORDER — OXYCODONE HCL 5 MG PO TABS: 5.0000 mg | ORAL_TABLET | Freq: Once | ORAL | Status: DC | PRN

## 2023-04-11 MED ORDER — DEXMEDETOMIDINE HCL IN NACL 80 MCG/20ML IV SOLN
INTRAVENOUS | Status: AC
  Filled 2023-04-11: qty 20

## 2023-04-11 MED ORDER — KETOROLAC TROMETHAMINE 30 MG/ML IJ SOLN
INTRAMUSCULAR | Status: AC
  Filled 2023-04-11: qty 1

## 2023-04-11 MED ORDER — ACETAMINOPHEN 500 MG PO TABS
ORAL_TABLET | ORAL | Status: AC
  Filled 2023-04-11: qty 2

## 2023-04-11 MED ORDER — TRANEXAMIC ACID-NACL 1000-0.7 MG/100ML-% IV SOLN
INTRAVENOUS | Status: AC
  Filled 2023-04-11: qty 100

## 2023-04-11 MED ORDER — BUPIVACAINE-EPINEPHRINE (PF) 0.5% -1:200000 IJ SOLN
INTRAMUSCULAR | Status: AC
  Filled 2023-04-11: qty 30

## 2023-04-11 MED ORDER — DEXMEDETOMIDINE HCL IN NACL 80 MCG/20ML IV SOLN
INTRAVENOUS | Status: DC | PRN
  Administered 2023-04-11 (×2): 10 ug via INTRAVENOUS

## 2023-04-11 MED ORDER — FENTANYL CITRATE (PF) 100 MCG/2ML IJ SOLN
INTRAMUSCULAR | Status: AC
  Filled 2023-04-11: qty 2

## 2023-04-11 MED ORDER — OXYCODONE HCL 5 MG PO TABS
5.0000 mg | ORAL_TABLET | ORAL | Status: DC | PRN
  Administered 2023-04-11 – 2023-04-12 (×5): 10 mg via ORAL
  Filled 2023-04-11 (×2): qty 2
  Filled 2023-04-11 (×2): qty 1

## 2023-04-11 MED ORDER — PHENYLEPHRINE HCL-NACL 20-0.9 MG/250ML-% IV SOLN
INTRAVENOUS | Status: AC
  Filled 2023-04-11: qty 250

## 2023-04-11 MED ORDER — PROPOFOL 10 MG/ML IV BOLUS
INTRAVENOUS | Status: AC
  Filled 2023-04-11: qty 20

## 2023-04-11 MED ORDER — SODIUM CHLORIDE (PF) 0.9 % IJ SOLN
INTRAMUSCULAR | Status: AC
  Filled 2023-04-11: qty 50

## 2023-04-11 MED ORDER — BUPIVACAINE LIPOSOME 1.3 % IJ SUSP
INTRAMUSCULAR | Status: AC
  Filled 2023-04-11: qty 20

## 2023-04-11 MED ORDER — ONDANSETRON HCL 4 MG/2ML IJ SOLN
INTRAMUSCULAR | Status: AC
  Filled 2023-04-11: qty 2

## 2023-04-11 MED ORDER — ONDANSETRON HCL 4 MG/2ML IJ SOLN: 4.0000 mg | Freq: Four times a day (QID) | INTRAMUSCULAR | Status: DC | PRN

## 2023-04-11 MED ORDER — TRIAMCINOLONE ACETONIDE 40 MG/ML IJ SUSP
INTRAMUSCULAR | Status: AC
  Filled 2023-04-11: qty 1

## 2023-04-11 MED ORDER — FENTANYL CITRATE (PF) 100 MCG/2ML IJ SOLN
INTRAMUSCULAR | Status: DC | PRN
  Administered 2023-04-11: 100 ug via INTRAVENOUS
  Administered 2023-04-11 (×2): 50 ug via INTRAVENOUS

## 2023-04-11 MED ORDER — METOCLOPRAMIDE HCL 5 MG/ML IJ SOLN: 5.0000 mg | Freq: Three times a day (TID) | INTRAMUSCULAR | Status: DC | PRN

## 2023-04-11 MED ORDER — DEXAMETHASONE SODIUM PHOSPHATE 10 MG/ML IJ SOLN
INTRAMUSCULAR | Status: AC
  Filled 2023-04-11: qty 1

## 2023-04-11 MED ORDER — CHLORHEXIDINE GLUCONATE 0.12 % MT SOLN
OROMUCOSAL | Status: AC
  Filled 2023-04-11: qty 15

## 2023-04-11 MED ORDER — DIPHENHYDRAMINE HCL 12.5 MG/5ML PO ELIX
12.5000 mg | ORAL_SOLUTION | ORAL | Status: DC | PRN
  Administered 2023-04-11: 25 mg via ORAL
  Administered 2023-04-12 (×3): 12.5 mg via ORAL
  Filled 2023-04-11 (×2): qty 10
  Filled 2023-04-11: qty 5

## 2023-04-11 MED ORDER — DEXTROSE-SODIUM CHLORIDE 5-0.9 % IV SOLN: INTRAVENOUS | Status: DC

## 2023-04-11 MED ORDER — PHENYLEPHRINE HCL-NACL 20-0.9 MG/250ML-% IV SOLN
INTRAVENOUS | Status: DC | PRN
  Administered 2023-04-11: 20 ug/min via INTRAVENOUS

## 2023-04-11 MED ORDER — PHENYLEPHRINE HCL (PRESSORS) 10 MG/ML IV SOLN
INTRAVENOUS | Status: AC
  Filled 2023-04-11: qty 1

## 2023-04-11 MED ORDER — EPHEDRINE 5 MG/ML INJ
INTRAVENOUS | Status: AC
  Filled 2023-04-11: qty 5

## 2023-04-11 MED ORDER — ONDANSETRON HCL 4 MG/2ML IJ SOLN
INTRAMUSCULAR | Status: DC | PRN
  Administered 2023-04-11: 4 mg via INTRAVENOUS

## 2023-04-11 MED ORDER — KETOROLAC TROMETHAMINE 15 MG/ML IJ SOLN
15.0000 mg | Freq: Four times a day (QID) | INTRAMUSCULAR | Status: AC
  Administered 2023-04-11 – 2023-04-12 (×4): 15 mg via INTRAVENOUS
  Filled 2023-04-11 (×3): qty 1

## 2023-04-11 MED ORDER — TRIAMCINOLONE ACETONIDE 40 MG/ML IJ SUSP
INTRAMUSCULAR | Status: DC | PRN
  Administered 2023-04-11: 80 mg

## 2023-04-11 MED ORDER — ONDANSETRON HCL 4 MG PO TABS: 4.0000 mg | ORAL_TABLET | Freq: Four times a day (QID) | ORAL | Status: DC | PRN

## 2023-04-11 MED ORDER — KETOROLAC TROMETHAMINE 30 MG/ML IJ SOLN
30.0000 mg | Freq: Once | INTRAMUSCULAR | Status: AC
  Administered 2023-04-11: 30 mg via INTRAVENOUS

## 2023-04-11 MED ORDER — PHENYLEPHRINE 80 MCG/ML (10ML) SYRINGE FOR IV PUSH (FOR BLOOD PRESSURE SUPPORT)
PREFILLED_SYRINGE | INTRAVENOUS | Status: DC | PRN
  Administered 2023-04-11: 120 ug via INTRAVENOUS
  Administered 2023-04-11 (×2): 80 ug via INTRAVENOUS

## 2023-04-11 MED ORDER — HYDROMORPHONE HCL 1 MG/ML IJ SOLN: 0.5000 mg | INTRAMUSCULAR | Status: DC | PRN

## 2023-04-11 MED ORDER — BUPIVACAINE-EPINEPHRINE (PF) 0.5% -1:200000 IJ SOLN
INTRAMUSCULAR | Status: DC | PRN
  Administered 2023-04-11: 30 mL

## 2023-04-11 MED ORDER — KETOROLAC TROMETHAMINE 30 MG/ML IJ SOLN
INTRAMUSCULAR | Status: DC | PRN
  Administered 2023-04-11: 30 mg via INTRAMUSCULAR

## 2023-04-11 MED ORDER — DIPHENHYDRAMINE HCL 50 MG/ML IJ SOLN
INTRAMUSCULAR | Status: AC
  Filled 2023-04-11: qty 1

## 2023-04-11 MED ORDER — ACETAMINOPHEN 500 MG PO TABS
1000.0000 mg | ORAL_TABLET | Freq: Four times a day (QID) | ORAL | Status: AC
  Administered 2023-04-11 – 2023-04-12 (×4): 1000 mg via ORAL
  Filled 2023-04-11 (×2): qty 2

## 2023-04-11 MED ORDER — ROCURONIUM BROMIDE 10 MG/ML (PF) SYRINGE
PREFILLED_SYRINGE | INTRAVENOUS | Status: AC
  Filled 2023-04-11: qty 10

## 2023-04-11 MED ORDER — ACETAMINOPHEN 10 MG/ML IV SOLN
INTRAVENOUS | Status: AC
  Filled 2023-04-11: qty 100

## 2023-04-11 MED ORDER — OXYCODONE HCL 5 MG/5ML PO SOLN: 5.0000 mg | Freq: Once | ORAL | Status: DC | PRN

## 2023-04-11 MED ORDER — MIDAZOLAM HCL 2 MG/2ML IJ SOLN
INTRAMUSCULAR | Status: DC | PRN
  Administered 2023-04-11: 2 mg via INTRAVENOUS

## 2023-04-11 MED ORDER — HYDROMORPHONE HCL 1 MG/ML IJ SOLN
0.5000 mg | INTRAMUSCULAR | Status: DC | PRN
  Administered 2023-04-11 (×2): 0.5 mg via INTRAVENOUS

## 2023-04-11 MED ORDER — KETOROLAC TROMETHAMINE 15 MG/ML IJ SOLN
INTRAMUSCULAR | Status: AC
  Filled 2023-04-11: qty 1

## 2023-04-11 MED ORDER — METOCLOPRAMIDE HCL 10 MG PO TABS: 5.0000 mg | ORAL_TABLET | Freq: Three times a day (TID) | ORAL | Status: DC | PRN

## 2023-04-11 MED ORDER — PROPOFOL 10 MG/ML IV BOLUS
INTRAVENOUS | Status: DC | PRN
  Administered 2023-04-11: 200 mg via INTRAVENOUS

## 2023-04-11 MED ORDER — DIPHENHYDRAMINE HCL 12.5 MG/5ML PO ELIX
ORAL_SOLUTION | ORAL | Status: AC
  Filled 2023-04-11: qty 10

## 2023-04-11 MED ORDER — LIDOCAINE HCL (CARDIAC) PF 100 MG/5ML IV SOSY
PREFILLED_SYRINGE | INTRAVENOUS | Status: DC | PRN
  Administered 2023-04-11: 100 mg via INTRAVENOUS

## 2023-04-11 MED ORDER — SEVOFLURANE IN SOLN
RESPIRATORY_TRACT | Status: AC
  Filled 2023-04-11: qty 250

## 2023-04-11 MED ORDER — APIXABAN 2.5 MG PO TABS
2.5000 mg | ORAL_TABLET | Freq: Two times a day (BID) | ORAL | Status: DC
  Administered 2023-04-12: 2.5 mg via ORAL
  Filled 2023-04-11: qty 1

## 2023-04-11 MED ORDER — LIDOCAINE HCL (PF) 2 % IJ SOLN
INTRAMUSCULAR | Status: AC
  Filled 2023-04-11: qty 5

## 2023-04-11 MED ORDER — VANCOMYCIN HCL IN DEXTROSE 1-5 GM/200ML-% IV SOLN
1000.0000 mg | Freq: Two times a day (BID) | INTRAVENOUS | Status: AC
  Administered 2023-04-11: 1000 mg via INTRAVENOUS

## 2023-04-11 MED ORDER — MIDAZOLAM HCL 2 MG/2ML IJ SOLN
INTRAMUSCULAR | Status: AC
Start: 2023-04-11 — End: ?
  Filled 2023-04-11: qty 2

## 2023-04-11 MED ORDER — BISACODYL 10 MG RE SUPP: 10.0000 mg | Freq: Every day | RECTAL | Status: DC | PRN

## 2023-04-11 MED ORDER — ACETAMINOPHEN 325 MG PO TABS: 325.0000 mg | ORAL_TABLET | Freq: Four times a day (QID) | ORAL | Status: DC | PRN

## 2023-04-11 MED ORDER — FENTANYL CITRATE (PF) 100 MCG/2ML IJ SOLN
25.0000 ug | INTRAMUSCULAR | Status: DC | PRN
  Administered 2023-04-11 (×2): 50 ug via INTRAVENOUS

## 2023-04-11 MED ORDER — DEXAMETHASONE SODIUM PHOSPHATE 10 MG/ML IJ SOLN
INTRAMUSCULAR | Status: DC | PRN
  Administered 2023-04-11: 10 mg via INTRAVENOUS

## 2023-04-11 MED ORDER — HYDROMORPHONE HCL 1 MG/ML IJ SOLN
INTRAMUSCULAR | Status: AC
  Filled 2023-04-11: qty 1

## 2023-04-11 MED ORDER — DOCUSATE SODIUM 100 MG PO CAPS
100.0000 mg | ORAL_CAPSULE | Freq: Two times a day (BID) | ORAL | Status: DC
  Administered 2023-04-12: 100 mg via ORAL
  Filled 2023-04-11: qty 1

## 2023-04-11 SURGICAL SUPPLY — 54 items
ADAPTER BIOLOX TAPER OPT +6 (Miscellaneous) IMPLANT
BASIN GRAD PLASTIC 32OZ STRL (MISCELLANEOUS) IMPLANT
BIT DRILL 2.5 CANN STRL (BIT) IMPLANT
BLADE SAGITTAL WIDE XTHICK NO (BLADE) ×1 IMPLANT
BLADE SURG SZ20 CARB STEEL (BLADE) ×1 IMPLANT
CHLORAPREP W/TINT 26 (MISCELLANEOUS) ×1 IMPLANT
DRAPE IMP U-DRAPE 54X76 (DRAPES) IMPLANT
DRAPE INCISE IOBAN 66X60 STRL (DRAPES) ×1 IMPLANT
DRAPE SHEET LG 3/4 BI-LAMINATE (DRAPES) ×1 IMPLANT
DRAPE SURG 17X11 SM STRL (DRAPES) ×2 IMPLANT
DRSG MEPILEX SACRM 8.7X9.8 (GAUZE/BANDAGES/DRESSINGS) ×1 IMPLANT
DRSG OPSITE POSTOP 4X10 (GAUZE/BANDAGES/DRESSINGS) ×1 IMPLANT
ELECT CAUTERY BLADE 6.4 (BLADE) ×1 IMPLANT
GAUZE 4X4 16PLY ~~LOC~~+RFID DBL (SPONGE) ×1 IMPLANT
GAUZE XEROFORM 1X8 LF (GAUZE/BANDAGES/DRESSINGS) ×1 IMPLANT
GLOVE BIO SURGEON STRL SZ7.5 (GLOVE) ×4 IMPLANT
GLOVE BIO SURGEON STRL SZ8 (GLOVE) ×4 IMPLANT
GLOVE BIOGEL PI IND STRL 8 (GLOVE) ×1 IMPLANT
GLOVE INDICATOR 8.0 STRL GRN (GLOVE) ×1 IMPLANT
GOWN STRL REUS W/ TWL LRG LVL3 (GOWN DISPOSABLE) ×1 IMPLANT
GOWN STRL REUS W/ TWL XL LVL3 (GOWN DISPOSABLE) ×1 IMPLANT
HANDLE YANKAUER SUCT OPEN TIP (MISCELLANEOUS) ×1 IMPLANT
HEAD CERAMIC BIOLOX 36MM (Head) IMPLANT
HOLSTER ELECTROSUGICAL PENCIL (MISCELLANEOUS) ×1 IMPLANT
HOOD PEEL AWAY T7 (MISCELLANEOUS) ×3 IMPLANT
IV NS IRRIG 3000ML ARTHROMATIC (IV SOLUTION) ×1 IMPLANT
KIT TURNOVER KIT A (KITS) ×1 IMPLANT
LINER ACE G7 36 SZF HIGH WALL (Liner) IMPLANT
MANIFOLD NEPTUNE II (INSTRUMENTS) ×1 IMPLANT
NDL FILTER BLUNT 18X1 1/2 (NEEDLE) ×1 IMPLANT
NDL SAFETY ECLIPSE 18X1.5 (NEEDLE) ×2 IMPLANT
NDL SPNL 20GX3.5 QUINCKE YW (NEEDLE) ×1 IMPLANT
NEEDLE FILTER BLUNT 18X1 1/2 (NEEDLE) ×1 IMPLANT
NEEDLE SPNL 20GX3.5 QUINCKE YW (NEEDLE) ×1 IMPLANT
PACK HIP PROSTHESIS (MISCELLANEOUS) ×1 IMPLANT
PENCIL SMOKE EVACUATOR (MISCELLANEOUS) ×1 IMPLANT
PIN STEINMANN 3/16X9 BAY 6PK (Pin) ×2 IMPLANT
PULSAVAC PLUS IRRIG FAN TIP (DISPOSABLE) ×1
SHELL ACETABULAR 3H 54MM F (Shell) IMPLANT
SPONGE T-LAP 18X18 ~~LOC~~+RFID (SPONGE) ×5 IMPLANT
ST PIN 3/16X9 BAY 6PK (Pin) ×2 IMPLANT
STAPLER SKIN PROX 35W (STAPLE) ×1 IMPLANT
STEM COLLARLESS FULL 13X145MM (Stem) IMPLANT
SUT TICRON 2-0 30IN 311381 (SUTURE) ×3 IMPLANT
SUT VIC AB 0 CT1 36 (SUTURE) ×1 IMPLANT
SUT VIC AB 1 CT1 36 (SUTURE) ×1 IMPLANT
SUT VIC AB 2-0 CT1 (SUTURE) ×3 IMPLANT
SYR 10ML LL (SYRINGE) ×1 IMPLANT
SYR 20ML LL LF (SYRINGE) ×1 IMPLANT
SYR 30ML LL (SYRINGE) ×2 IMPLANT
TIP FAN IRRIG PULSAVAC PLUS (DISPOSABLE) ×1 IMPLANT
TRAP FLUID SMOKE EVACUATOR (MISCELLANEOUS) ×2 IMPLANT
WATER STERILE IRR 1000ML POUR (IV SOLUTION) ×1 IMPLANT
WATER STERILE IRR 500ML POUR (IV SOLUTION) ×1 IMPLANT

## 2023-04-11 NOTE — Plan of Care (Signed)
  Problem: Education: Goal: Knowledge of General Education information will improve Description: Including pain rating scale, medication(s)/side effects and non-pharmacologic comfort measures Outcome: Progressing   Problem: Health Behavior/Discharge Planning: Goal: Ability to manage health-related needs will improve Outcome: Progressing   Problem: Clinical Measurements: Goal: Ability to maintain clinical measurements within normal limits will improve Outcome: Progressing Goal: Will remain free from infection Outcome: Progressing Goal: Diagnostic test results will improve Outcome: Progressing Goal: Respiratory complications will improve Outcome: Progressing Goal: Cardiovascular complication will be avoided Outcome: Progressing   Problem: Activity: Goal: Risk for activity intolerance will decrease Outcome: Progressing   Problem: Nutrition: Goal: Adequate nutrition will be maintained Outcome: Progressing   Problem: Coping: Goal: Level of anxiety will decrease Outcome: Progressing   Problem: Elimination: Goal: Will not experience complications related to bowel motility Outcome: Progressing Goal: Will not experience complications related to urinary retention Outcome: Progressing   Problem: Pain Management: Goal: General experience of comfort will improve Outcome: Progressing   Problem: Safety: Goal: Ability to remain free from injury will improve Outcome: Progressing   Problem: Skin Integrity: Goal: Risk for impaired skin integrity will decrease Outcome: Progressing   Problem: Education: Goal: Knowledge of the prescribed therapeutic regimen will improve Outcome: Progressing   Problem: Activity: Goal: Ability to avoid complications of mobility impairment will improve Outcome: Progressing Goal: Ability to tolerate increased activity will improve Outcome: Progressing   Problem: Clinical Measurements: Goal: Postoperative complications will be avoided or  minimized Outcome: Progressing   Problem: Pain Management: Goal: Pain level will decrease with appropriate interventions Outcome: Progressing

## 2023-04-11 NOTE — Progress Notes (Signed)
Updated pt's family via text message.  Still waiting on bed placement for pt.  Pt VSS. Pain tolerated.  Having lunch.

## 2023-04-11 NOTE — Anesthesia Postprocedure Evaluation (Signed)
Anesthesia Post Note  Patient: Vernon Allen  Procedure(s) Performed: TOTAL HIP ARTHROPLASTY (Right: Hip)  Patient location during evaluation: PACU Anesthesia Type: General Level of consciousness: awake and alert Pain management: pain level controlled Vital Signs Assessment: post-procedure vital signs reviewed and stable Respiratory status: spontaneous breathing, nonlabored ventilation, respiratory function stable and patient connected to nasal cannula oxygen Cardiovascular status: blood pressure returned to baseline and stable Postop Assessment: no apparent nausea or vomiting Anesthetic complications: no  No notable events documented.   Last Vitals:  Vitals:   04/11/23 1452 04/11/23 1500  BP:  122/75  Pulse: 81 78  Resp: 13 11  Temp:    SpO2: 98% 97%    Last Pain:  Vitals:   04/11/23 1410  TempSrc:   PainSc: 3                  Stephanie Coup

## 2023-04-11 NOTE — Op Note (Signed)
04/11/2023  12:54 PM  Patient:   Vernon Allen  Pre-Op Diagnosis:   Degenerative joint disease, right hip.  Post-Op Diagnosis:   Same.  Procedure:   Right total hip arthroplasty.  Surgeon:   Maryagnes Amos, MD  Assistant:   Horris Latino, PA-C  Anesthesia:   GET  Findings:   As above.  Complications:   None  EBL:   100 cc  Fluids:   700 cc crystalloid  UOP:   None  TT:   None  Drains:   None  Closure:   Staples  Implants:   Biomet press-fit system with a #13 laterally offset Echo femoral stem, a 54 mm acetabular shell with an E-poly hi-wall liner, and a 36 mm ceramic head with a +6 mm neck.  Brief Clinical Note:   The patient is a 42 year old male with a history of progressively worsening right hip/groin pain. His symptoms have progressed despite medications, activity modification, etc. His history and examination were suspicious for avascular necrosis of the femoral head as he had a history of alcohol abuse but relatively unremarkable x-rays. His preoperative MRI scan confirmed the presence of significant avascular necrosis involving the weightbearing portion of the femoral head. The patient presents at this time for a right total hip arthroplasty.  Procedure:   The patient was brought into the operating room and laid in the supine position. After adequate general endotracheal intubation and anesthesia was obtained, the patient was repositioned in the left lateral decubitus position and secured using a lateral hip positioner. The right hip and lower extremity were prepped with ChloroPrep solution before being draped sterilely. Preoperative antibiotics were administered. A timeout was performed to verify the appropriate surgical site.    A standard posterior approach to the hip was made through an approximately 4-5 inch incision. The incision was carried down through the subcutaneous tissues to expose the gluteal fascia and proximal end of the iliotibial band. These  structures were split the length of the incision and the Charnley self-retaining hip retractor placed. The bursal tissues were swept posteriorly to expose the short external rotators. The anterior border of the piriformis tendon was identified and this plane developed down through the capsule to enter the joint. A flap of tissue was elevated off the posterior aspect of the femoral neck and greater trochanter and retracted posteriorly. This flap included the piriformis tendon, the short external rotators, and the posterior capsule. The soft tissues were elevated off the lateral aspect of the ilium and a large Steinmann pin placed bicortically.   With the right leg aligned over the left, a drill bit was placed into the greater trochanter parallel to the Steinmann pin and the distance between these two pins measured in order to optimize leg lengths postoperatively. The drill bit was removed and the hip dislocated. The piriformis fossa was debrided of soft tissues before the intramedullary canal was accessed through this point using a triple step reamer. The canal was reamed sequentially beginning with a #7 tapered reamer and progressing to a #13 tapered reamer. This provided excellent circumferential chatter. Using the appropriate guide, a femoral neck cut was made 10-12 mm above the lesser trochanter. The femoral head was removed.  Attention was directed to the acetabular side. The labrum was debrided circumferentially before the ligamentum teres was removed using a large curette. A line was drawn on the drapes corresponding to the native version of the acetabulum. This line was used as a guide while the acetabulum was reamed  sequentially beginning with a 47 mm reamer and progressing to a 53 mm reamer. This provided excellent circumferential chatter. The 53 mm trial acetabulum was positioned and found to fit quite well. Therefore, the 54 mm acetabular shell was selected and impacted into place with care taken to  maintain the appropriate version. The trial high wall liner was inserted.  Attention was redirected to the femoral side. A box osteotome was used to establish version before the canal was broached sequentially beginning with a #7 broach and progressing to a #13 broach. This was left in place and several trial reductions performed using both the standard and laterally offset neck options, as well as the -6 mm, +0 mm, +3 mm, and +6 mm neck lengths. After removing the trial components, the "manhole cover" was placed into the apex of the acetabular shell and tightened securely. The permanent E-polyethylene hi-wall liner was impacted into the acetabular shell and its locking mechanism verified using a quarter-inch osteotome. Next, the #13 laterally offset femoral stem was impacted into place with care taken to maintain the appropriate version. A repeat trial reduction was performed using the +3 mm and +6 mm neck lengths. The +6 mm neck length demonstrated excellent stability both in extension and external rotation as well as with flexion to 90 and internal rotation beyond 70. It also was stable in the position of sleep. In addition, leg lengths appeared to be restored appropriately, both by reassessing the position of the right leg over the left, as well as by measuring the distance between the Steinmann pin and the drill bit. The 36 mm ceramic head with the +6 mm neck adapter was impacted onto the stem of the femoral component. The Morse taper locking mechanism was verified using manual distraction before the head was relocated and placed through a range of motion with the findings as described above.  The wound was copiously irrigated with sterile saline solution via the jet lavage system before the peri-incisional and pericapsular tissues were injected with a "cocktail" of 30 cc of 0.5% Sensorcaine, 2 cc of Kenalog 40 (80 mg), and 30 mg of Toradol diluted out to 70 cc with normal saline to help with  postoperative analgesia. The posterior flap was reapproximated to the posterior aspect of the greater trochanter using #2 Tycron interrupted sutures placed through drill holes. Several additional #2 Tycron interrupted sutures were used to reinforce this layer of closure. The iliotibial band was reapproximated using #1 Vicryl interrupted sutures before the gluteal fascia was closed using a running #1 Vicryl suture. At this point, 1 g of transexemic acid in 10 cc of normal saline was injected into the joint to help reduce postoperative bleeding. The subcutaneous tissues were closed in several layers using 2-0 Vicryl interrupted sutures before the skin was closed using staples. A sterile occlusive dressing was applied to the wound. The patient was then rolled back into the supine position on his hospital bed before being awakened, extubated, and returned to the recovery room in satisfactory condition after tolerating the procedure well.

## 2023-04-11 NOTE — Anesthesia Procedure Notes (Signed)
Procedure Name: Intubation Date/Time: 04/11/2023 10:46 AM  Performed by: Janeli Lewison, [place], CRNAPre-anesthesia Checklist: Patient identified, Patient being monitored, Timeout performed, Emergency Drugs available and Suction available Patient Re-evaluated:Patient Re-evaluated prior to induction Oxygen Delivery Method: Circle system utilized Preoxygenation: Pre-oxygenation with 100% oxygen Induction Type: IV induction Ventilation: Mask ventilation without difficulty Laryngoscope Size: McGrath and 4 Grade View: Grade I Tube type: Oral Tube size: 7.5 mm Number of attempts: 1 Airway Equipment and Method: Stylet Placement Confirmation: ETT inserted through vocal cords under direct vision, positive ETCO2 and breath sounds checked- equal and bilateral Secured at: 23 cm Tube secured with: Tape Dental Injury: Teeth and Oropharynx as per pre-operative assessment

## 2023-04-11 NOTE — H&P (Signed)
History of Present Illness: Vernon Allen is a 42 y.o. male who presents today for evaluation of ongoing right hip pain. The patient has been experiencing pain across the lower aspect of his lumbar spine, right buttock and radiation into the right hip over the past year however reports that it has been worsening more so in the last 5 months. 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 The patient denies any surgical history to his right hip, but does have a history of a L4-5 decompression. The patient denies any significant use of steroids in the past. The patient does report a history of alcohol abuse, he states that he used to drink liquor on a daily occurrence.  More recently, he has been trying to decrease his alcohol intake but he still drinks several beers per night. The patient was referred to physiatry for evaluation of his ongoing low back pain however at physiatry he was reporting more right hip and groin discomfort, because of this the patient did undergo x-rays of both the lumbar spine and right hip. X-rays of the right hip demonstrated significant sclerotic changes indicative of underlying avascular necrosis. The patient reports that he is having difficulty with walking and standing for prolonged period time. Pain is an aching and throbbing discomfort he states that the pain is beginning to wake him up at this time as well. Pain score in the right hip is a 7 out of 10 at today's visit. The patient does smoke tobacco. He denies any personal history of heart attack, stroke, asthma or COPD. Denies any history of blood clots. He is not diabetic.  Past Medical History: Anxiety  Arthritis  Chronic mental illness  Substance abuse (CMS/HHS-HCC)   Past Surgical History: Past Surgical History:  APPENDECTOMY  COLON SURGERY   Past Family History: Adopted: Yes  Family history unknown   Medications: acetaminophen  (TYLENOL) 500 MG tablet Take 1,000 mg by mouth once daily as needed  albuterol MDI, PROVENTIL, VENTOLIN, PROAIR, HFA 90 mcg/actuation inhaler Inhale 2 Inhalations into the lungs every 6 (six) hours as needed (Patient not taking: Reported on 02/26/2023)  traMADoL (ULTRAM) 50 mg tablet Take 1 tablet (50 mg total) by mouth 2 (two) times daily as needed 1 po bid prn 14 tablet 0   Allergies: Beef Containing Products Anaphylaxis  Cephalexin Anaphylaxis and Hives  Ciprofloxacin Hives  Meloxicam Anaphylaxis  Pork Derived (Porcine) Anaphylaxis  Aspirin-Salicylamide-Caffeine Hives  Nexium [Esomeprazole Magnesium] Hives   Review of Systems:  A comprehensive 14 point ROS was performed, reviewed by me today, and the pertinent orthopaedic findings are documented in the HPI.  Physical Exam: BP 132/88 (BP Location: Left upper arm, Patient Position: Sitting, BP Cuff Size: Large Adult)  Ht 177.8 cm (5\' 10" )  Wt 97.9 kg (215 lb 12.8 oz)  BMI 30.96 kg/m  General/Constitutional: The patient appears to be well-nourished, well-developed, and in no acute distress. Neuro/Psych: Normal mood and affect, oriented to person, place and time. Eyes: Non-icteric. Pupils are equal, round, and reactive to light, and exhibit synchronous movement. ENT: Unremarkable. Lymphatic: No palpable adenopathy. Respiratory: Lungs clear to auscultation, Normal chest excursion, No wheezes, and Non-labored breathing Cardiovascular: Regular rate and rhythm. No murmurs. and No edema, swelling or tenderness, except as noted in detailed exam. Integumentary: No impressive skin lesions present, except as noted in detailed exam. Musculoskeletal: Unremarkable, except as noted in detailed exam.  General: Well  developed, well nourished 42 y.o. male in no apparent distress. Normal affect. Normal communication. Patient answers questions appropriately. The patient has a normal gait. There is no antalgic component. There is no hip lurch.   Right  Lower Extremity: Examination of the right lower extremities reveals no bony abnormality, no edema, and no ecchymosis. The patient has limited active and passive range of motion of the right hip. Positive log rolling maneuver to the right hip. The patient has limited hip abduction and adduction with increased anterior hip pain. The patient has a positive axial load test at the hip joint. The patient has moderate tenderness going from hip flexion into extension. The patient has a negative Patrick's test. The patient is non tender along the greater trochanter region. The patient has a negative Denna Haggard' test bilaterally. There is normal skin warmth. There is normal capillary refill bilaterally.   Neurologic: The patient has a negative straight leg raise. The patient has normal muscle strength testing for the quadriceps, calves, ankle dorsiflexion, ankle plantarflexion, and extensor hallicus longus. The patient has sensation that is intact to light touch. The deep tendon reflexes are normal at the patella and achilles. No clonus is noted.   Imaging: X-rays of bilateral hips were obtained on 02/21/2023. These x-rays do not demonstrate any evidence of acute fracture involving the left hip, no significant degenerative changes of on the left hip. The patient does have significant sclerotic changes involving the right hip with some potential evidence of collapse involving the femoral head indicative of avascular necrosis. There does not appear to be any evidence of dislocation. Decrease joint space along the superior aspect of the acetabulum as well.  Impression: 1. Avascular necrosis of bone of right hip. 2. H/O alcohol abuse.  Plan:  1. Treatment options were discussed today with the patient. 2. X-rays obtained by [facility] clinic physiatry do demonstrate what appears to be evidence of significant sclerotic changes due to underlying advanced avascular necrosis. 3. Had a lengthy discussion with the patient  today's visit, we did discuss that likely the underlying avascular necrosis is due to his alcohol consumption. We discussed alcohol cessation at today's visit. Discussed the potential for referral for alcohol counseling. 4. We discussed different treatment options for his right hip, we did discuss the possibility of a MRI scan for the right hip to see if he is potentially a candidate for a core decompression however we discussed that given the significant sclerotic changes noted on x-ray the a vast necrosis is likely advanced. 5. After a discussion of the risk and benefits the patient would like to proceed with a right total hip arthroplasty. The patient will be scheduled with Dr. Joice Lofts in the future. 6. This document will serve as a surgical history and physical for the patient. The patient can contact the clinic if he has any questions, new symptoms develop or symptoms worsen. 7. The patient will follow-up per standard post-op protocol.    H&P reviewed and patient re-examined. No changes.

## 2023-04-11 NOTE — Anesthesia Preprocedure Evaluation (Signed)
Anesthesia Evaluation  Patient identified by MRN, date of birth, ID band Patient awake    Reviewed: Allergy & Precautions, H&P , NPO status , reviewed documented beta blocker date and time   Airway Mallampati: II  TM Distance: >3 FB Neck ROM: full    Dental  (+) Poor Dentition, Missing, Chipped, Dental Advidsory Given Very poor dentition:   Pulmonary Current Smoker and Patient abstained from smoking.   Pulmonary exam normal        Cardiovascular hypertension, Normal cardiovascular exam     Neuro/Psych  PSYCHIATRIC DISORDERS Anxiety        GI/Hepatic   Endo/Other    Renal/GU      Musculoskeletal   Abdominal   Peds  Hematology   Anesthesia Other Findings Patient declines regional anesthesia due to being to nervous that he would still feel pain. After answering the patient's questions and trying to help the patient's fear, he still declined regional anesthesia. Risks of general anesthesia were given. Patient stated he understood and agrees to proceed.   Past Medical History: No date: Anxiety Smoker UDS pos for benzo & THC Past Surgical History: No date: BACK SURGERY BMI    Body Mass Index: 30.42 kg/m     Reproductive/Obstetrics                             Anesthesia Physical Anesthesia Plan  ASA: 2  Anesthesia Plan: General   Post-op Pain Management:    Induction: Intravenous  PONV Risk Score and Plan: 2 and Ondansetron, Dexamethasone and Midazolam  Airway Management Planned: Oral ETT  Additional Equipment:   Intra-op Plan:   Post-operative Plan: Extubation in OR  Informed Consent: I have reviewed the patients History and Physical, chart, labs and discussed the procedure including the risks, benefits and alternatives for the proposed anesthesia with the patient or authorized representative who has indicated his/her understanding and acceptance.     Dental Advisory  Given  Plan Discussed with: Anesthesiologist, CRNA and Surgeon  Anesthesia Plan Comments: (Patient consented for risks of anesthesia including but not limited to:  - adverse reactions to medications - damage to eyes, teeth, lips or other oral mucosa - nerve damage due to positioning  - sore throat or hoarseness - Damage to heart, brain, nerves, lungs, other parts of body or loss of life  Patient voiced understanding and assent.)       Anesthesia Quick Evaluation

## 2023-04-11 NOTE — Transfer of Care (Signed)
Immediate Anesthesia Transfer of Care Note  Patient: Vernon Allen  Procedure(s) Performed: TOTAL HIP ARTHROPLASTY (Right: Hip)  Patient Location: PACU  Anesthesia Type:General  Level of Consciousness: sedated  Airway & Oxygen Therapy: Patient Spontanous Breathing and Patient connected to nasal cannula oxygen  Post-op Assessment: Report given to RN and Post -op Vital signs reviewed and stable  Post vital signs: Reviewed and stable  Last Vitals:  Vitals Value Taken Time  BP 126/79 04/11/23 1302  Temp 97.5 04/11/23    1302  Pulse 94 04/11/23 1305  Resp 15 04/11/23 1305  SpO2 100 % 04/11/23 1305  Vitals shown include unfiled device data.  Last Pain:  Vitals:   04/11/23 0909  TempSrc: Tympanic  PainSc: 6          Complications: No notable events documented.

## 2023-04-11 NOTE — Evaluation (Signed)
Physical Therapy Evaluation Patient Details Name: Vernon Allen MRN: [id number] DOB: Sep 14, 1980 Today's Date: 04/11/2023  History of Present Illness  42 y/o male with R hip AVN, now s/p R THA (posterior approach)  Clinical Impression  Pt was very eager to work with PT but t/o session struggled with nausea and lightheadedness, ontop of the exquisite pain he had with all movement of the R hip.  Pt displayed ability to do some limited R hip/LE exercises but struggled with all aspects of mobility.  BP soft, generally 90s/60s with minimal changes with movement but feeling very poorly with multiple attempts to sit up.  Pt very motivated to do all he can, but simply could not tolerate mobility acts at this time.  PT will benefit from continued PT to address functional limitations and work toward a safe discharge.        If plan is discharge home, recommend the following: Assist for transportation;Assistance with cooking/housework;A little help with bathing/dressing/bathroom;Help with stairs or ramp for entrance   Can travel by private vehicle        Equipment Recommendations None recommended by PT  Recommendations for Other Services       Functional Status Assessment Patient has had a recent decline in their functional status and demonstrates the ability to make significant improvements in function in a reasonable and predictable amount of time.     Precautions / Restrictions Precautions Precautions: Posterior Hip Precaution Booklet Issued: Yes (comment) Restrictions Weight Bearing Restrictions Per Provider Order: Yes RLE Weight Bearing Per Provider Order: Weight bearing as tolerated      Mobility  Bed Mobility Overal bed mobility: Needs Assistance Bed Mobility: Supine to Sit, Sit to Supine     Supine to sit: Min assist Sit to supine: Min assist   General bed mobility comments: Pt eager to try doing some activity with PT, but ultimately became too nauseous and lightheaded.   Ultimately unable to attain fully upright - BP was soft (90s-100/50-60s) pre and post mobility.    Transfers                   General transfer comment: deferred, pt too dizzy with bed mobility efforts    Ambulation/Gait                  Stairs            Wheelchair Mobility     Tilt Bed    Modified Rankin (Stroke Patients Only)       Balance                                             Pertinent Vitals/Pain Pain Assessment Pain Assessment: 0-10 Pain Score: 4  Pain Location: R hip, increases signficantly with movement    Home Living Family/patient expects to be discharged to:: Private residence Living Arrangements: Spouse/significant other;Children Available Help at Discharge: Family;Available 24 hours/day   Home Access: Stairs to enter Entrance Stairs-Rails: Left Entrance Stairs-Number of Steps: 3   Home Layout: One level Home Equipment: Agricultural consultant (2 wheels);BSC/3in1      Prior Function Prior Level of Function : Independent/Modified Independent;Working/employed (recently doing light duty, less walking 2/2 pain)             Mobility Comments: has not needed to use ADs pre surgery ADLs Comments: independent     Extremity/Trunk Assessment  Upper Extremity Assessment Upper Extremity Assessment: Overall WFL for tasks assessed    Lower Extremity Assessment Lower Extremity Assessment:  (expected post-op weakness, poor tolerance with R LE movement)       Communication   Communication Communication: No apparent difficulties  Cognition Arousal: Suspect due to medications Behavior During Therapy: WFL for tasks assessed/performed (nauseous) Overall Cognitive Status: Within Functional Limits for tasks assessed                                          General Comments General comments (skin integrity, edema, etc.): Pt motivated and eager to work with PT, however could not tolerate mobility  secondary to dizziness    Exercises Total Joint Exercises Ankle Circles/Pumps: AROM, 10 reps Quad Sets: Strengthening, 10 reps Gluteal Sets: Strengthening, 10 reps Heel Slides: AAROM, 5 reps (lightly resisted leg ext) Hip ABduction/ADduction: AROM, 5 reps   Assessment/Plan    PT Assessment Patient needs continued PT services  PT Problem List Decreased strength;Decreased range of motion;Decreased activity tolerance;Decreased balance;Decreased mobility;Decreased safety awareness;Pain;Decreased knowledge of precautions       PT Treatment Interventions DME instruction;Gait training;Stair training;Functional mobility training;Therapeutic activities;Therapeutic exercise;Balance training;Patient/family education    PT Goals (Current goals can be found in the Care Plan section)  Acute Rehab PT Goals Patient Stated Goal: go home PT Goal Formulation: With patient Time For Goal Achievement: 04/24/23 Potential to Achieve Goals: Good    Frequency BID     Co-evaluation               AM-PAC PT "6 Clicks" Mobility  Outcome Measure Help needed turning from your back to your side while in a flat bed without using bedrails?: A Little Help needed moving from lying on your back to sitting on the side of a flat bed without using bedrails?: A Little Help needed moving to and from a bed to a chair (including a wheelchair)?: Total Help needed standing up from a chair using your arms (e.g., wheelchair or bedside chair)?: Total Help needed to walk in hospital room?: Total Help needed climbing 3-5 steps with a railing? : Total 6 Click Score: 10    End of Session   Activity Tolerance: Patient tolerated treatment well (nausea, lightheaded) Patient left: in bed;with call bell/phone within reach;with nursing/sitter in room Nurse Communication: Mobility status PT Visit Diagnosis: Difficulty in walking, not elsewhere classified (R26.2);Muscle weakness (generalized) (M62.81);Pain Pain -  Right/Left: Right Pain - part of body: Hip    Time: 2841-3244 PT Time Calculation (min) (ACUTE ONLY): 32 min   Charges:   PT Evaluation $PT Eval Low Complexity: 1 Low PT Treatments $Therapeutic Exercise: 8-22 mins PT General Charges $$ ACUTE PT VISIT: 1 Visit         Malachi Pro, DPT 04/11/2023, 5:00 PM

## 2023-04-12 ENCOUNTER — Encounter: Payer: Self-pay | Admitting: Surgery

## 2023-04-12 DIAGNOSIS — M1611 Unilateral primary osteoarthritis, right hip: Secondary | ICD-10-CM | POA: Diagnosis not present

## 2023-04-12 DIAGNOSIS — M87151 Osteonecrosis due to drugs, right femur: Secondary | ICD-10-CM | POA: Insufficient documentation

## 2023-04-12 MED ORDER — ACETAMINOPHEN 500 MG PO TABS: 1000.0000 mg | ORAL_TABLET | Freq: Four times a day (QID) | ORAL | 0 refills | Status: DC

## 2023-04-12 MED ORDER — APIXABAN 2.5 MG PO TABS: 2.5000 mg | ORAL_TABLET | Freq: Two times a day (BID) | ORAL | 0 refills | Status: DC

## 2023-04-12 MED ORDER — OXYCODONE HCL 5 MG PO TABS: 5.0000 mg | ORAL_TABLET | ORAL | 0 refills | Status: DC | PRN

## 2023-04-12 MED ORDER — PANTOPRAZOLE SODIUM 40 MG PO TBEC
80.0000 mg | DELAYED_RELEASE_TABLET | Freq: Every day | ORAL | Status: DC
  Administered 2023-04-12: 80 mg via ORAL
  Filled 2023-04-12: qty 2

## 2023-04-12 MED ORDER — ONDANSETRON HCL 4 MG PO TABS: 4.0000 mg | ORAL_TABLET | Freq: Four times a day (QID) | ORAL | 0 refills | Status: DC | PRN

## 2023-04-12 NOTE — Discharge Instructions (Signed)
Instructions after Total Hip Replacement     J. Derald Macleod, M.D.  Valeria Batman, PA-C     Dept. of Orthopaedics & Sports Medicine  [facility]  [address]  [place], [place]  [address]  Phone: [phone]   Fax: [phone]    DIET: Drink plenty of non-alcoholic fluids. Resume your normal diet. Include foods high in fiber.  ACTIVITY:  You may use crutches or a walker with weight-bearing as tolerated, unless instructed otherwise. You may be weaned off of the walker or crutches by your Physical Therapist.  Do NOT reach below the level of your knees or cross your legs until allowed.    Continue doing gentle exercises. Exercising will reduce the pain and swelling, increase motion, and prevent muscle weakness.   Please continue to use the TED compression stockings for 6 weeks. You may remove the stockings at night, but should reapply them in the morning. Do not drive or operate any equipment until instructed.  WOUND CARE:  Continue to use ice packs periodically to reduce pain and swelling. Keep the incision clean and dry. You may bathe or shower after the staples are removed at the first office visit following surgery.  MEDICATIONS: You may resume your regular medications. Please take the pain medication as prescribed on the medication. Do not take pain medication on an empty stomach. You have been given a prescription for a blood thinner to prevent blood clots. Please take the medication as instructed. (NOTE: After completing a 2 week course of Eliquis, take one Enteric-coated aspirin once a day.) Pain medications and iron supplements can cause constipation. Use a stool softener (Senokot or Colace) on a daily basis and a laxative (dulcolax or miralax) as needed. Do not drive or drink alcoholic beverages when taking pain medications.  CALL THE OFFICE FOR: Temperature above 101 degrees Excessive bleeding or drainage on the dressing. Excessive swelling, coldness, or  paleness of the toes. Persistent nausea and vomiting.  FOLLOW-UP:  You should have an appointment to return to the office in 2 weeks after surgery. Arrangements have been made for continuation of Physical Therapy (either home therapy or outpatient therapy).

## 2023-04-12 NOTE — Progress Notes (Signed)
  Subjective: 1 Day Post-Op Procedure(s) (LRB): TOTAL HIP ARTHROPLASTY (Right) Patient reports pain as 6 on 0-10 scale.   Patient is well, and has had no acute complaints or problems Plan is to go Home after hospital stay. Negative for chest pain and shortness of breath Fever: no Gastrointestinal:Negative for nausea and vomiting Reports he is passing gas without pain this AM.  Objective: Vital signs in last 24 hours: Temp:  [97.8 F (36.6 C)-99.1 F (37.3 C)] 98.4 F (36.9 C) (12/20 0741) Pulse Rate:  [71-100] 85 (12/20 0741) Resp:  [10-18] 18 (12/20 0741) BP: (113-155)/(67-95) 129/73 (12/20 0741) SpO2:  [94 %-100 %] 98 % (12/20 0741) Weight:  [99.7 kg] 99.7 kg (12/19 2337)  Intake/Output from previous day:  Intake/Output Summary (Last 24 hours) at 04/12/2023 0759 Last data filed at 04/12/2023 0742 Gross per 24 hour  Intake 1689.58 ml  Output 1400 ml  Net 289.58 ml    Intake/Output this shift: Total I/O In: -  Out: 200 [Urine:200]  Labs: No results for input(s): "HGB" in the last 72 hours. No results for input(s): "WBC", "RBC", "HCT", "PLT" in the last 72 hours. No results for input(s): "NA", "K", "CL", "CO2", "BUN", "CREATININE", "GLUCOSE", "CALCIUM" in the last 72 hours. No results for input(s): "LABPT", "INR" in the last 72 hours.   EXAM General - Patient is Alert, Appropriate, and Oriented Extremity - ABD soft Neurovascular intact Dorsiflexion/Plantar flexion intact Incision: dressing C/D/I No cellulitis present Compartment soft Dressing/Incision - clean, dry, no drainage noted to the right hip honeycomb dressing. Motor Function - intact, moving foot and toes well on exam.  Abdomen soft with intact bowel sounds this AM.  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   Weight as of this encounter: 99.7 kg. Advance diet Up with therapy D/C IV fluids when tolerating po intake.  Vitals reviewed this AM. Does report some reflux, no chest pain or SOB. Up with therapy today. He is passing gas without pain. Plan for discharge home today pending his progress with PT.  DVT Prophylaxis - TED hose and SCDs and Eliquis Weight-Bearing as tolerated to right leg  J. Horris Latino, PA-C [facility] Orthopaedic Surgery 04/12/2023, 7:59 AM

## 2023-04-12 NOTE — Progress Notes (Signed)
Physical Therapy Treatment Patient Details Name: Vernon Allen MRN: [id number] DOB: 26-Jun-1980 Today's Date: 04/12/2023   History of Present Illness 42 y/o male with R hip AVN, now s/p R THA (posterior approach)    PT Comments  Pt was long sitting in bed upon arrival. He is A and O x 4. Slightly anxious but that improved with activity. Pt endorses a little heartburn but did not want pain meds prior to getting OOB and performing PT session. He does request pain meds after session but endorses pain as tolerable (5/10). Pt was able to exit bed, stand to RW, and perform stairs without physical assistance. Vcs for improved technique and sequencing only. Care team made aware that pt has cleared all acute PT protocols and is cleared for safe DC home once deemed medically ready. HHPT recommended at DC to maximize his independence and safety while returning to PLOF.     If plan is discharge home, recommend the following: Assist for transportation;Assistance with cooking/housework;A little help with bathing/dressing/bathroom;Help with stairs or ramp for entrance     Equipment Recommendations  None recommended by PT       Precautions / Restrictions Precautions Precautions: Posterior Hip Precaution Booklet Issued: Yes (comment) Restrictions Weight Bearing Restrictions Per Provider Order: Yes RLE Weight Bearing Per Provider Order: Weight bearing as tolerated     Mobility  Bed Mobility Overal bed mobility: Modified Independent Bed Mobility: Supine to Sit, Sit to Supine  Supine to sit: Supervision Sit to supine: Supervision    Transfers Overall transfer level: Modified independent Equipment used: Rolling walker (2 wheels) Transfers: Sit to/from Stand Sit to Stand: Supervision     Ambulation/Gait Ambulation/Gait assistance: Supervision Gait Distance (Feet): 220 Feet Assistive device: Rolling walker (2 wheels) Gait Pattern/deviations: Step-through pattern, Antalgic Gait velocity:  decreased  General Gait Details: Pt demonstrates safe gait abilities with use of RW   Stairs Stairs: Yes Stairs assistance: Supervision Stair Management: One rail Left, Step to pattern, Forwards Number of Stairs: 4 General stair comments: no difficulty or safety concerns during stair performance    Balance Overall balance assessment: Modified Independent       Cognition Arousal: Alert Behavior During Therapy: Anxious, WFL for tasks assessed/performed Overall Cognitive Status: Within Functional Limits for tasks assessed    General Comments: Pt is A and O x 4. A little anxious but that improved throughout session           General Comments General comments (skin integrity, edema, etc.): reviewed hip precautions, car transfers, and importance of continueing routine mobility at home.      Pertinent Vitals/Pain Pain Assessment Pain Assessment: 0-10 Pain Score: 5  Pain Location: R hip, increases signficantly with movement Pain Descriptors / Indicators: Discomfort     PT Goals (current goals can now be found in the care plan section) Acute Rehab PT Goals Patient Stated Goal: go home Progress towards PT goals: Progressing toward goals    Frequency    BID       AM-PAC PT "6 Clicks" Mobility   Outcome Measure  Help needed turning from your back to your side while in a flat bed without using bedrails?: A Little Help needed moving from lying on your back to sitting on the side of a flat bed without using bedrails?: A Little Help needed moving to and from a bed to a chair (including a wheelchair)?: A Little Help needed standing up from a chair using your arms (e.g., wheelchair or bedside chair)?: A Little  Help needed to walk in hospital room?: A Little Help needed climbing 3-5 steps with a railing? : A Little 6 Click Score: 18    End of Session   Activity Tolerance: Patient tolerated treatment well Patient left: in bed;with call bell/phone within reach;with  nursing/sitter in room Nurse Communication: Mobility status PT Visit Diagnosis: Difficulty in walking, not elsewhere classified (R26.2);Muscle weakness (generalized) (M62.81);Pain Pain - Right/Left: Right Pain - part of body: Hip     Time: 0822-0853 PT Time Calculation (min) (ACUTE ONLY): 31 min  Charges:    $Gait Training: 8-22 mins $Therapeutic Activity: 8-22 mins PT General Charges $$ ACUTE PT VISIT: 1 Visit                     Jetta Lout PTA 04/12/23, 9:03 AM

## 2023-04-12 NOTE — Plan of Care (Signed)
  Problem: Education: Goal: Knowledge of General Education information will improve Description: Including pain rating scale, medication(s)/side effects and non-pharmacologic comfort measures Outcome: Adequate for Discharge   Problem: Health Behavior/Discharge Planning: Goal: Ability to manage health-related needs will improve Outcome: Adequate for Discharge   Problem: Clinical Measurements: Goal: Ability to maintain clinical measurements within normal limits will improve Outcome: Adequate for Discharge Goal: Will remain free from infection Outcome: Adequate for Discharge Goal: Diagnostic test results will improve Outcome: Adequate for Discharge Goal: Respiratory complications will improve Outcome: Adequate for Discharge Goal: Cardiovascular complication will be avoided Outcome: Adequate for Discharge   Problem: Activity: Goal: Risk for activity intolerance will decrease Outcome: Adequate for Discharge   Problem: Nutrition: Goal: Adequate nutrition will be maintained Outcome: Adequate for Discharge   Problem: Coping: Goal: Level of anxiety will decrease Outcome: Adequate for Discharge   Problem: Elimination: Goal: Will not experience complications related to bowel motility Outcome: Adequate for Discharge Goal: Will not experience complications related to urinary retention Outcome: Adequate for Discharge   Problem: Pain Management: Goal: General experience of comfort will improve Outcome: Adequate for Discharge   Problem: Safety: Goal: Ability to remain free from injury will improve Outcome: Adequate for Discharge   Problem: Skin Integrity: Goal: Risk for impaired skin integrity will decrease Outcome: Adequate for Discharge   Problem: Education: Goal: Knowledge of the prescribed therapeutic regimen will improve Outcome: Adequate for Discharge Goal: Understanding of discharge needs will improve Outcome: Adequate for Discharge Goal: Individualized Educational  Video(s) Outcome: Adequate for Discharge   Problem: Activity: Goal: Ability to avoid complications of mobility impairment will improve Outcome: Adequate for Discharge Goal: Ability to tolerate increased activity will improve Outcome: Adequate for Discharge   Problem: Clinical Measurements: Goal: Postoperative complications will be avoided or minimized Outcome: Adequate for Discharge   Problem: Pain Management: Goal: Pain level will decrease with appropriate interventions Outcome: Adequate for Discharge   Problem: Skin Integrity: Goal: Will show signs of wound healing Outcome: Adequate for Discharge

## 2023-04-12 NOTE — Plan of Care (Signed)
  Problem: Education: Goal: Knowledge of General Education information will improve Description: Including pain rating scale, medication(s)/side effects and non-pharmacologic comfort measures Outcome: Progressing   Problem: Activity: Goal: Risk for activity intolerance will decrease Outcome: Progressing   Problem: Pain Management: Goal: General experience of comfort will improve Outcome: Progressing

## 2023-04-12 NOTE — Discharge Summary (Signed)
Physician Discharge Summary  Patient ID: Vernon Allen MRN: [id number] DOB/AGE: [birth year] 42 y.o.  Admit date: 04/11/2023 Discharge date: 04/12/2023  Admission Diagnoses:  Status post total hip replacement, right [Z96.641] Degenerative joint disease, right hip  Avascular necrosis of the right hip.  Discharge Diagnoses: Patient Active Problem List   Diagnosis Date Noted   Status post total hip replacement, right 04/11/2023   Elevated blood pressure reading without diagnosis of hypertension 03/22/2022   Arthritis 03/22/2022   Class 1 obesity without serious comorbidity with body mass index (BMI) of 34.0 to 34.9 in adult 03/22/2022   Tobacco abuse counseling 03/22/2022   Appendicitis 10/09/2018   Acute appendicitis    Perforated appendicitis 08/28/2018    Past Medical History:  Diagnosis Date   Alcohol abuse    Allergy to alpha-gal    Anxiety    Avascular necrosis of bone of right hip (HCC) 02/2023   Bipolar 1 disorder (HCC)    Elevated blood pressure reading with diagnosis of hypertension    Perforated appendicitis 10/09/2018   Perirectal abscess 05/19/2021     Transfusion: None.   Consultants (if any):   Discharged Condition: Improved  Hospital Course: Vernon Allen is an 42 y.o. male who was admitted 04/11/2023 with a diagnosis of right hip degenerative joint disease and avascular necrosis and went to the operating room on 04/11/2023 and underwent the above named procedures.    Surgeries: Procedure(s): TOTAL HIP ARTHROPLASTY on 04/11/2023 Patient tolerated the surgery well. Taken to PACU where he was stabilized and then transferred to the orthopedic floor.  Started on Eliquis 2.5mg  every 12 hrs. Heels elevated on bed with rolled towels. No evidence of DVT. Negative Homan. Physical therapy started on day #1 for gait training and transfer. OT started day #1 for ADL and assisted devices.  Patient's IV was removed on POD1.  Implants: Biomet press-fit  system with a #13 laterally offset Echo femoral stem, a 54 mm acetabular shell with an E-poly hi-wall liner, and a 36 mm ceramic head with a +6 mm neck.   He was given perioperative antibiotics:  Anti-infectives (From admission, onward)    Start     Dose/Rate Route Frequency Ordered Stop   04/11/23 1815  vancomycin (VANCOCIN) IVPB 1000 mg/200 mL premix        1,000 mg 200 mL/hr over 60 Minutes Intravenous Every 12 hours 04/11/23 1806 04/11/23 2240   04/11/23 0600  gentamicin (GARAMYCIN) 520 mg in dextrose 5 % 100 mL IVPB        5 mg/kg  104.3 kg 113 mL/hr over 60 Minutes Intravenous On call to O.R. 04/10/23 2145 04/11/23 1151   04/11/23 0600  vancomycin (VANCOCIN) IVPB 1000 mg/200 mL premix        1,000 mg 200 mL/hr over 60 Minutes Intravenous On call to O.R. 04/10/23 2145 04/11/23 1047     .  He was given sequential compression devices, early ambulation, and Eliquis for DVT prophylaxis.  He benefited maximally from the hospital stay and there were no complications.    Recent vital signs:  Vitals:   04/12/23 0449 04/12/23 0741  BP: 127/72 129/73  Pulse: 71 85  Resp: 18 18  Temp: 98 F (36.7 C) 98.4 F (36.9 C)  SpO2: 95% 98%    Recent laboratory studies:  Lab Results  Component Value Date   HGB 13.3 04/05/2023   HGB 13.3 11/22/2021   HGB 13.6 11/01/2021   Lab Results  Component Value Date   WBC  5.9 04/05/2023   PLT 198 04/05/2023   No results found for: "INR" Lab Results  Component Value Date   NA 135 04/05/2023   K 3.2 (L) 04/05/2023   CL 101 04/05/2023   CO2 25 04/05/2023   BUN 11 04/05/2023   CREATININE 0.66 04/05/2023   GLUCOSE 83 04/05/2023    Discharge Medications:   Allergies as of 04/12/2023       Reactions   Beef-derived Drug Products Anaphylaxis   Keflex [cephalexin] Anaphylaxis   Mobic [meloxicam] Anaphylaxis   Pork-derived Products Anaphylaxis   Bc Fast Pain Relief [aspirin-salicylamide-caffeine] Hives   Chicken Protein Itching    Ciprofloxacin Hives   Esomeprazole Magnesium Hives   Fish-derived Products Itching        Medication List     STOP taking these medications    ibuprofen 200 MG tablet Commonly known as: ADVIL       TAKE these medications    acetaminophen 500 MG tablet Commonly known as: TYLENOL Take 2 tablets (1,000 mg total) by mouth every 6 (six) hours.   apixaban 2.5 MG Tabs tablet Commonly known as: ELIQUIS Take 1 tablet (2.5 mg total) by mouth 2 (two) times daily.   NYQUIL PO Take by mouth.   ondansetron 4 MG tablet Commonly known as: ZOFRAN Take 1 tablet (4 mg total) by mouth every 6 (six) hours as needed for nausea.   oxyCODONE 5 MG immediate release tablet Commonly known as: Oxy IR/ROXICODONE Take 1-2 tablets (5-10 mg total) by mouth every 4 (four) hours as needed for moderate pain (pain score 4-6).               Durable Medical Equipment  (From admission, onward)           Start     Ordered   04/11/23 1311  DME Bedside commode  Once       Question:  Patient needs a bedside commode to treat with the following condition  Answer:  Status post total hip replacement, right   04/11/23 1310   04/11/23 1311  DME 3 n 1  Once        04/11/23 1310   04/11/23 1311  DME Walker rolling  Once       Question Answer Comment  Walker: With 5 Inch Wheels   Patient needs a walker to treat with the following condition Status post total hip replacement, right      04/11/23 1310            Diagnostic Studies: DG HIP UNILAT W OR W/O PELVIS 2-3 VIEWS RIGHT Result Date: 04/11/2023 CLINICAL DATA:  Status post total right hip arthroplasty. EXAM: DG HIP (WITH OR WITHOUT PELVIS) 2-3V RIGHT COMPARISON:  Right femur radiographs 11/18/2019, CT chest, abdomen, and pelvis 11/22/2021 FINDINGS: Interval total right hip arthroplasty. No perihardware lucency is seen to indicate hardware failure or loosening. Expected postoperative changes including lateral greater than medial right hip  subcutaneous air. Lateral right hip surgical skin staples. Mild superior left femoroacetabular joint space narrowing. Minimal sclerosis within the superior aspect of the left femoral head as seen on prior CT, suspicious for avascular necrosis. No overlying cortical collapse. IMPRESSION: Interval total right hip arthroplasty without evidence of hardware failure. Electronically Signed   By: Neita Garnet M.D.   On: 04/11/2023 16:03   Disposition: Plan for discharge home today pending his progress with PT.   Follow-up Information     Anson [place], PA-C Follow up in 14 day(s).  Specialty: Physician Assistant Why: Mindi Slicker information: [address] [place] [address] [phone]                Signed: Meriel Pica PA-C 04/12/2023, 9:44 AM

## 2023-04-15 LAB — SURGICAL PATHOLOGY

## 2023-06-14 ENCOUNTER — Other Ambulatory Visit (HOSPITAL_COMMUNITY)
Admission: EM | Admit: 2023-06-14 | Discharge: 2023-06-15 | Disposition: A | Discharge status: Other Acute Inpatient Hospital | Payer: No Payment, Other | Attending: Psychiatry | Admitting: Psychiatry

## 2023-06-14 DIAGNOSIS — F1090 Alcohol use, unspecified, uncomplicated: Secondary | ICD-10-CM | POA: Diagnosis present

## 2023-06-14 DIAGNOSIS — F1020 Alcohol dependence, uncomplicated: Secondary | ICD-10-CM | POA: Diagnosis not present

## 2023-06-14 DIAGNOSIS — R45851 Suicidal ideations: Secondary | ICD-10-CM | POA: Insufficient documentation

## 2023-06-14 DIAGNOSIS — F319 Bipolar disorder, unspecified: Secondary | ICD-10-CM | POA: Diagnosis not present

## 2023-06-14 DIAGNOSIS — G8929 Other chronic pain: Secondary | ICD-10-CM | POA: Diagnosis not present

## 2023-06-14 DIAGNOSIS — F1094 Alcohol use, unspecified with alcohol-induced mood disorder: Secondary | ICD-10-CM | POA: Diagnosis not present

## 2023-06-14 LAB — URINALYSIS, ROUTINE W REFLEX MICROSCOPIC
Bilirubin Urine: NEGATIVE
Glucose, UA: NEGATIVE mg/dL
Hgb urine dipstick: NEGATIVE
Ketones, ur: NEGATIVE mg/dL
Leukocytes,Ua: NEGATIVE
Nitrite: NEGATIVE
Protein, ur: NEGATIVE mg/dL
Specific Gravity, Urine: 1.026 (ref 1.005–1.030)
pH: 5 (ref 5.0–8.0)

## 2023-06-14 LAB — POCT URINE DRUG SCREEN - MANUAL ENTRY (I-SCREEN)
POC Amphetamine UR: NOT DETECTED
POC Buprenorphine (BUP): NOT DETECTED
POC Cocaine UR: NOT DETECTED
POC Marijuana UR: NOT DETECTED
POC Methadone UR: NOT DETECTED
POC Methamphetamine UR: NOT DETECTED
POC Morphine: NOT DETECTED
POC Oxazepam (BZO): POSITIVE — AB
POC Oxycodone UR: NOT DETECTED
POC Secobarbital (BAR): NOT DETECTED

## 2023-06-14 LAB — CBC WITH DIFFERENTIAL/PLATELET
Abs Immature Granulocytes: 0.02 10*3/uL (ref 0.00–0.07)
Basophils Absolute: 0.1 10*3/uL (ref 0.0–0.1)
Basophils Relative: 2 %
Eosinophils Absolute: 0.1 10*3/uL (ref 0.0–0.5)
Eosinophils Relative: 2 %
HCT: 39.1 % (ref 39.0–52.0)
Hemoglobin: 13.4 g/dL (ref 13.0–17.0)
Immature Granulocytes: 1 %
Lymphocytes Relative: 47 %
Lymphs Abs: 1.8 10*3/uL (ref 0.7–4.0)
MCH: 33 pg (ref 26.0–34.0)
MCHC: 34.3 g/dL (ref 30.0–36.0)
MCV: 96.3 fL (ref 80.0–100.0)
Monocytes Absolute: 0.5 10*3/uL (ref 0.1–1.0)
Monocytes Relative: 13 %
Neutro Abs: 1.3 10*3/uL — ABNORMAL LOW (ref 1.7–7.7)
Neutrophils Relative %: 35 %
Platelets: 235 10*3/uL (ref 150–400)
RBC: 4.06 MIL/uL — ABNORMAL LOW (ref 4.22–5.81)
RDW: 13.5 % (ref 11.5–15.5)
WBC: 3.8 10*3/uL — ABNORMAL LOW (ref 4.0–10.5)
nRBC: 0 % (ref 0.0–0.2)

## 2023-06-14 LAB — LIPID PANEL
Cholesterol: 218 mg/dL — ABNORMAL HIGH (ref 0–200)
HDL: 79 mg/dL (ref >40)
LDL Cholesterol: UNDETERMINED mg/dL (ref 0–99)
Total CHOL/HDL Ratio: 2.8 {ratio}
Triglycerides: 596 mg/dL — ABNORMAL HIGH (ref <150)
VLDL: UNDETERMINED mg/dL (ref 0–40)

## 2023-06-14 LAB — COMPREHENSIVE METABOLIC PANEL
ALT: 32 U/L (ref 0–44)
AST: 135 U/L — ABNORMAL HIGH (ref 15–41)
Albumin: 3.8 g/dL (ref 3.5–5.0)
Alkaline Phosphatase: 148 U/L — ABNORMAL HIGH (ref 38–126)
Anion gap: 14 (ref 5–15)
BUN: 9 mg/dL (ref 6–20)
CO2: 25 mmol/L (ref 22–32)
Calcium: 9 mg/dL (ref 8.9–10.3)
Chloride: 105 mmol/L (ref 98–111)
Creatinine, Ser: 0.69 mg/dL (ref 0.61–1.24)
GFR, Estimated: >60 mL/min (ref >60)
Glucose, Bld: 93 mg/dL (ref 70–99)
Potassium: 3.4 mmol/L — ABNORMAL LOW (ref 3.5–5.1)
Sodium: 144 mmol/L (ref 135–145)
Total Bilirubin: 0.7 mg/dL (ref 0.0–1.2)
Total Protein: 6.8 g/dL (ref 6.5–8.1)

## 2023-06-14 LAB — LDL CHOLESTEROL, DIRECT: Direct LDL: 87 mg/dL (ref 0–99)

## 2023-06-14 LAB — ETHANOL: Alcohol, Ethyl (B): 82 mg/dL — ABNORMAL HIGH (ref <10)

## 2023-06-14 LAB — HEMOGLOBIN A1C
Hgb A1c MFr Bld: 4.8 % (ref 4.8–5.6)
Mean Plasma Glucose: 91.06 mg/dL

## 2023-06-14 LAB — TSH: TSH: 1.115 u[IU]/mL (ref 0.350–4.500)

## 2023-06-14 LAB — CBG MONITORING, ED: Glucose-Capillary: 96

## 2023-06-14 MED ORDER — LORAZEPAM 2 MG/ML IJ SOLN: 2.0000 mg | Freq: Three times a day (TID) | INTRAMUSCULAR | Status: DC | PRN

## 2023-06-14 MED ORDER — LORAZEPAM 1 MG PO TABS
1.0000 mg | ORAL_TABLET | Freq: Four times a day (QID) | ORAL | Status: DC
  Administered 2023-06-14 – 2023-06-15 (×3): 1 mg via ORAL
  Filled 2023-06-14 (×3): qty 1

## 2023-06-14 MED ORDER — HALOPERIDOL 5 MG PO TABS: 5.0000 mg | ORAL_TABLET | Freq: Three times a day (TID) | ORAL | Status: DC | PRN

## 2023-06-14 MED ORDER — DIPHENHYDRAMINE HCL 50 MG PO CAPS: 50.0000 mg | ORAL_CAPSULE | Freq: Three times a day (TID) | ORAL | Status: DC | PRN

## 2023-06-14 MED ORDER — THIAMINE HCL 100 MG/ML IJ SOLN
100.0000 mg | Freq: Once | INTRAMUSCULAR | Status: AC
  Administered 2023-06-14: 100 mg via INTRAMUSCULAR
  Filled 2023-06-14: qty 2

## 2023-06-14 MED ORDER — DIPHENHYDRAMINE HCL 50 MG/ML IJ SOLN: 50.0000 mg | Freq: Three times a day (TID) | INTRAMUSCULAR | Status: DC | PRN

## 2023-06-14 MED ORDER — MAGNESIUM HYDROXIDE 400 MG/5ML PO SUSP: 30.0000 mL | Freq: Every day | ORAL | Status: DC | PRN

## 2023-06-14 MED ORDER — HYDROXYZINE HCL 25 MG PO TABS: 25.0000 mg | ORAL_TABLET | Freq: Three times a day (TID) | ORAL | Status: DC | PRN

## 2023-06-14 MED ORDER — OLANZAPINE 10 MG IM SOLR: 10.0000 mg | Freq: Three times a day (TID) | INTRAMUSCULAR | Status: DC | PRN

## 2023-06-14 MED ORDER — ADULT MULTIVITAMIN W/MINERALS CH
1.0000 | ORAL_TABLET | Freq: Every day | ORAL | Status: DC
  Administered 2023-06-14 – 2023-06-15 (×2): 1 via ORAL
  Filled 2023-06-14 (×2): qty 1

## 2023-06-14 MED ORDER — HALOPERIDOL LACTATE 5 MG/ML IJ SOLN: 5.0000 mg | Freq: Three times a day (TID) | INTRAMUSCULAR | Status: DC | PRN

## 2023-06-14 MED ORDER — LORAZEPAM 1 MG PO TABS: 1.0000 mg | ORAL_TABLET | Freq: Every day | ORAL | Status: DC

## 2023-06-14 MED ORDER — ALUM & MAG HYDROXIDE-SIMETH 200-200-20 MG/5ML PO SUSP: 30.0000 mL | ORAL | Status: DC | PRN

## 2023-06-14 MED ORDER — TRAZODONE HCL 50 MG PO TABS
50.0000 mg | ORAL_TABLET | Freq: Every evening | ORAL | Status: DC | PRN
  Filled 2023-06-14: qty 1

## 2023-06-14 MED ORDER — THIAMINE MONONITRATE 100 MG PO TABS
100.0000 mg | ORAL_TABLET | Freq: Every day | ORAL | Status: DC
  Administered 2023-06-15: 100 mg via ORAL
  Filled 2023-06-14: qty 1

## 2023-06-14 MED ORDER — HALOPERIDOL LACTATE 5 MG/ML IJ SOLN
10.0000 mg | Freq: Three times a day (TID) | INTRAMUSCULAR | Status: DC | PRN
Start: 2023-06-14 — End: 2023-06-15

## 2023-06-14 MED ORDER — LORAZEPAM 1 MG PO TABS: 1.0000 mg | ORAL_TABLET | Freq: Two times a day (BID) | ORAL | Status: DC

## 2023-06-14 MED ORDER — LOPERAMIDE HCL 2 MG PO CAPS
2.0000 mg | ORAL_CAPSULE | ORAL | Status: DC | PRN
Start: 2023-06-14 — End: 2023-06-15

## 2023-06-14 MED ORDER — ACETAMINOPHEN 325 MG PO TABS
650.0000 mg | ORAL_TABLET | Freq: Four times a day (QID) | ORAL | Status: DC | PRN
  Administered 2023-06-15: 650 mg via ORAL
  Filled 2023-06-14: qty 2

## 2023-06-14 MED ORDER — LORAZEPAM 1 MG PO TABS: 1.0000 mg | ORAL_TABLET | Freq: Three times a day (TID) | ORAL | Status: DC

## 2023-06-14 MED ORDER — OXYCODONE HCL 5 MG PO TABS: 5.0000 mg | ORAL_TABLET | Freq: Two times a day (BID) | ORAL | Status: DC | PRN

## 2023-06-14 MED ORDER — ONDANSETRON 4 MG PO TBDP: 4.0000 mg | ORAL_TABLET | Freq: Four times a day (QID) | ORAL | Status: DC | PRN
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
## 2023-06-14 NOTE — Progress Notes (Signed)
   06/14/23 1203  [facility] Triage Screening (Walk-ins at [facility] only)  How Did You Hear About [place]? Legal System  What Is the Reason for Your Visit/Call Today? Vernon Allen is a 43 year old male presenting to [facility] escorted by [employer], under IVC. Pt reports his mother tries to control his life. He mentions that his mother took out an IVC on him for false statements. Pt does report that he drank 2 days ago and he drinks weekly. However, pt denies drug use, Si, Hi and Avh. Pt is unsure why he was brought here.  How Long Has This Been Causing You Problems? <Week  Have You Recently Had Any Thoughts About Hurting Yourself? No  Are You Planning to Commit Suicide/Harm Yourself At This time? No  Have you Recently Had Thoughts About Hurting Someone Vernon Allen? No  Are You Planning To Harm Someone At This Time? No  Physical Abuse Denies  Verbal Abuse Denies  Sexual Abuse Denies  Exploitation of patient/patient's resources Denies  Self-Neglect Denies  Possible abuse reported to: Other (Comment)  Are you currently experiencing any auditory, visual or other hallucinations? No  Have You Used Any Alcohol or Drugs in the Past 24 Hours? No  Do you have any current medical co-morbidities that require immediate attention? No  Clinician description of patient physical appearance/behavior: calm, cooperative  What Do You Feel Would Help You the Most Today? Treatment for Depression or other mood problem  If access to [facility] was not available, would you have sought care in the Emergency Department? No  Determination of Need Routine (7 days)  Options For Referral Intensive Outpatient Therapy

## 2023-06-14 NOTE — ED Notes (Addendum)
Pt IVC'd to admit to [facility]. Report completed. Labs drawn without difficulty to L AC. Pt denies SI/HI/AVH. Safety maintained. FSBS 96

## 2023-06-14 NOTE — ED Notes (Signed)
Provider notified staff of change in POC. Pt admitted to observation unit awaiting inpatient placement due to being IVC'd for SI w/plan to shoot self in the head. Pt denies allegations noted on IVC paperwork. Pt states, "I stopped drinking because my girlfriend told me this was my final straw. I drank last night because yesterday marked the 3 yr anniversary death of my father. I don't know what I really did or said but I do know that I don't want to kill myself or no one else. My mother always lie on me to get me locked up. I just can't be around here at this moment. She lie too much". Denies AVH but rambles on in speech. Pt reports recent Hip replacement to R hip. Pt positive for Oxy's. Oriented to unit and unit rules. Meal and drink offered. Pt calm, cooperative with staff. No acute distress noted. Pt denies withdrawal sx from ETOH. Will continue to monitor for safety.

## 2023-06-14 NOTE — ED Notes (Signed)
 Patient observed/assessed in bed/chair resting quietly appearing in no distress and verbalizing no complaints at this time. Will continue to monitor.

## 2023-06-14 NOTE — ED Notes (Signed)
Patient observed/assessed at bedside. Patient alert and oriented x 4. Affect is sullen/sad. Patient states that he is feeling better and explains that he is "beginning to accept the situation" referring to being under IVC. Patient denies pain and anxiety. He denies A/V/H. He denies having any thoughts/plan of self harm and harm towards others. Fluid and snack offered. Patient states that appetite has been good throughout the day. Verbalizes no further complaints at this time. Will continue to monitor and support.

## 2023-06-14 NOTE — BH Assessment (Signed)
Comprehensive Clinical Assessment (CCA) Note  06/14/2023 KALEV TEMME [id number]  Disposition: Per Starleen Blue, NP Patient is recommended for inpatient treatment.   The patient demonstrates the following risk factors for suicide: Chronic risk factors for suicide include: psychiatric disorder of bipolar d/o . Acute risk factors for suicide include: family or marital conflict. Protective factors for this patient include: positive social support and responsibility to others (children, family). Considering these factors, the overall suicide risk at this point appears to be low. Patient is appropriate for outpatient follow up.  Patient is a 43 year old male who presents voluntarily to [facility].  Patient was brought in by [facility] as he has been involuntarily committed aesthetician by his mother [email or web address] [phone]).  Per petition " responded has been previously diagnosed with bipolar disorder, he is not currently taking any medication for his mental health issues.  He has history of mental commitments many years ago.  He has been making suicidal ideations.  Last night he called his son and stated that" tonight I feel like a bad person and I want to end it."  He told his mother he wanted to blow his brains out.  Family states that he has a weapon in the house.  Respondent is out on medical leave from a surgery and is drinking excessively and taking pills family is concerned for his wellbeing as he continues to regress while not tending to his mental health and abusing alcohol and pain pills.  Patient denies knowing, with him reporting that he wanted to end his life.  His mom is just upset with him because she cannot control him.  Patient acknowledges that he has been drinking a lot especially since he has been out on the unit with his surgery.  Patient reports that his girlfriend recently threatened to leave the relationship if he did not cut back on his alcohol consumption.   Patient states that he is trying to cut back without having withdrawal symptoms.  He reports past history of stopping alcohol consumption and experiencing tremors and sweats.  Patient admits to owning some shotguns but says they are locked away in his son's room.  Patient is divorced and father of 2 sons ages 12 and 71.  Patient dropped out of high school but earned his GED and was able to go into the [employer].  Due to his job in [employer] he occasionally had to see a therapist to make sure that he was mentally fit to continue his job he currently works as a Games developer.  Patient denies having suicidal ideation or homicidal ideation or any auditory or visual hallucinations.  Patient denied any other substance use besides alcohol  MSE: Patent is fairly groomed.  He appears to be alert and oriented x4,  Patient's speech is clear and coherent.  With good eye contact.  Patient's mood is depressed and anxious with congruent affect.There is no indication that the patient is currently responding to internal stimuli or experiencing delusional thought content.  Patient was cooperative throughout assessment.    Chief Complaint:  Chief Complaint  Patient presents with   IVC   Visit Diagnosis: Alcohol induced mood disorder   CCA Screening, Triage and Referral (STR)  Patient Reported Information How did you hear about [place]? Legal System  What Is the Reason for Your Visit/Call Today? Deeg is a 43 year old male presenting to [facility] escorted by [employer], under IVC. Pt reports his mother tries to control his life. He mentions that his  mother took out an IVC on him for false statements. Pt does report that he drank 2 days ago and he drinks weekly. However, pt denies drug use, Si, Hi and Avh. Pt is unsure why he was brought here.  How Long Has This Been Causing You Problems? <Week  What Do You Feel Would Help You the Most Today? Treatment for Depression or other mood problem   Have You Recently Had Any  Thoughts About Hurting Yourself? No  Are You Planning to Commit Suicide/Harm Yourself At This time? No   Flowsheet Row ED from 06/14/2023 in [facility] (Discharged) from 04/11/2023 in [facility] (1A) ED from 11/23/2022 in [facility] at [facility]  C-SSRS RISK CATEGORY No Risk No Risk No Risk       Have you Recently Had Thoughts About Hurting Someone Karolee Ohs? No  Are You Planning to Harm Someone at This Time? No  Explanation: N/A   Have You Used Any Alcohol or Drugs in the Past 24 Hours? No  How Long Ago Did You Use Drugs or Alcohol? Last night What Did You Use and How Much? 1/2 of a fifth of liquor Do You Currently Have a Therapist/Psychiatrist? No  Name of Therapist/Psychiatrist: N/A   Have You Been Recently Discharged From Any Office Practice or Programs? No  Explanation of Discharge From Practice/Program: N/A    CCA Screening Triage Referral Assessment Type of Contact: Face-to-Face  Telemedicine Service Delivery:   Is this Initial or Reassessment?   Date Telepsych consult ordered in [facility]:    Time Telepsych consult ordered in [facility]:    Location of Assessment: [facility]  Provider Location: [facility] Assessment Services   Collateral Involvement: Provider to contact patient's mother who is the pettioner   Does Patient Have a Automotive engineer Guardian? No  Legal Guardian Contact Information: N/A  Copy of Legal Guardianship Form: -- (N/A)  Legal Guardian Notified of Arrival: -- (N/A)  Legal Guardian Notified of Pending Discharge: --(N/A) If Minor and Not Living with Parent(s), Who has Custody? -- (N/A)  Is CPS involved or ever been involved? Never  Is APS involved or ever been involved? Never   Patient Determined To Be At Risk for Harm To Self or Others Based on Review of Patient Reported Information or Presenting Complaint? Yes, for Self-Harm  (Patient denies but in the petition it is reported that he made the statement that he wanted to blow is brains out)  Method: Plan without intent  Availability of Means: Has close by  Intent: Vague intent or NA  Notification Required: No need or identified person  Additional Information for Danger to Others Potential: -- (N/A)  Additional Comments for Danger to Others Potential: N/A  Are There Guns or Other Weapons in Your Home? Yes  Types of Guns/Weapons: Shotguns  Are These Weapons Safely Secured?                            Yes  Who Could Verify You Are Able To Have These Secured: Gilfriend  Do You Have any Outstanding Charges, Pending Court Dates, Parole/Probation? Denies  Contacted To Inform of Risk of Harm To Self or Others: Other: Comment (Pt denies intent to harm self or others)   Does Patient Present under Involuntary Commitment? Yes   [place] of Residence: [place]   Patient Currently Receiving the Following Services: Not Receiving Services  Determination of Need: Urgent (48 hours)   Options For Referral: Inpatient Hospitalization; Outpatient Therapy; Medication Management  CCA Biopsychosocial Patient Reported Schizophrenia/Schizoaffective Diagnosis in Past: No  Strengths: Trying to ween himself off alcohol   Mental Health Symptoms Depression:  Irritability   Duration of Depressive symptoms: Duration of Depressive Symptoms: Less than two weeks   Mania:  Irritability   Anxiety:   Irritability   Psychosis:  None   Duration of Psychotic symptoms:    Trauma:  None   Obsessions:  None   Compulsions:  None   Inattention:  None   Hyperactivity/Impulsivity:  None   Oppositional/Defiant Behaviors:  None   Emotional Irregularity:  None   Other Mood/Personality Symptoms:  N/A    Mental Status Exam Appearance and self-care  Stature:  Average   Weight:  Average weight   Clothing:  Casual   Grooming:  Normal   Cosmetic use:  None    Posture/gait:  Normal   Motor activity:  Not Remarkable   Sensorium  Attention:  Normal   Concentration:  Normal   Orientation:  Object; Person; Place; Situation   Recall/memory:  Defective in Recent   Affect and Mood  Affect:  Anxious   Mood:  Anxious   Relating  Eye contact:  Normal   Facial expression:  Anxious   Attitude toward examiner:  Cooperative   Thought and Language  Speech flow: Normal   Thought content:  Appropriate to Mood and Circumstances   Preoccupation:  None   Hallucinations:  None   Organization:  Intact   [employer] of Knowledge:  Fair   Intelligence:  Average   Abstraction:  Functional   Judgement:  Fair   Dance movement psychotherapist:  Adequate   Insight:  Fair   Decision Making:  Normal   Social Functioning  Social Maturity:  Responsible   Social Judgement:  Normal   Stress  Stressors:  Family conflict   Coping Ability:  Overwhelmed   Skill Deficits:  Interpersonal   Supports:  Friends/Service system     Religion: Religion/Spirituality Are You A Religious Person?: Yes What is Your Religious Affiliation?: None How Might This Affect Treatment?: N/A  Leisure/Recreation: Leisure / Recreation Do You Have Hobbies?: Yes Leisure and Hobbies: Working on cars  Exercise/Diet: Exercise/Diet Do You Exercise?: Yes What Type of Exercise Do You Do?: Run/Walk How Many Times a Week Do You Exercise?: 4-5 times a week Have You Gained or Lost A Significant Amount of Weight in the Past Six Months?: No Do You Follow a Special Diet?: Yes Type of Diet: no beek of pork due to alpha-gal syndrome Do You Have Any Trouble Sleeping?: No   CCA Employment/Education Employment/Work Situation: Employment / Work Situation Employment Situation: Leave of absence Patient's Job has Been Impacted by Current Illness: No Has Patient ever Been in Equities trader?: Yes (Describe in comment) Did You Receive Any 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: counseling  Education: Education Is Patient Currently Attending School?: No Last Grade Completed: 12 Did You Product manager?: No Did You Have Any Difficulty At [employer]?: No Patient's Education Has Been Impacted by Current Illness: No   CCA Family/Childhood History Family and Relationship History: Family history Does patient have children?: Yes How many children?: 2 How is patient's relationship with their children?: good relationship  Childhood History:  Childhood History By whom was/is the patient raised?: Both parents Did patient suffer any verbal/emotional/physical/sexual abuse as a child?:  No Did patient suffer from severe childhood neglect?: No Has patient ever been sexually abused/assaulted/raped as an adolescent or adult?: No Was the patient ever a victim of a crime or a disaster?: No Witnessed domestic violence?: No Has patient been affected by domestic violence as an adult?: No       CCA Substance Use Alcohol/Drug Use: Alcohol / Drug Use Pain Medications: See MAR Prescriptions: See MAR Over the Counter: See MAR History of alcohol / drug use?: Yes Longest period of sobriety (when/how long): 2 years Negative Consequences of Use: Personal relationships Withdrawal Symptoms: Tremors, Sweats Substance #1 Name of Substance 1: Alcohol 1 - Age of First Use: 20's 1 - Amount (size/oz): 1/2 of a  fifth 1 - Frequency: daily 1 - Duration: ongoing 1 - Last Use / Amount: Last  night 1 - Method of Aquiring: purchase 1- Route of Use: oral                       ASAM's:  Six Dimensions of Multidimensional Assessment  Dimension 1:  Acute Intoxication and/or Withdrawal Potential:   Dimension 1:  Description of individual's past and current experiences of substance use and withdrawal: Pt reports that he has quit in the past for at least 2 years in the past. He has sweats and tremors.   Dimension 2:  Biomedical Conditions and Complications:   Dimension 2:  Description of patient's biomedical conditions and  complications: Patient recently had hip replacement surgery and it has not  healed completely.  Dimension 3:  Emotional, Behavioral, or Cognitive Conditions and Complications:  Dimension 3:  Description of emotional, behavioral, or cognitive conditions and complications: Patient has beeen given a diagnosis of bipolar.  His family reprots that he has been saying that he wants to end it all.  He sent his girfrind a picture of a knife cutting wrist.  He told one of his sons that this was about to be his last day on earth.  Dimension 4:  Readiness to Change:  Dimension 4:  Description of Readiness to Change criteria: Patient's girlfriend recently told him that if he does not stop drinking she is going to leave the relationship.Patient reports that he has beeen trying to ween himself off the alcohol ever since.  Dimension 5:  Relapse, Continued use, or Continued Problem Potential:  Dimension 5:  Relapse, continued use, or continued problem potential critiera description: Patient  reprots incease in his alcohol consumption since his has been out on LOA due to his recent surgery.  Dimension 6:  Recovery/Living Environment:  Dimension 6:  Recovery/Iiving environment criteria description: Patient lives with his 2 sons ages 43 and 21  ASAM Severity Score: ASAM's Severity Rating Score: 9  ASAM Recommended Level of Treatment: ASAM Recommended Level of Treatment: Level II Intensive Outpatient Treatment   Substance use Disorder (SUD) Substance Use Disorder (SUD)  Checklist Symptoms of Substance Use: Continued use despite having a persistent/recurrent physical/psychological problem caused/exacerbated by use, Continued use despite persistent or recurrent social, interpersonal problems, caused or exacerbated by use, Evidence of withdrawal (Comment) (has tremors when he tries to cut back or  stop)  Recommendations for Services/Supports/Treatments: Recommendations for Services/Supports/Treatments Recommendations For Services/Supports/Treatments: Detox, Facility Based Crisis, Medication Management, Individual Therapy  Disposition Recommendation per psychiatric provider: We recommend inpatient psychiatric hospitalization after medical hospitalization. Patient has been involuntarily committed on 06/14/2023.    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   Elevated blood pressure reading without diagnosis of hypertension 03/22/2022   Arthritis 03/22/2022   Class 1 obesity without serious comorbidity with body mass index (BMI) of 34.0 to 34.9 in adult 03/22/2022   Tobacco abuse counseling 03/22/2022   Appendicitis 10/09/2018   Acute appendicitis    Perforated appendicitis 08/28/2018     Referrals to Alternative Service(s): Referred to Alternative Service(s):   Place:   Date:   Time:    Referred to Alternative Service(s):   Place:   Date:   Time:    Referred to Alternative Service(s):   Place:   Date:   Time:    Referred to Alternative Service(s):   Place:   Date:   Time:     Donnamae Jude, LCSW

## 2023-06-15 ENCOUNTER — Encounter: Payer: Self-pay | Admitting: Psychiatry

## 2023-06-15 ENCOUNTER — Other Ambulatory Visit: Payer: Self-pay

## 2023-06-15 ENCOUNTER — Inpatient Hospital Stay
Admission: AD | Admit: 2023-06-15 | Discharge: 2023-06-20 | DRG: 885 | Disposition: A | Discharge status: Home / Self Care | Payer: PRIVATE HEALTH INSURANCE | Source: Intra-hospital | Attending: Psychiatry | Admitting: Psychiatry

## 2023-06-15 DIAGNOSIS — F332 Major depressive disorder, recurrent severe without psychotic features: Secondary | ICD-10-CM | POA: Diagnosis present

## 2023-06-15 DIAGNOSIS — G8929 Other chronic pain: Secondary | ICD-10-CM | POA: Diagnosis present

## 2023-06-15 DIAGNOSIS — Z91018 Allergy to other foods: Secondary | ICD-10-CM

## 2023-06-15 DIAGNOSIS — Z91199 Patient's noncompliance with other medical treatment and regimen due to unspecified reason: Secondary | ICD-10-CM | POA: Diagnosis not present

## 2023-06-15 DIAGNOSIS — Z5982 Transportation insecurity: Secondary | ICD-10-CM

## 2023-06-15 DIAGNOSIS — Z79899 Other long term (current) drug therapy: Secondary | ICD-10-CM

## 2023-06-15 DIAGNOSIS — F17210 Nicotine dependence, cigarettes, uncomplicated: Secondary | ICD-10-CM | POA: Diagnosis present

## 2023-06-15 DIAGNOSIS — F319 Bipolar disorder, unspecified: Secondary | ICD-10-CM | POA: Diagnosis not present

## 2023-06-15 DIAGNOSIS — F302 Manic episode, severe with psychotic symptoms: Principal | ICD-10-CM | POA: Insufficient documentation

## 2023-06-15 DIAGNOSIS — I10 Essential (primary) hypertension: Secondary | ICD-10-CM | POA: Diagnosis present

## 2023-06-15 DIAGNOSIS — Z5986 Financial insecurity: Secondary | ICD-10-CM | POA: Diagnosis not present

## 2023-06-15 DIAGNOSIS — F419 Anxiety disorder, unspecified: Secondary | ICD-10-CM | POA: Diagnosis present

## 2023-06-15 DIAGNOSIS — R45851 Suicidal ideations: Secondary | ICD-10-CM | POA: Diagnosis present

## 2023-06-15 DIAGNOSIS — F10239 Alcohol dependence with withdrawal, unspecified: Secondary | ICD-10-CM | POA: Diagnosis present

## 2023-06-15 DIAGNOSIS — Z5941 Food insecurity: Secondary | ICD-10-CM

## 2023-06-15 DIAGNOSIS — Z881 Allergy status to other antibiotic agents status: Secondary | ICD-10-CM

## 2023-06-15 DIAGNOSIS — F3130 Bipolar disorder, current episode depressed, mild or moderate severity, unspecified: Secondary | ICD-10-CM | POA: Diagnosis not present

## 2023-06-15 DIAGNOSIS — Z9049 Acquired absence of other specified parts of digestive tract: Secondary | ICD-10-CM

## 2023-06-15 DIAGNOSIS — Z888 Allergy status to other drugs, medicaments and biological substances status: Secondary | ICD-10-CM | POA: Diagnosis not present

## 2023-06-15 DIAGNOSIS — Z96641 Presence of right artificial hip joint: Secondary | ICD-10-CM | POA: Diagnosis present

## 2023-06-15 DIAGNOSIS — Z91014 Allergy to mammalian meats: Secondary | ICD-10-CM

## 2023-06-15 DIAGNOSIS — F1994 Other psychoactive substance use, unspecified with psychoactive substance-induced mood disorder: Secondary | ICD-10-CM | POA: Insufficient documentation

## 2023-06-15 DIAGNOSIS — F1020 Alcohol dependence, uncomplicated: Secondary | ICD-10-CM | POA: Diagnosis present

## 2023-06-15 MED ORDER — LORAZEPAM 1 MG PO TABS
1.0000 mg | ORAL_TABLET | Freq: Four times a day (QID) | ORAL | Status: AC
  Administered 2023-06-15 – 2023-06-16 (×5): 1 mg via ORAL
  Filled 2023-06-15 (×5): qty 1

## 2023-06-15 MED ORDER — ACETAMINOPHEN 325 MG PO TABS: 650.0000 mg | ORAL_TABLET | Freq: Four times a day (QID) | ORAL | Status: DC | PRN

## 2023-06-15 MED ORDER — ALUM & MAG HYDROXIDE-SIMETH 200-200-20 MG/5ML PO SUSP: 30.0000 mL | ORAL | Status: DC | PRN

## 2023-06-15 MED ORDER — ARIPIPRAZOLE 2 MG PO TABS
2.0000 mg | ORAL_TABLET | Freq: Every day | ORAL | Status: DC
Start: 2023-06-15 — End: 2023-06-18
  Administered 2023-06-15 – 2023-06-18 (×4): 2 mg via ORAL
  Filled 2023-06-15 (×4): qty 1

## 2023-06-15 MED ORDER — ONDANSETRON 4 MG PO TBDP: 4.0000 mg | ORAL_TABLET | Freq: Four times a day (QID) | ORAL | Status: AC | PRN

## 2023-06-15 MED ORDER — SERTRALINE HCL 25 MG PO TABS: 50.0000 mg | ORAL_TABLET | Freq: Every day | ORAL | Status: DC

## 2023-06-15 MED ORDER — MAGNESIUM HYDROXIDE 400 MG/5ML PO SUSP: 30.0000 mL | Freq: Every day | ORAL | Status: DC | PRN

## 2023-06-15 MED ORDER — LORAZEPAM 1 MG PO TABS
1.0000 mg | ORAL_TABLET | Freq: Every day | ORAL | Status: AC
  Administered 2023-06-19: 1 mg via ORAL
  Filled 2023-06-15: qty 1

## 2023-06-15 MED ORDER — ADULT MULTIVITAMIN W/MINERALS CH
1.0000 | ORAL_TABLET | Freq: Every day | ORAL | Status: DC
  Administered 2023-06-16 – 2023-06-20 (×5): 1 via ORAL
  Filled 2023-06-15 (×5): qty 1

## 2023-06-15 MED ORDER — DIPHENHYDRAMINE HCL 25 MG PO CAPS: 50.0000 mg | ORAL_CAPSULE | Freq: Three times a day (TID) | ORAL | Status: DC | PRN

## 2023-06-15 MED ORDER — TRAZODONE HCL 50 MG PO TABS
50.0000 mg | ORAL_TABLET | Freq: Every evening | ORAL | Status: DC | PRN
  Administered 2023-06-15 – 2023-06-19 (×5): 50 mg via ORAL
  Filled 2023-06-15 (×5): qty 1

## 2023-06-15 MED ORDER — THIAMINE MONONITRATE 100 MG PO TABS
100.0000 mg | ORAL_TABLET | Freq: Every day | ORAL | Status: DC
  Administered 2023-06-16 – 2023-06-20 (×5): 100 mg via ORAL
  Filled 2023-06-15 (×5): qty 1

## 2023-06-15 MED ORDER — LORAZEPAM 1 MG PO TABS
1.0000 mg | ORAL_TABLET | Freq: Two times a day (BID) | ORAL | Status: AC
  Administered 2023-06-18 (×2): 1 mg via ORAL
  Filled 2023-06-15 (×2): qty 1

## 2023-06-15 MED ORDER — HYDROXYZINE HCL 25 MG PO TABS: 25.0000 mg | ORAL_TABLET | Freq: Three times a day (TID) | ORAL | Status: DC | PRN

## 2023-06-15 MED ORDER — LORAZEPAM 1 MG PO TABS: 1.0000 mg | ORAL_TABLET | Freq: Four times a day (QID) | ORAL | Status: AC | PRN

## 2023-06-15 MED ORDER — MELATONIN 5 MG PO TABS
5.0000 mg | ORAL_TABLET | Freq: Every day | ORAL | Status: DC
  Administered 2023-06-15 – 2023-06-19 (×5): 5 mg via ORAL
  Filled 2023-06-15 (×5): qty 1

## 2023-06-15 MED ORDER — TOPIRAMATE 25 MG PO TABS
50.0000 mg | ORAL_TABLET | Freq: Every day | ORAL | Status: DC
  Administered 2023-06-15 – 2023-06-20 (×6): 50 mg via ORAL
  Filled 2023-06-15 (×6): qty 2

## 2023-06-15 MED ORDER — HYDROXYZINE HCL 25 MG PO TABS
25.0000 mg | ORAL_TABLET | Freq: Four times a day (QID) | ORAL | Status: AC | PRN
  Administered 2023-06-15 – 2023-06-18 (×3): 25 mg via ORAL
  Filled 2023-06-15 (×3): qty 1

## 2023-06-15 MED ORDER — HALOPERIDOL 5 MG PO TABS: 5.0000 mg | ORAL_TABLET | Freq: Three times a day (TID) | ORAL | Status: DC | PRN

## 2023-06-15 MED ORDER — DULOXETINE HCL 20 MG PO CPEP
20.0000 mg | ORAL_CAPSULE | Freq: Every day | ORAL | Status: DC
  Administered 2023-06-15 – 2023-06-17 (×3): 20 mg via ORAL
  Filled 2023-06-15 (×3): qty 1

## 2023-06-15 MED ORDER — OLANZAPINE 10 MG IM SOLR: 10.0000 mg | Freq: Three times a day (TID) | INTRAMUSCULAR | Status: DC | PRN

## 2023-06-15 MED ORDER — LORAZEPAM 1 MG PO TABS
1.0000 mg | ORAL_TABLET | Freq: Three times a day (TID) | ORAL | Status: AC
  Administered 2023-06-17 (×3): 1 mg via ORAL
  Filled 2023-06-15 (×3): qty 1

## 2023-06-15 MED ORDER — ACETAMINOPHEN 325 MG PO TABS
650.0000 mg | ORAL_TABLET | Freq: Four times a day (QID) | ORAL | Status: DC | PRN
  Administered 2023-06-16 – 2023-06-18 (×2): 650 mg via ORAL
  Filled 2023-06-15 (×2): qty 2

## 2023-06-15 MED ORDER — LOPERAMIDE HCL 2 MG PO CAPS: 2.0000 mg | ORAL_CAPSULE | ORAL | Status: AC | PRN

## 2023-06-15 NOTE — ED Notes (Signed)
 Patient discharged in stable condition with all belongings to [facility] via [employer] sheriff. Patient denies SI, HI, AVH.

## 2023-06-15 NOTE — Progress Notes (Signed)
   06/15/23 1700  Charting Type  Charting Type Initial  Safety Check Verification  Has the RN verified the 15 minute safety check completion? Yes  Neurological  Neuro (WDL) WDL  HEENT  HEENT (WDL) X  Teeth Missing (Comment);Poor dental hygiene (missing multiple teeth)  Tongue Pink;Moist  Mucous Membrane(s) Moist;Pink  Voice Clear  Respiratory  Respiratory (WDL) WDL  Cardiac  Cardiac (WDL) X (hx. HTN)  Vascular  Vascular (WDL) WDL  Integumentary  Integumentary (WDL) X  Staff Member Assisting with Skin Assessment on Admission Ladean Raya, RN  Skin Color Appropriate for ethnicity  Skin Condition Dry;Flaky  Skin Integrity Intact;Erythema/redness;Other (Comment) (surgical scar to R hip; a few tattoos over body)  Erythema/Redness Location Abdomen;Back;Arm;Buttocks;Chest;Face;Leg  Erythema/Redness Location Orientation Right;Left;Lower;Upper;Bilateral  Skin Turgor Non-tenting  Braden Scale (Ages 8 and up)  Sensory Perceptions 4  Moisture 4  Activity 4  Mobility 3  Nutrition 3  Friction and Shear 3  Braden Scale Score 21  Musculoskeletal  Musculoskeletal (WDL) X  Assistive Device None  Generalized Weakness Yes  Weight Bearing Restrictions Per Provider Order No  Musculoskeletal Details  Right Hip Surgery (hip replaced)  Gastrointestinal  Gastrointestinal (WDL) WDL  Last BM Date  06/15/23  GU Assessment  Genitourinary (WDL) WDL  Genitalia  Male Genitalia Intact  Neurological  Level of Consciousness Alert

## 2023-06-15 NOTE — Progress Notes (Signed)
   06/15/23 1800  CIWA-Ar  Nausea and Vomiting 0  Tactile Disturbances 0  Tremor 0  Auditory Disturbances 0  Paroxysmal Sweats 0  Visual Disturbances 0  Anxiety 4  Headache, Fullness in Head 0  Agitation 0  Orientation and Clouding of Sensorium 0  CIWA-Ar Total 4

## 2023-06-15 NOTE — ED Notes (Signed)
 Patient observed/assessed in bed/chair resting quietly appearing in no distress and verbalizing no complaints at this time. Will continue to monitor.

## 2023-06-15 NOTE — Plan of Care (Signed)
  Problem: Education: Goal: Knowledge of [facility] Education information/materials will improve Outcome: Not Progressing Goal: Emotional status will improve Outcome: Not Progressing Goal: Mental status will improve Outcome: Not Progressing Goal: Verbalization of understanding the information provided will improve Outcome: Not Progressing   Problem: Activity: Goal: Interest or engagement in activities will improve Outcome: Not Progressing Goal: Sleeping patterns will improve Outcome: Not Progressing   Problem: Coping: Goal: Ability to verbalize frustrations and anger appropriately will improve Outcome: Not Progressing Goal: Ability to demonstrate self-control will improve Outcome: Not Progressing   Problem: Health Behavior/Discharge Planning: Goal: Identification of resources available to assist in meeting health care needs will improve Outcome: Not Progressing Goal: Compliance with treatment plan for underlying cause of condition will improve Outcome: Not Progressing   Problem: Physical Regulation: Goal: Ability to maintain clinical measurements within normal limits will improve Outcome: Not Progressing   Problem: Safety: Goal: Periods of time without injury will increase Outcome: Not Progressing   Problem: Education: Goal: Understanding of discharge needs will improve Outcome: Not Progressing   Problem: Health Behavior/Discharge Planning: Goal: Ability to identify changes in lifestyle to reduce recurrence of condition will improve Outcome: Not Progressing Goal: Identification of resources available to assist in meeting health care needs will improve Outcome: Not Progressing   Problem: Physical Regulation: Goal: Complications related to the disease process, condition or treatment will be avoided or minimized Outcome: Not Progressing   Problem: Safety: Goal: Ability to remain free from injury will improve Outcome: Not Progressing   Problem:  Coping: Goal: Coping ability will improve Outcome: Not Progressing   Problem: Self-Concept: Goal: Ability to disclose and discuss suicidal ideas will improve Outcome: Not Progressing   Problem: Self-Concept: Goal: Ability to identify factors that promote anxiety will improve Outcome: Not Progressing Goal: Level of anxiety will decrease Outcome: Not Progressing Goal: Ability to modify response to factors that promote anxiety will improve Outcome: Not Progressing

## 2023-06-15 NOTE — ED Notes (Signed)
 Patient A&O x4, calm and cooperative. Patient appears to be in deep thought. Patient questioning his placement and appears apprehensive about going to another facility. Patient informed he will be going to [facility] which seemed to alleviate patients apprehension. Patient states the hospital is close to his and his mothers home. Patient admits to ETOH use and making a "stupid statement" appears to have remorse. Patient denies SI, HI, AVH. Patient does not appear to be responding to internal stimuli.

## 2023-06-15 NOTE — Group Note (Signed)
 Date:  06/15/2023 Time:  10:46 AM  Group Topic/Focus:  Making Healthy Choices:   The focus of this group is to help patients identify negative/unhealthy choices they were using prior to admission and identify positive/healthier coping strategies to replace them upon discharge. Self Care:   The focus of this group is to help patients understand the importance of self-care in order to improve or restore emotional, physical, spiritual, interpersonal, and financial health.    Participation Level:  Did Not Attend   Vernon Allen 06/15/2023, 10:46 AM

## 2023-06-15 NOTE — H&P (Signed)
 Psychiatric Admission Assessment Adult  Patient Identification: Vernon Allen MRN:  [id number] Date of Evaluation:  06/15/2023 Chief Complaint:  Major depressive disorder, recurrent severe without psychotic features (HCC) [F33.2] MDD (major depressive disorder), recurrent episode, severe (HCC) [F33.2] Principal Diagnosis: Bipolar I disorder, most recent episode depressed (HCC) Diagnosis:  Principal Problem:   Bipolar I disorder, most recent episode depressed (HCC) Active Problems:   EtOH dependence (HCC)   Major depressive disorder, recurrent severe without psychotic features (HCC)   Suicide ideation   Substance induced mood disorder (HCC)  History of Present Illness: 43 year old Caucasian male currently IVC with a long-standing history of alcohol dependence and multiple prior admissions for detoxification. He presents under involuntary commitment after law enforcement transported him following an incident in which his mother, deeply concerned about his escalating alcohol abuse and recent suicidal remarks, petitioned for his admission. The patient's account is inconsistent; at one point he claims to have quit drinking two weeks ago, yet later admits to consuming approximately three beers daily with his last drink occurring last night. He also reports that his son assisted him in attempting to taper his alcohol intake by administering "two shots in the morning" with plans to go cold turkey--a narrative that later becomes contradictory.In addition to his substance use issues, the patient has a history of depressive symptoms and a previous diagnosis of bipolar disorder, although he has not been compliant with prescribed mental health medications (the only documented medication being Zoloft from past records). During the current encounter, he is hyperverbal and expresses anger toward his mother, alleging that she is trying to hurt him, though he fails to clarify her intentions. He also comments on  her disapproval of his new girlfriend while paradoxically noting that his girlfriend is helping him curb his alcohol use.The patient has recently made explicit suicidal statements. He reported to his son last night that "Tonight I feel like a bad person and I want to end it," and he has voiced a desire to "blow his brains out." Collateral information from his mother corroborates these statements, noting that he previously sent a picture to his ex-girlfriend showing him with a knife to his wrist, and that he recently threatened to use a gun to end his life--with her confirming that he has access to a weapon.The patient urine drug screen was positive for benzodiazepines, suggesting recent use not initially disclosed. He is currently on a CIWA protocol with Ativan for alcohol withdrawal management. The patient also experiences chronic pain. His overall presentation is marked by significant mood dysregulation, inconsistent self-reporting, and high-risk suicidal behavior and supports a diagnosis most consistent with major depressive disorder versus a substance-induced mood disorder, compounded by his chronic alcohol abuse. Given the recent suicidal gestures and expressed intent, the patient is considered at high acute risk for self-harm, warranting a minimum 48-hour period of close inpatient observation and stabilization. Associated Signs/Symptoms: Depression Symptoms:  insomnia, fatigue, feelings of worthlessness/guilt, difficulty concentrating, hopelessness, impaired memory, suicidal thoughts without plan, anxiety, decreased appetite, (Hypo) Manic Symptoms:  Elevated Mood, Impulsivity, Irritable Mood, Anxiety Symptoms:   none reported Psychotic Symptoms:   none reported PTSD Symptoms: Negative Total Time spent with patient: 2.5 hours  Past Psychiatric History: MDD  Is the patient at risk to self? Yes.    Has the patient been a risk to self in the past 6 months? Yes.    Has the patient been a  risk to self within the distant past? Yes.    Is the patient a  risk to others? No.  Has the patient been a risk to others in the past 6 months? No.  Has the patient been a risk to others within the distant past? No.   [place] Scale:  Flowsheet Row ED from 06/14/2023 in [facility] Admission (Discharged) from 04/11/2023 in [facility] ORTHOPEDICS (1A) ED from 11/23/2022 in [facility] Emergency Department at [facility]  C-SSRS RISK CATEGORY No Risk No Risk No Risk        Prior Inpatient Therapy: Yes.   If yes, describe 13 years ago  Prior Outpatient Therapy: No. If yes, describe none    Alcohol Screening:   Substance Abuse History in the last 12 months:  Yes.   Consequences of Substance Abuse: Family Consequences:  threatening suicide Previous Psychotropic Medications: No  Psychological Evaluations: No  Past Medical History:  Past Medical History:  Diagnosis Date   Alcohol abuse    Allergy to alpha-gal    Anxiety    Avascular necrosis of bone of right hip (HCC) 02/2023   Bipolar 1 disorder (HCC)    Elevated blood pressure reading with diagnosis of hypertension    Perforated appendicitis 10/09/2018   Perirectal abscess 05/19/2021    Past Surgical History:  Procedure Laterality Date   BACK SURGERY  2008   L4-5 ruptured disc   INCISION AND DRAINAGE PERIRECTAL ABSCESS Left 05/19/2021   LAPAROSCOPIC APPENDECTOMY N/A 10/09/2018   Procedure: APPENDECTOMY LAPAROSCOPIC ATTEMPTED;  Surgeon: Leafy Ro, MD;  Location: [facility] ORS;  Service: General;  Laterality: N/A;   PARTIAL COLECTOMY Right 10/09/2018   Procedure: PARTIAL COLECTOMY;  Surgeon: Leafy Ro, MD;  Location: [facility] ORS;  Service: General;  Laterality: Right;   TOTAL HIP ARTHROPLASTY Right 04/11/2023   Procedure: TOTAL HIP ARTHROPLASTY;  Surgeon: Christena Flake, MD;  Location: [facility] ORS;  Service: Orthopedics;  Laterality: Right;   Family History:  Family History   Adopted: Yes   Family Psychiatric  History: none reported Tobacco Screening:  Social History   Tobacco Use  Smoking Status Some Days   Current packs/day: 0.25   Types: Cigarettes  Smokeless Tobacco Never    BH Tobacco Counseling     Are you interested in Tobacco Cessation Medications?  N/A, patient does not use tobacco products Counseled patient on smoking cessation:  N/A, patient does not use tobacco products Reason Tobacco Screening Not Completed: No value filed.       Social History:  Social History   Substance and Sexual Activity  Alcohol Use Yes   Alcohol/week: 12.0 standard drinks of alcohol   Types: 12 Cans of beer per week     Social History   Substance and Sexual Activity  Drug Use Yes   Types: Marijuana   Comment: occassional    Additional Social History:                           Allergies:   Allergies  Allergen Reactions   Beef-Derived Drug Products Anaphylaxis   Keflex [Cephalexin] Anaphylaxis   Mobic [Meloxicam] Anaphylaxis   Pork-Derived Products Anaphylaxis   Bc Fast Pain Relief [Aspirin-Salicylamide-Caffeine] Hives   Ciprofloxacin Hives   Esomeprazole Magnesium Hives   Lab Results:  Results for orders placed or performed during the hospital encounter of 06/14/23 (from the past 48 hours)  CBC with Differential/Platelet     Status: Abnormal   Collection Time: 06/14/23  3:09 PM  Result Value Ref Range  WBC 3.8 (L) 4.0 - 10.5 K/uL   RBC 4.06 (L) 4.22 - 5.81 MIL/uL   Hemoglobin 13.4 13.0 - 17.0 g/dL   HCT 16.1 09.6 - 04.5 %   MCV 96.3 80.0 - 100.0 fL   MCH 33.0 26.0 - 34.0 pg   MCHC 34.3 30.0 - 36.0 g/dL   RDW 40.9 81.1 - 91.4 %   Platelets 235 150 - 400 K/uL   nRBC 0.0 0.0 - 0.2 %   Neutrophils Relative % 35 %   Neutro Abs 1.3 (L) 1.7 - 7.7 K/uL   Lymphocytes Relative 47 %   Lymphs Abs 1.8 0.7 - 4.0 K/uL   Monocytes Relative 13 %   Monocytes Absolute 0.5 0.1 - 1.0 K/uL   Eosinophils Relative 2 %   Eosinophils Absolute  0.1 0.0 - 0.5 K/uL   Basophils Relative 2 %   Basophils Absolute 0.1 0.0 - 0.1 K/uL   Immature Granulocytes 1 %   Abs Immature Granulocytes 0.02 0.00 - 0.07 K/uL    Comment: Performed at [facility], [address], [place], [place] [address]  Comprehensive metabolic panel     Status: Abnormal   Collection Time: 06/14/23  3:09 PM  Result Value Ref Range   Sodium 144 135 - 145 mmol/L   Potassium 3.4 (L) 3.5 - 5.1 mmol/L   Chloride 105 98 - 111 mmol/L   CO2 25 22 - 32 mmol/L   Glucose, Bld 93 70 - 99 mg/dL    Comment: Glucose reference range applies only to samples taken after fasting for at least 8 hours.   BUN 9 6 - 20 mg/dL   Creatinine, Ser 6.21 0.61 - 1.24 mg/dL   Calcium 9.0 8.9 - 30.8 mg/dL   Total Protein 6.8 6.5 - 8.1 g/dL   Albumin 3.8 3.5 - 5.0 g/dL   AST 657 (H) 15 - 41 U/L   ALT 32 0 - 44 U/L   Alkaline Phosphatase 148 (H) 38 - 126 U/L   Total Bilirubin 0.7 0.0 - 1.2 mg/dL   GFR, Estimated >84 >69 mL/min    Comment: (NOTE) Calculated using the CKD-EPI Creatinine Equation (2021)    Anion gap 14 5 - 15    Comment: Performed at [facility], [address] [place], [place] [address]  Hemoglobin A1c     Status: None   Collection Time: 06/14/23  3:09 PM  Result Value Ref Range   Hgb A1c MFr Bld 4.8 4.8 - 5.6 %    Comment: (NOTE) Pre diabetes:          5.7%-6.4%  Diabetes:              >6.4%  Glycemic control for   <7.0% adults with diabetes    Mean Plasma Glucose 91.06 mg/dL    Comment: Performed at [facility], [address], [place], [place] [address]  Lipid panel     Status: Abnormal   Collection Time: 06/14/23  3:09 PM  Result Value Ref Range   Cholesterol 218 (H) 0 - 200 mg/dL   Triglycerides 440 (H) <150 mg/dL   HDL 79 >10 mg/dL   Total CHOL/HDL Ratio 2.8 RATIO   VLDL UNABLE TO CALCULATE IF TRIGLYCERIDE OVER 400 mg/dL 0 - 40 mg/dL   LDL Cholesterol UNABLE TO CALCULATE IF TRIGLYCERIDE OVER 400 mg/dL 0 - 99 mg/dL    Comment:         Total Cholesterol/HDL:CHD Risk Coronary Heart Disease Risk  Table                     Men   Women  1/2 Average Risk   3.4   3.3  Average Risk       5.0   4.4  2 X Average Risk   9.6   7.1  3 X Average Risk  23.4   11.0        Use the calculated Patient Ratio above and the CHD Risk Table to determine the patient's CHD Risk.        ATP III CLASSIFICATION (LDL):  <100     mg/dL   Optimal  440-347  mg/dL   Near or Above                    Optimal  130-159  mg/dL   Borderline  425-956  mg/dL   High  >387     mg/dL   Very High Performed at [facility], [address], [place], [place] [address]   TSH     Status: None   Collection Time: 06/14/23  3:09 PM  Result Value Ref Range   TSH 1.115 0.350 - 4.500 uIU/mL    Comment: Performed by a 3rd Generation assay with a functional sensitivity of <=0.01 uIU/mL. Performed at [facility], [address] [place], [place] [address]   Ethanol     Status: Abnormal   Collection Time: 06/14/23  3:09 PM  Result Value Ref Range   Alcohol, Ethyl (B) 82 (H) <10 mg/dL    Comment: (NOTE) Lowest detectable limit for serum alcohol is 10 mg/dL.  For medical purposes only. Performed at [facility], [address] [place], [place] [address]   LDL cholesterol, direct     Status: None   Collection Time: 06/14/23  3:09 PM  Result Value Ref Range   Direct LDL 87 0 - 99 mg/dL    Comment: Performed at [facility], [address] [place], [place] [address]  POCT Urine Drug Screen - (I-Screen)     Status: Abnormal   Collection Time: 06/14/23  3:22 PM  Result Value Ref Range   POC Amphetamine UR None Detected NONE DETECTED (Cut Off Level 1000 ng/mL)   POC Secobarbital (BAR) None Detected NONE DETECTED (Cut Off Level 300 ng/mL)   POC Buprenorphine (BUP) None Detected NONE DETECTED (Cut Off Level 10 ng/mL)   POC Oxazepam (BZO) Positive (A) NONE DETECTED (Cut Off Level 300 ng/mL)   POC Cocaine UR None Detected NONE DETECTED (Cut  Off Level 300 ng/mL)   POC Methamphetamine UR None Detected NONE DETECTED (Cut Off Level 1000 ng/mL)   POC Morphine None Detected NONE DETECTED (Cut Off Level 300 ng/mL)   POC Methadone UR None Detected NONE DETECTED (Cut Off Level 300 ng/mL)   POC Oxycodone UR None Detected NONE DETECTED (Cut Off Level 100 ng/mL)   POC Marijuana UR None Detected NONE DETECTED (Cut Off Level 50 ng/mL)  CBG monitoring     Status: Normal   Collection Time: 06/14/23  3:24 PM  Result Value Ref Range   Glucose-Capillary 96   Urinalysis, Routine w reflex microscopic -Urine, Clean Catch     Status: Abnormal   Collection Time: 06/14/23  3:27 PM  Result Value Ref Range   Color, Urine AMBER (A) YELLOW    Comment: BIOCHEMICALS MAY BE AFFECTED BY COLOR   APPearance TURBID (A) CLEAR  Specific Gravity, Urine 1.026 1.005 - 1.030   pH 5.0 5.0 - 8.0   Glucose, UA NEGATIVE NEGATIVE mg/dL   Hgb urine dipstick NEGATIVE NEGATIVE   Bilirubin Urine NEGATIVE NEGATIVE   Ketones, ur NEGATIVE NEGATIVE mg/dL   Protein, ur NEGATIVE NEGATIVE mg/dL   Nitrite NEGATIVE NEGATIVE   Leukocytes,Ua NEGATIVE NEGATIVE   RBC / HPF 0-5 0 - 5 RBC/hpf   WBC, UA 0-5 0 - 5 WBC/hpf   Bacteria, UA RARE (A) NONE SEEN   Squamous Epithelial / HPF 0-5 0 - 5 /HPF   Mucus PRESENT    Amorphous Crystal PRESENT     Comment: Performed at [facility], [address] [place], [place] [address]    Blood Alcohol level:  Lab Results  Component Value Date   ETH 82 (H) 06/14/2023    Metabolic Disorder Labs:  Lab Results  Component Value Date   HGBA1C 4.8 06/14/2023   MPG 91.06 06/14/2023   No results found for: "PROLACTIN" Lab Results  Component Value Date   CHOL 218 (H) 06/14/2023   TRIG 596 (H) 06/14/2023   HDL 79 06/14/2023   CHOLHDL 2.8 06/14/2023   VLDL UNABLE TO CALCULATE IF TRIGLYCERIDE OVER 400 mg/dL 91/47/8295   LDLCALC UNABLE TO CALCULATE IF TRIGLYCERIDE OVER 400 mg/dL 62/13/0865    Current Medications: Current  Facility-Administered Medications  Medication Dose Route Frequency Provider Last Rate Last Admin   acetaminophen (TYLENOL) tablet 650 mg  650 mg Oral Q6H PRN Oneta Rack, NP       alum & mag hydroxide-simeth (MAALOX/MYLANTA) 200-200-20 MG/5ML suspension 30 mL  30 mL Oral Q4H PRN Starleen Blue, NP       ARIPiprazole (ABILIFY) tablet 2 mg  2 mg Oral Daily Myriam Forehand, NP       DULoxetine (CYMBALTA) DR capsule 20 mg  20 mg Oral Daily Myriam Forehand, NP       haloperidol (HALDOL) tablet 5 mg  5 mg Oral TID PRN Oneta Rack, NP       hydrOXYzine (ATARAX) tablet 25 mg  25 mg Oral Q6H PRN Myriam Forehand, NP       loperamide (IMODIUM) capsule 2-4 mg  2-4 mg Oral PRN Myriam Forehand, NP       LORazepam (ATIVAN) tablet 1 mg  1 mg Oral Q6H PRN Myriam Forehand, NP       LORazepam (ATIVAN) tablet 1 mg  1 mg Oral QID Myriam Forehand, NP   1 mg at 06/15/23 1652   Followed by   Melene Muller ON 06/17/2023] LORazepam (ATIVAN) tablet 1 mg  1 mg Oral TID Myriam Forehand, NP       Followed by   Melene Muller ON 06/18/2023] LORazepam (ATIVAN) tablet 1 mg  1 mg Oral BID Myriam Forehand, NP       Followed by   Melene Muller ON 06/19/2023] LORazepam (ATIVAN) tablet 1 mg  1 mg Oral Daily Myriam Forehand, NP       magnesium hydroxide (MILK OF MAGNESIA) suspension 30 mL  30 mL Oral Daily PRN Oneta Rack, NP       melatonin tablet 5 mg  5 mg Oral QHS Myriam Forehand, NP       multivitamin with minerals tablet 1 tablet  1 tablet Oral Daily Myriam Forehand, NP       OLANZapine (ZYPREXA) injection 10 mg  10 mg Intramuscular TID PRN Starleen Blue, NP  ondansetron (ZOFRAN-ODT) disintegrating tablet 4 mg  4 mg Oral Q6H PRN Myriam Forehand, NP       Melene Muller ON 06/16/2023] thiamine (VITAMIN B1) tablet 100 mg  100 mg Oral Daily Myriam Forehand, NP       topiramate (TOPAMAX) tablet 50 mg  50 mg Oral Daily Myriam Forehand, NP       traZODone (DESYREL) tablet 50 mg  50 mg Oral QHS PRN Oneta Rack, NP       PTA Medications: Medications Prior to Admission   Medication Sig Dispense Refill Last Dose/Taking   ondansetron (ZOFRAN) 4 MG tablet Take 1 tablet (4 mg total) by mouth every 6 (six) hours as needed for nausea. (Patient not taking: Reported on 06/14/2023) 30 tablet 0    oxyCODONE (OXY IR/ROXICODONE) 5 MG immediate release tablet Take 1-2 tablets (5-10 mg total) by mouth every 4 (four) hours as needed for moderate pain (pain score 4-6). (Patient taking differently: Take 5 mg by mouth every 12 (twelve) hours as needed for moderate pain (pain score 4-6).) 40 tablet 0    Pseudoeph-Doxylamine-DM-APAP (NYQUIL PO) Take 30 mLs by mouth every 6 (six) hours as needed (For cold symptoms).       Musculoskeletal: Strength & Muscle Tone: within normal limits Gait & Station: normal Patient leans: N/A            Psychiatric Specialty Exam:  Presentation  General Appearance:  Disheveled (body odor)  Eye Contact: Good  Speech: Clear and Coherent; Pressured (Minimally interactive, guarded, and inconsistent in reporting)  Speech Volume: Normal  Handedness: Right   Mood and Affect  Mood: Dysphoric; Depressed; Anxious  Affect: Flat   Thought Process  Thought Processes: Irrevelant (flight of ideasHe displays inconsistencies in his self-report, often shifting details about his alcohol consumption.)  Duration of Psychotic Symptoms: Depressive symptoms 3 months Past Diagnosis of Schizophrenia or Psychoactive disorder: No  Descriptions of Associations:Circumstantial  Orientation:Full (Time, Place and Person)  Thought Content:Scattered (externalizes blame, particularly regarding his mother's role in his circumstances)  Hallucinations:Hallucinations: None  Ideas of Reference:None  Suicidal Thoughts:Suicidal Thoughts: No  Homicidal Thoughts:Homicidal Thoughts: No   Sensorium  Memory: Immediate Fair; Remote Good; Recent Good  Judgment: Poor (as evidenced by his recent suicidal gesture, ongoing substance misuse, and  refusal to adhere to recommended treatment protocol)  Insight: Poor (he minimizes his issues and remains ambivalent about treatment for both his depression and alcohol use disorder)   Executive Functions  Concentration: Poor  Attention Span: Poor  Recall: Fair  Fund of Knowledge: Good  Language: Good   Psychomotor Activity  Psychomotor Activity: Psychomotor Activity: Normal   Assets  Assets: Communication Skills; Social Support; Health and safety inspector; Housing   Sleep  Sleep: Sleep: Poor Number of Hours of Sleep: 3    Physical Exam: Physical Exam Vitals and nursing note reviewed.  HENT:     Head: Normocephalic and atraumatic.     Nose: Nose normal.  Pulmonary:     Effort: Pulmonary effort is normal.  Musculoskeletal:        General: Normal range of motion.     Cervical back: Normal range of motion.  Neurological:     General: No focal deficit present.     Mental Status: He is oriented to person, place, and time. Mental status is at baseline.  Psychiatric:        Attention and Perception: Attention and perception normal.        Mood and Affect: Mood is  anxious and depressed. Affect is flat.        Speech: Speech normal.        Behavior: Behavior is hyperactive. Behavior is cooperative.        Cognition and Memory: Cognition and memory normal.        Judgment: Judgment is impulsive.    Review of Systems  Musculoskeletal:  Positive for joint pain.       Right hip pain  Psychiatric/Behavioral:  Positive for depression, substance abuse and suicidal ideas. The patient is nervous/anxious.   All other systems reviewed and are negative.  Blood pressure (!) 141/98, pulse 76, temperature 97.6 F (36.4 C), temperature source Oral, resp. rate 18, height 5\' 8"  (1.727 m), weight 103.4 kg, SpO2 97%. Body mass index is 34.67 kg/m.  Treatment Plan Summary: Daily contact with patient to assess and evaluate symptoms and progress in treatment and Medication  management Abilify 2 mg daily for mood stabilization. CIWA protocol with Ativan as needed for alcohol withdrawal management. Topiramate 50 mg daily to support substance use recovery and assist with mood stabilization. Cymbalta 20 mg daily initiated specifically to address chronic pain and depression Close observation of mood, behavior, and suicidal ideation with daily risk assessments. Maintain strict suicide precautions throughout the inpatient stay Lipid panel and hemoglobin A1c to monitor metabolic parameters given his medication regimen and risk factors. Observation Level/Precautions:  Continuous Observation Detox 15 minute checks Seizure  Laboratory:  HbAIC LIPIDS  Psychotherapy:    Medications:    Consultations:    Discharge Concerns:    Estimated LOS:  Other:     Physician Treatment Plan for Primary Diagnosis: Bipolar I disorder, most recent episode depressed (HCC) Long Term Goal(s): Improvement in symptoms so as ready for discharge  Short Term Goals: Ability to identify changes in lifestyle to reduce recurrence of condition will improve, Ability to verbalize feelings will improve, Ability to disclose and discuss suicidal ideas, Ability to demonstrate self-control will improve, Ability to identify and develop effective coping behaviors will improve, Ability to maintain clinical measurements within normal limits will improve, Compliance with prescribed medications will improve, and Ability to identify triggers associated with substance abuse/mental health issues will improve  Physician Treatment Plan for Secondary Diagnosis: Principal Problem:   Bipolar I disorder, most recent episode depressed (HCC) Active Problems:   EtOH dependence (HCC)   Major depressive disorder, recurrent severe without psychotic features (HCC)   Suicide ideation   Substance induced mood disorder (HCC)  Long Term Goal(s): Improvement in symptoms so as ready for discharge  Short Term Goals: Ability to  identify changes in lifestyle to reduce recurrence of condition will improve, Ability to verbalize feelings will improve, Ability to disclose and discuss suicidal ideas, Ability to demonstrate self-control will improve, Ability to identify and develop effective coping behaviors will improve, Ability to maintain clinical measurements within normal limits will improve, Compliance with prescribed medications will improve, and Ability to identify triggers associated with substance abuse/mental health issues will improve  I certify that inpatient services furnished can reasonably be expected to improve the patient's condition.    Myriam Forehand, NP 2/22/20255:52 PM

## 2023-06-15 NOTE — Progress Notes (Signed)
   06/15/23 1700  Psych Admission Type (Psych Patients Only)  Admission Status Involuntary  Psychosocial Assessment  Patient Complaints None  Eye Contact Fair;Watchful  Facial Expression Anxious;Worried  Affect Anxious;Preoccupied (patient preoccupied with how long he'll be here and if his moter can bring him clothes and his son to visit.)  Speech Logical/coherent  Interaction Assertive  Motor Activity Slow  Appearance/Hygiene Unremarkable  Behavior Characteristics Cooperative;Appropriate to situation (Patient states his goals are "honestly, to get back out there with my son, I want to be better for my boys".)  Mood Anxious;Preoccupied;Pleasant  Aggressive Behavior  Effect No apparent injury  Thought Process  Coherency Circumstantial  Content Blaming others (patient blames Mom for his being here.)  Delusions None reported or observed  Perception WDL  Hallucination None reported or observed  Judgment WDL  Confusion None  Danger to Self  Current suicidal ideation? Denies  Agreement Not to Harm Self Yes  Description of Agreement Verbal  Danger to Others  Danger to Others None reported or observed

## 2023-06-15 NOTE — Group Note (Signed)
 Date:  06/15/2023 Time:  9:41 PM  Group Topic/Focus:  Managing Feelings:   The focus of this group is to identify what feelings patients have difficulty handling and develop a plan to handle them in a healthier way upon discharge.    Participation Level:  Active  Participation Quality:  Appropriate  Affect:  Appropriate  Cognitive:  Appropriate  Insight: Appropriate  Engagement in Group:  Engaged  Modes of Intervention:  Discussion and Education  Additional Comments:  Patient presented within normal limits. Patient was a supportive member of the group. All paperwork was completed.  Lorenda Ishihara 06/15/2023, 9:41 PM

## 2023-06-15 NOTE — ED Notes (Signed)
 Writer called on several occasions to hand off report for patient. On last try, communicated with Judie Petit who informed Clinical research associate that the nurses stated they would call writer back in 5-10 minutes. The writer informed Judie Petit that the sheriff is expected to pick up patient by 0915.

## 2023-06-15 NOTE — Tx Team (Signed)
 Initial Treatment Plan 06/15/2023 6:00 PM Vernon Allen ZOX:[id number]    PATIENT STRESSORS: Marital or family conflict   Substance abuse     PATIENT STRENGTHS: Ability for insight  Forensic psychologist fund of knowledge  Supportive family/friends    PATIENT IDENTIFIED PROBLEMS: Alcohol abuse  Altercation with Mother  Patient states that a stressor is him "being here"                 DISCHARGE CRITERIA:  Ability to meet basic life and health needs Improved stabilization in mood, thinking, and/or behavior Medical problems require only outpatient monitoring Need for constant or close observation no longer present Reduction of life-threatening or endangering symptoms to within safe limits Withdrawal symptoms are absent or subacute and managed without 24-hour nursing intervention  PRELIMINARY DISCHARGE PLAN: Outpatient therapy Return to previous living arrangement  PATIENT/FAMILY INVOLVEMENT: This treatment plan has been presented to and reviewed with the patient, Vernon Allen. The patient has been given the opportunity to ask questions and make suggestions.  Satomi Buda, RN 06/15/2023, 6:00 PM

## 2023-06-15 NOTE — ED Provider Notes (Signed)
 FBC/OBS ASAP Discharge Summary  Date and Time: 06/15/2023 8:23 AM  Name: Vernon Allen  MRN:  [id number]   Discharge Diagnoses:  Final diagnoses:  Alcohol-induced mood disorder [facility])    Patient was accepted to [facility]  -Per Admission assessment note: " Patient is a 43 yo Caucasian male brought in by law enforcement after his mother involuntarily petitioned due to increased alcohol abuse and suicidal remarks. Patient was picked up by law enforcement and transported to this UC for an assessment. -Per IVC +RESPONDENT HAS BEEN PREVIOUSLY DIAGNOSED WITH BIPOLAR DISORDER, HE IS NOT CURRENTLY TAKING ANY MEDICATION FOR HIS MENTAL HEALTH ISSUE. HE HAS HISTORY OF MENTAL COMMITMENTS MANY YEARS AGO. HE HAS BEEN MAKING SUICIDAL IDEATIONS, LAST NIGHT HE CALLED HIS SON AND STATED THAT "TONIGHT I FEEL LIKE A BAD PERSON AND I WANT TO END IT" HE TOLD HIS MOTHER HE WANTED TO BLOW HIS BRAINS OUT. FAMILY STATES THAT HE HAS A WEAPON IN THE HOUSE. RESPONDENT IS OUT ON MEDICAL LEAVE FROM A SURGERY AND IS DRINKING EXCESSIVELY AND TAKING PAIN PILLS. FAMILY IS CONCERNED FOR HIS WELL BEING AS HE CONTINUES TO REGRESS WHILE NOT TENDING TO HIS MENTAL HEALTH AND ABUSING ALCOHOL AND PAIN PILLS."   Total Time spent with patient:  0 See HPI  Tobacco Cessation:  N/A, patient does not currently use tobacco products  Current Medications:  Current Facility-Administered Medications  Medication Dose Route Frequency Provider Last Rate Last Admin   acetaminophen (TYLENOL) tablet 650 mg  650 mg Oral Q6H PRN Starleen Blue, NP   650 mg at 06/15/23 0823   alum & mag hydroxide-simeth (MAALOX/MYLANTA) 200-200-20 MG/5ML suspension 30 mL  30 mL Oral Q4H PRN Starleen Blue, NP       haloperidol (HALDOL) tablet 5 mg  5 mg Oral TID PRN Starleen Blue, NP       And   diphenhydrAMINE (BENADRYL) capsule 50 mg  50 mg Oral TID PRN Starleen Blue, NP       haloperidol (HALDOL) tablet 5 mg  5 mg Oral TID PRN Starleen Blue, NP       And    diphenhydrAMINE (BENADRYL) capsule 50 mg  50 mg Oral TID PRN Starleen Blue, NP       haloperidol lactate (HALDOL) injection 5 mg  5 mg Intramuscular TID PRN Starleen Blue, NP       And   diphenhydrAMINE (BENADRYL) injection 50 mg  50 mg Intramuscular TID PRN Starleen Blue, NP       And   LORazepam (ATIVAN) injection 2 mg  2 mg Intramuscular TID PRN Starleen Blue, NP       haloperidol lactate (HALDOL) injection 5 mg  5 mg Intramuscular TID PRN Starleen Blue, NP       And   diphenhydrAMINE (BENADRYL) injection 50 mg  50 mg Intramuscular TID PRN Starleen Blue, NP       And   LORazepam (ATIVAN) injection 2 mg  2 mg Intramuscular TID PRN Starleen Blue, NP       haloperidol lactate (HALDOL) injection 10 mg  10 mg Intramuscular TID PRN Starleen Blue, NP       And   diphenhydrAMINE (BENADRYL) injection 50 mg  50 mg Intramuscular TID PRN Starleen Blue, NP       And   LORazepam (ATIVAN) injection 2 mg  2 mg Intramuscular TID PRN Starleen Blue, NP       hydrOXYzine (ATARAX) tablet 25 mg  25 mg Oral TID PRN Starleen Blue, NP  loperamide (IMODIUM) capsule 2-4 mg  2-4 mg Oral PRN Starleen Blue, NP       LORazepam (ATIVAN) tablet 1 mg  1 mg Oral QID Starleen Blue, NP   1 mg at 06/15/23 2951   Followed by   Melene Muller ON 06/16/2023] LORazepam (ATIVAN) tablet 1 mg  1 mg Oral TID Starleen Blue, NP       Followed by   Melene Muller ON 06/17/2023] LORazepam (ATIVAN) tablet 1 mg  1 mg Oral BID Starleen Blue, NP       Followed by   Melene Muller ON 06/18/2023] LORazepam (ATIVAN) tablet 1 mg  1 mg Oral Daily Nkwenti, Doris, NP       magnesium hydroxide (MILK OF MAGNESIA) suspension 30 mL  30 mL Oral Daily PRN Starleen Blue, NP       multivitamin with minerals tablet 1 tablet  1 tablet Oral Daily Starleen Blue, NP   1 tablet at 06/15/23 8841   OLANZapine (ZYPREXA) injection 10 mg  10 mg Intramuscular TID PRN Starleen Blue, NP       ondansetron (ZOFRAN-ODT) disintegrating tablet 4 mg  4 mg Oral Q6H PRN Starleen Blue, NP       oxyCODONE (Oxy IR/ROXICODONE) immediate release tablet 5 mg  5 mg Oral Q12H PRN Starleen Blue, NP       thiamine (VITAMIN B1) tablet 100 mg  100 mg Oral Daily Starleen Blue, NP   100 mg at 06/15/23 6606   traZODone (DESYREL) tablet 50 mg  50 mg Oral QHS PRN Starleen Blue, NP       Current Outpatient Medications  Medication Sig Dispense Refill   oxyCODONE (OXY IR/ROXICODONE) 5 MG immediate release tablet Take 1-2 tablets (5-10 mg total) by mouth every 4 (four) hours as needed for moderate pain (pain score 4-6). (Patient taking differently: Take 5 mg by mouth every 12 (twelve) hours as needed for moderate pain (pain score 4-6).) 40 tablet 0   Pseudoeph-Doxylamine-DM-APAP (NYQUIL PO) Take 30 mLs by mouth every 6 (six) hours as needed (For cold symptoms).     ondansetron (ZOFRAN) 4 MG tablet Take 1 tablet (4 mg total) by mouth every 6 (six) hours as needed for nausea. (Patient not taking: Reported on 06/14/2023) 30 tablet 0    PTA Medications:  Facility Ordered Medications  Medication   acetaminophen (TYLENOL) tablet 650 mg   alum & mag hydroxide-simeth (MAALOX/MYLANTA) 200-200-20 MG/5ML suspension 30 mL   magnesium hydroxide (MILK OF MAGNESIA) suspension 30 mL   haloperidol (HALDOL) tablet 5 mg   And   diphenhydrAMINE (BENADRYL) capsule 50 mg   OLANZapine (ZYPREXA) injection 10 mg   haloperidol lactate (HALDOL) injection 5 mg   And   diphenhydrAMINE (BENADRYL) injection 50 mg   And   LORazepam (ATIVAN) injection 2 mg   hydrOXYzine (ATARAX) tablet 25 mg   traZODone (DESYREL) tablet 50 mg   haloperidol (HALDOL) tablet 5 mg   And   diphenhydrAMINE (BENADRYL) capsule 50 mg   haloperidol lactate (HALDOL) injection 5 mg   And   diphenhydrAMINE (BENADRYL) injection 50 mg   And   LORazepam (ATIVAN) injection 2 mg   haloperidol lactate (HALDOL) injection 10 mg   And   diphenhydrAMINE (BENADRYL) injection 50 mg   And   LORazepam (ATIVAN) injection 2 mg   oxyCODONE (Oxy  IR/ROXICODONE) immediate release tablet 5 mg   [COMPLETED] thiamine (VITAMIN B1) injection 100 mg   thiamine (VITAMIN B1) tablet 100 mg   multivitamin with minerals tablet  1 tablet   loperamide (IMODIUM) capsule 2-4 mg   ondansetron (ZOFRAN-ODT) disintegrating tablet 4 mg   LORazepam (ATIVAN) tablet 1 mg   Followed by   Melene Muller ON 06/16/2023] LORazepam (ATIVAN) tablet 1 mg   Followed by   Melene Muller ON 06/17/2023] LORazepam (ATIVAN) tablet 1 mg   Followed by   Melene Muller ON 06/18/2023] LORazepam (ATIVAN) tablet 1 mg   PTA Medications  Medication Sig   Pseudoeph-Doxylamine-DM-APAP (NYQUIL PO) Take 30 mLs by mouth every 6 (six) hours as needed (For cold symptoms).   oxyCODONE (OXY IR/ROXICODONE) 5 MG immediate release tablet Take 1-2 tablets (5-10 mg total) by mouth every 4 (four) hours as needed for moderate pain (pain score 4-6). (Patient taking differently: Take 5 mg by mouth every 12 (twelve) hours as needed for moderate pain (pain score 4-6).)   ondansetron (ZOFRAN) 4 MG tablet Take 1 tablet (4 mg total) by mouth every 6 (six) hours as needed for nausea. (Patient not taking: Reported on 06/14/2023)       06/14/2023    3:21 PM 03/02/2022    2:07 PM  Depression screen PHQ 2/9  Decreased Interest 0 0  Down, Depressed, Hopeless 0 0  PHQ - 2 Score 0 0    Flowsheet Row ED from 06/14/2023 in [facility] Admission (Discharged) from [facility]/11/2023 in [facility] ([facility] ED from 11/23/2022 in [facility] at [facility]  C-SSRS RISK CATEGORY No Risk No Risk No Risk       Musculoskeletal  Strength & Muscle Tone: within normal limits Gait & Station: normal Patient leans: N/A  Psychiatric Specialty Exam  Presentation  General Appearance:  Appropriate for Environment; Fairly Groomed  Eye Contact: Fair  Speech: Clear and Coherent  Speech Volume: Normal  Handedness: Right   Mood and Affect   Mood: Depressed; Anxious  Affect: Congruent   Thought Process  Thought Processes: Coherent  Descriptions of Associations:Intact  Orientation:Full (Time, Place and Person)  Thought Content:WDL  Diagnosis of Schizophrenia or Schizoaffective disorder in past: No    Hallucinations:Hallucinations: None  Ideas of Reference:None  Suicidal Thoughts:Suicidal Thoughts: No  Homicidal Thoughts:Homicidal Thoughts: No   Sensorium  Memory: Immediate Fair  Judgment: Poor  Insight: Poor   Executive Functions  Concentration: Fair  Attention Span: Fair  Recall: Fair  Fund of Knowledge: Fair  Language: Fair   Psychomotor Activity  Psychomotor Activity: Psychomotor Activity: Normal   Assets  Assets: Desire for Improvement   Sleep  Sleep: Sleep: Fair   No data recorded  Physical Exam  Physical Exam ROS Blood pressure (!) 141/94, pulse 77, temperature 98.3 F (36.8 C), temperature source Oral, resp. rate 16, SpO2 100%. There is no height or weight on file to calculate BMI.  Demographic Factors:  Male  Loss Factors: NA  Historical Factors: NA  Risk Reduction Factors:   NA  Continued Clinical Symptoms:  Depression:   Aggression  Cognitive Features That Contribute To Risk:  Closed-mindedness    Suicide Risk:  Moderate:  Frequent suicidal ideation with limited intensity, and duration, some specificity in terms of plans, no associated intent, good self-control, limited dysphoria/symptomatology, some risk factors present, and identifiable protective factors, including available and accessible social support.  Plan Of Care/Follow-up recommendations:  Activity:  as tolerated Diet:  heart healthy   Disposition: Patient to be transferred to [facility]. Discharging to [facility] department   Oneta Rack, NP 06/15/2023, 8:23 AM

## 2023-06-15 NOTE — [facility] Suicide Risk Assessment (Signed)
 [facility] Admission Suicide Risk Assessment   Nursing information obtained from:    Demographic factors:    Current Mental Status:    Loss Factors:    Historical Factors:    Risk Reduction Factors:     Total Time spent with patient: 2 hours Principal Problem: Bipolar I disorder, most recent episode depressed (HCC) Diagnosis:  Principal Problem:   Bipolar I disorder, most recent episode depressed (HCC) Active Problems:   EtOH dependence (HCC)   Major depressive disorder, recurrent severe without psychotic features (HCC)   Suicide ideation   Substance induced mood disorder (HCC)  Subjective Data:  43 year old Caucasian male presenting in a disheveled, anxious, and irritable state. He appears dysphoric and minimizes his symptoms, providing inconsistent details regarding his alcohol use. According to his report, "mother put me in here," and he was brought in by law enforcement after his mother involuntarily petitioned for his admission due to increased alcohol abuse and recent suicidal remarks. He disclosed making multiple suicidal statements--most notably, last night he called his son saying, "Tonight I feel like a bad person and I want to end it," and he expressed a desire to "blow his brains out." The patient provides conflicting information about his alcohol consumption: at one point, he stated he quit drinking two weeks ago; later, he mentioned reducing his intake to three beers daily, with his last drink consumed last night. He also described a tapering attempt orchestrated by his son, using "two shots in the morning" with plans to go "cold [place]." The patient denies overt hallucinations, paranoia, or delusional thinking during the interview, though previous dpcumentation collateral information from his mother Todrick Siedschlag, [phone]) indicates a history of aggressive behavior and threats of violence. His past medical history includes a previous diagnosis of bipolar disorder (though he is not  currently medicated) and multiple admissions for alcohol dependence and detoxification. His chart reveals a past prescription for Zoloft. Additionally, he is on medical leave following hip surgery and has been prescribed oxycodone ("oxy 5mg " requested, with a recent prescription for oxycodone dated June 03, 2023, for 7 days). His recent alcohol level was reported at 82, and there is no recent benzodiazepine prescription per PDMP review.. Notably, his urine drug screen (UDS) was positive for benzodiazepines, suggesting recent benzo use that was not previously documented.  Continued Clinical Symptoms:    The "Alcohol Use Disorders Identification Test", Guidelines for Use in Primary Care, Second Edition.  World Science writer [facility]). Score between 0-7:  no or low risk or alcohol related problems. Score between 8-15:  moderate risk of alcohol related problems. Score between 16-19:  high risk of alcohol related problems. Score 20 or above:  warrants further diagnostic evaluation for alcohol dependence and treatment.   CLINICAL FACTORS:   Bipolar Disorder:   Depressive phase Depression:   Comorbid alcohol abuse/dependence Impulsivity Insomnia Alcohol/Substance Abuse/Dependencies Chronic Pain More than one psychiatric diagnosis Medical Diagnoses and Treatments/Surgeries   Musculoskeletal: Strength & Muscle Tone: within normal limits Gait & Station: normal Patient leans: N/A  Psychiatric Specialty Exam:  Presentation  General Appearance:  Disheveled (body odor)  Eye Contact: Good  Speech: Clear and Coherent; Pressured (Minimally interactive, guarded, and inconsistent in reporting)  Speech Volume: Normal  Handedness: Right   Mood and Affect  Mood: Dysphoric; Depressed; Anxious  Affect: Flat   Thought Process  Thought Processes: Irrevelant (flight of ideasHe displays inconsistencies in his self-report, often shifting details about his alcohol  consumption.)  Descriptions of Associations:Circumstantial  Orientation:Full (Time, Place and  Person)  Thought Content:Scattered (externalizes blame, particularly regarding his mother's role in his circumstances)  History of Schizophrenia/Schizoaffective disorder:No  Duration of Psychotic Symptoms:N/A  Hallucinations:Hallucinations: None  Ideas of Reference:None  Suicidal Thoughts:Suicidal Thoughts: No  Homicidal Thoughts:Homicidal Thoughts: No   Sensorium  Memory: Immediate Fair; Remote Good; Recent Good  Judgment: Poor (as evidenced by his recent suicidal gesture, ongoing substance misuse, and refusal to adhere to recommended treatment protocol)  Insight: Poor (he minimizes his issues and remains ambivalent about treatment for both his depression and alcohol use disorder)   Executive Functions  Concentration: Poor  Attention Span: Poor  Recall: Fair  Fund of Knowledge: Good  Language: Good   Psychomotor Activity  Psychomotor Activity: Psychomotor Activity: Normal   Assets  Assets: Communication Skills; Social Support; Health and safety inspector; Housing   Sleep  Sleep: Sleep: Poor Number of Hours of Sleep: 3    Physical Exam: Physical Exam Vitals and nursing note reviewed.  Constitutional:      Appearance: Normal appearance.  HENT:     Head: Normocephalic and atraumatic.     Nose: Nose normal.  Pulmonary:     Effort: Pulmonary effort is normal.  Musculoskeletal:        General: Normal range of motion.     Cervical back: Normal range of motion.  Neurological:     General: No focal deficit present.     Mental Status: He is alert and oriented to person, place, and time. Mental status is at baseline.  Psychiatric:        Attention and Perception: Attention and perception normal.        Mood and Affect: Mood is anxious and depressed. Affect is flat.        Speech: Speech normal.        Behavior: Behavior is hyperactive. Behavior is  cooperative.        Thought Content: Thought content normal.        Cognition and Memory: Cognition and memory normal.        Judgment: Judgment is impulsive.    Review of Systems  Musculoskeletal:  Positive for joint pain.       Right hip  Psychiatric/Behavioral:  Positive for depression and substance abuse. The patient is nervous/anxious and has insomnia.   All other systems reviewed and are negative.  Blood pressure (!) 141/98, pulse 76, temperature 97.6 F (36.4 C), temperature source Oral, resp. rate 18, height 5\' 8"  (1.727 m), weight 103.4 kg, SpO2 97%. Body mass index is 34.67 kg/m.   COGNITIVE FEATURES THAT CONTRIBUTE TO RISK:  None    SUICIDE RISK:   Mild:  Suicidal ideation of limited frequency, intensity, duration, and specificity.  There are no identifiable plans, no associated intent, mild dysphoria and related symptoms, good self-control (both objective and subjective assessment), few other risk factors, and identifiable protective factors, including available and accessible social support.  PLAN OF CARE:  Abilify 2 mg daily for mood stabilization. CIWA protocol with Ativan as needed for alcohol withdrawal management. Topiramate 50 mg daily to support substance use recovery and assist with mood stabilization. Cymbalta 20 mg daily initiated specifically to address chronic pain and depression Close observation of mood, behavior, and suicidal ideation with daily risk assessments. Maintain strict suicide precautions throughout the inpatient stay Lipid panel and hemoglobin A1c to monitor metabolic parameters given his medication regimen and risk factors.  I certify that inpatient services furnished can reasonably be expected to improve the patient's condition.   Myriam Forehand,  NP 06/15/2023, 5:48 PM

## 2023-06-15 NOTE — Discharge Instructions (Signed)
 Accepted to [facility]

## 2023-06-16 DIAGNOSIS — F302 Manic episode, severe with psychotic symptoms: Secondary | ICD-10-CM | POA: Diagnosis not present

## 2023-06-16 LAB — LIPID PANEL
Cholesterol: 232 mg/dL — ABNORMAL HIGH (ref 0–200)
HDL: 75 mg/dL (ref >40)
LDL Cholesterol: 110 mg/dL — ABNORMAL HIGH (ref 0–99)
Total CHOL/HDL Ratio: 3.1 {ratio}
Triglycerides: 235 mg/dL — ABNORMAL HIGH (ref <150)
VLDL: 47 mg/dL — ABNORMAL HIGH (ref 0–40)

## 2023-06-16 LAB — HEMOGLOBIN A1C
Hgb A1c MFr Bld: 4.6 % — ABNORMAL LOW (ref 4.8–5.6)
Mean Plasma Glucose: 85.32 mg/dL

## 2023-06-16 NOTE — Plan of Care (Signed)
 Patient stated that he couldn't sleep good last night that's why he feels tired. Patient rated his depression 0/10 and anxiety 2/10. Patient asked for PRN anxiety medicine for " just being here." Denies SI,HI and AVH. Appetite and energy level good. Support and encouragement given.

## 2023-06-16 NOTE — Progress Notes (Signed)
 Nursing Shift Note:  1900-0700  Attended Evening Group: yes Medication Compliant:  yes Behavior: cooperative Sleep Quality: good Significant Changes: none   06/16/23 0400  Psych Admission Type (Psych Patients Only)  Admission Status Involuntary  Psychosocial Assessment  Patient Complaints None  Eye Contact Fair  Facial Expression Anxious  Affect Anxious  Speech Logical/coherent  Interaction Assertive  Motor Activity Slow  Appearance/Hygiene Unremarkable  Behavior Characteristics Cooperative  Mood Anxious;Preoccupied  Thought Process  Coherency WDL  Content WDL  Delusions None reported or observed  Perception WDL  Hallucination None reported or observed  Judgment WDL  Confusion None  Danger to Self  Current suicidal ideation? Denies  Danger to Others  Danger to Others None reported or observed

## 2023-06-16 NOTE — [facility] Suicide Risk Assessment (Signed)
 [facility] INPATIENT:  Family/Significant Other Suicide Prevention Education  Suicide Prevention Education:  Contact Attempts: Saamir Armstrong, mother, [phone],  has been identified by the patient as the family member/significant other with whom the patient will be residing, and identified as the person(s) who will aid the patient in the event of a mental health crisis.  With written consent from the patient, two attempts were made to provide suicide prevention education, prior to and/or following the patient's discharge.  We were unsuccessful in providing suicide prevention education.  A suicide education pamphlet was given to the patient to share with family/significant other.  Date and time of first attempt:06/16/2023 at 4:00 pm Date and time of second attempt: second attempt needed.  Marshell Levan 06/16/2023, 3:59 PM

## 2023-06-16 NOTE — Group Note (Signed)
 Date:  06/16/2023 Time:  4:40 PM  Group Topic/Focus:  Activity Group: The focus of the group is to promote activity for the patients and to encourage patients to go outside to the courtyard and get some fresh air and some exercise.    Participation Level:  Active  Participation Quality:  Appropriate  Affect:  Appropriate  Cognitive:  Appropriate  Insight: Appropriate  Engagement in Group:  Engaged  Modes of Intervention:  Activity  Additional Comments:    Mary Sella Kenny Rea 06/16/2023, 4:40 PM

## 2023-06-16 NOTE — Progress Notes (Signed)
 [facility] MD Progress Note  06/16/2023 7:05 PM Vernon Allen  MRN:  [id number] Subjective:  43 year old Caucasian male currently IVC who expressed, "You started me on a mood stabilizer, will I go home with it?" and "I want to keep taking it." He appears engaged in his treatment and is inquiring about the continuation of his medication regimen upon discharge.The patient demonstrates a good understanding of his current treatment plan after being educated about the mood stabilizer. He is motivated to continue the medication as part of his ongoing management and expresses a clear desire to have it included in his discharge plan. This reflects positive engagement and adherence toward his treatment goals. Principal Problem: Bipolar I disorder, single manic episode, severe, with psychosis (HCC) Diagnosis: Principal Problem:   Bipolar I disorder, single manic episode, severe, with psychosis (HCC) Active Problems:   EtOH dependence (HCC)   Suicide ideation   Substance induced mood disorder (HCC)  Total Time spent with patient: 1 hour  Past Psychiatric History: see below  Past Medical History:  Past Medical History:  Diagnosis Date   Alcohol abuse    Allergy to alpha-gal    Anxiety    Avascular necrosis of bone of right hip (HCC) 02/2023   Bipolar 1 disorder (HCC)    Elevated blood pressure reading with diagnosis of hypertension    Perforated appendicitis 10/09/2018   Perirectal abscess 05/19/2021    Past Surgical History:  Procedure Laterality Date   BACK SURGERY  2008   L4-5 ruptured disc   INCISION AND DRAINAGE PERIRECTAL ABSCESS Left 05/19/2021   LAPAROSCOPIC APPENDECTOMY N/A 10/09/2018   Procedure: APPENDECTOMY LAPAROSCOPIC ATTEMPTED;  Surgeon: Leafy Ro, MD;  Location: [facility] ORS;  Service: General;  Laterality: N/A;   PARTIAL COLECTOMY Right 10/09/2018   Procedure: PARTIAL COLECTOMY;  Surgeon: Leafy Ro, MD;  Location: [facility] ORS;  Service: General;  Laterality: Right;   TOTAL  HIP ARTHROPLASTY Right 04/11/2023   Procedure: TOTAL HIP ARTHROPLASTY;  Surgeon: Christena Flake, MD;  Location: [facility] ORS;  Service: Orthopedics;  Laterality: Right;   Family History:  Family History  Adopted: Yes   Family Psychiatric  History: none reported Social History:  Social History   Substance and Sexual Activity  Alcohol Use Yes   Alcohol/week: 12.0 standard drinks of alcohol   Types: 12 Cans of beer per week     Social History   Substance and Sexual Activity  Drug Use Yes   Types: Marijuana   Comment: occassional    Social History   Socioeconomic History   Marital status: Divorced    Spouse name: Not on file   Number of children: 2   Years of education: Not on file   Highest education level: Not on file  Occupational History   Not on file  Tobacco Use   Smoking status: Some Days    Current packs/day: 0.25    Types: Cigarettes   Smokeless tobacco: Never  Vaping Use   Vaping status: Never Used  Substance and Sexual Activity   Alcohol use: Yes    Alcohol/week: 12.0 standard drinks of alcohol    Types: 12 Cans of beer per week   Drug use: Yes    Types: Marijuana    Comment: occassional   Sexual activity: Not on file  Other Topics Concern   Not on file  Social History Narrative   Lives with sons and fiance   Social Drivers of Health   Financial Resource Strain: Medium Risk (05/24/2023)  Received from [facility]   Overall Financial Resource Strain (CARDIA)    Difficulty of Paying Living Expenses: Somewhat hard  Food Insecurity: No Food Insecurity (06/15/2023)   Hunger Vital Sign    Worried About Running Out of Food in the Last Year: Never true    Ran Out of Food in the Last Year: Never true  Recent Concern: Food Insecurity - Food Insecurity Present (05/24/2023)   Received from [facility]   Hunger Vital Sign    Worried About Running Out of Food in the Last Year: Never true    Ran Out of Food in the Last Year:  Often true  Transportation Needs: Unknown (06/15/2023)   PRAPARE - Transportation    Lack of Transportation (Medical): Patient unable to answer    Lack of Transportation (Non-Medical): No  Recent Concern: Transportation Needs - Unmet Transportation Needs (05/24/2023)   Received from [facility] - Transportation    In the past 12 months, has lack of transportation kept you from medical appointments or from getting medications?: Yes    Lack of Transportation (Non-Medical): Yes  Physical Activity: Not on file  Stress: Not on file  Social Connections: Not on file   Additional Social History:                         Sleep: Fair  Appetite:  Good  Current Medications: Current Facility-Administered Medications  Medication Dose Route Frequency Provider Last Rate Last Admin   acetaminophen (TYLENOL) tablet 650 mg  650 mg Oral Q6H PRN Oneta Rack, NP   650 mg at 06/16/23 0825   alum & mag hydroxide-simeth (MAALOX/MYLANTA) 200-200-20 MG/5ML suspension 30 mL  30 mL Oral Q4H PRN Starleen Blue, NP       ARIPiprazole (ABILIFY) tablet 2 mg  2 mg Oral Daily Myriam Forehand, NP   2 mg at 06/16/23 0825   DULoxetine (CYMBALTA) DR capsule 20 mg  20 mg Oral Daily Myriam Forehand, NP   20 mg at 06/16/23 0825   haloperidol (HALDOL) tablet 5 mg  5 mg Oral TID PRN Oneta Rack, NP       hydrOXYzine (ATARAX) tablet 25 mg  25 mg Oral Q6H PRN Myriam Forehand, NP   25 mg at 06/16/23 1653   loperamide (IMODIUM) capsule 2-4 mg  2-4 mg Oral PRN Myriam Forehand, NP       LORazepam (ATIVAN) tablet 1 mg  1 mg Oral Q6H PRN Myriam Forehand, NP       LORazepam (ATIVAN) tablet 1 mg  1 mg Oral QID Myriam Forehand, NP   1 mg at 06/16/23 1259   Followed by   Melene Muller ON 06/17/2023] LORazepam (ATIVAN) tablet 1 mg  1 mg Oral TID Myriam Forehand, NP       Followed by   Melene Muller ON 06/18/2023] LORazepam (ATIVAN) tablet 1 mg  1 mg Oral BID Myriam Forehand, NP       Followed by   Melene Muller ON 06/19/2023]  LORazepam (ATIVAN) tablet 1 mg  1 mg Oral Daily Myriam Forehand, NP       magnesium hydroxide (MILK OF MAGNESIA) suspension 30 mL  30 mL Oral Daily PRN Oneta Rack, NP       melatonin tablet 5 mg  5 mg Oral QHS Myriam Forehand, NP   5 mg at 06/15/23 2121  multivitamin with minerals tablet 1 tablet  1 tablet Oral Daily Myriam Forehand, NP   1 tablet at 06/16/23 0824   OLANZapine (ZYPREXA) injection 10 mg  10 mg Intramuscular TID PRN Starleen Blue, NP       ondansetron (ZOFRAN-ODT) disintegrating tablet 4 mg  4 mg Oral Q6H PRN Myriam Forehand, NP       thiamine (VITAMIN B1) tablet 100 mg  100 mg Oral Daily Myriam Forehand, NP   100 mg at 06/16/23 1610   topiramate (TOPAMAX) tablet 50 mg  50 mg Oral Daily Myriam Forehand, NP   50 mg at 06/16/23 9604   traZODone (DESYREL) tablet 50 mg  50 mg Oral QHS PRN Oneta Rack, NP   50 mg at 06/15/23 2120    Lab Results:  Results for orders placed or performed during the hospital encounter of 06/15/23 (from the past 48 hours)  Lipid panel     Status: Abnormal   Collection Time: 06/16/23  8:37 AM  Result Value Ref Range   Cholesterol 232 (H) 0 - 200 mg/dL   Triglycerides 540 (H) <150 mg/dL   HDL 75 >98 mg/dL   Total CHOL/HDL Ratio 3.1 RATIO   VLDL 47 (H) 0 - 40 mg/dL   LDL Cholesterol 119 (H) 0 - 99 mg/dL    Comment:        Total Cholesterol/HDL:CHD Risk Coronary Heart Disease Risk Table                     Men   Women  1/2 Average Risk   3.4   3.3  Average Risk       5.0   4.4  2 X Average Risk   9.6   7.1  3 X Average Risk  23.4   11.0        Use the calculated Patient Ratio above and the CHD Risk Table to determine the patient's CHD Risk.        ATP III CLASSIFICATION (LDL):  <100     mg/dL   Optimal  147-829  mg/dL   Near or Above                    Optimal  130-159  mg/dL   Borderline  562-130  mg/dL   High  >865     mg/dL   Very High Performed at [facility], [address] [place], [place] [address]   Hemoglobin A1c      Status: Abnormal   Collection Time: 06/16/23  8:37 AM  Result Value Ref Range   Hgb A1c MFr Bld 4.6 (L) 4.8 - 5.6 %    Comment: (NOTE) Pre diabetes:          5.7%-6.4%  Diabetes:              >6.4%  Glycemic control for   <7.0% adults with diabetes    Mean Plasma Glucose 85.32 mg/dL    Comment: Performed at [facility], [address] [place], [place] [address]    Blood Alcohol level:  Lab Results  Component Value Date   ETH 82 (H) 06/14/2023    Metabolic Disorder Labs: Lab Results  Component Value Date   HGBA1C 4.6 (L) 06/16/2023   MPG 85.32 06/16/2023   MPG 91.06 06/14/2023   No results found for: "PROLACTIN" Lab Results  Component Value Date   CHOL 232 (H) 06/16/2023   TRIG 235 (  H) 06/16/2023   HDL 75 06/16/2023   CHOLHDL 3.1 06/16/2023   VLDL 47 (H) 06/16/2023   LDLCALC 110 (H) 06/16/2023   LDLCALC UNABLE TO CALCULATE IF TRIGLYCERIDE OVER 400 mg/dL 29/56/2130    Physical Findings: AIMS:  , ,  ,  ,    CIWA:  CIWA-Ar Total: 1 COWS:     Musculoskeletal: Strength & Muscle Tone: within normal limits Gait & Station: normal Patient leans: N/A  Psychiatric Specialty Exam:  Presentation  General Appearance:  Fairly Groomed  Eye Contact: Good  Speech: Clear and Coherent; Pressured  Speech Volume: Normal  Handedness: Right   Mood and Affect  Mood: Anxious; Irritable  Affect: Flat; Congruent   Thought Process  Thought Processes: Coherent  Descriptions of Associations:Intact  Orientation:Full (Time, Place and Person)  Thought Content:Rumination  History of Schizophrenia/Schizoaffective disorder:No  Duration of Psychotic Symptoms:Greater than six months  Hallucinations:Hallucinations: None  Ideas of Reference:None  Suicidal Thoughts:Suicidal Thoughts: No  Homicidal Thoughts:Homicidal Thoughts: No   Sensorium  Memory: Immediate Good; Remote Good; Recent Good  Judgment: Fair  Insight: Fair (reports need for  medication adherence)   Executive Functions  Concentration: Fair  Attention Span: Fair  Recall: Good  Fund of Knowledge: Good  Language: Good   Psychomotor Activity  Psychomotor Activity: Psychomotor Activity: Normal   Assets  Assets: Communication Skills; Housing; Health and safety inspector; Social Support   Sleep  Sleep: Sleep: Fair Number of Hours of Sleep: 6    Physical Exam: Physical Exam Vitals and nursing note reviewed.  Constitutional:      Appearance: Normal appearance.  HENT:     Head: Normocephalic and atraumatic.     Nose: Nose normal.  Pulmonary:     Effort: Pulmonary effort is normal.  Musculoskeletal:        General: Normal range of motion.     Cervical back: Normal range of motion.  Neurological:     General: No focal deficit present.     Mental Status: He is alert and oriented to person, place, and time. Mental status is at baseline.  Psychiatric:        Attention and Perception: Attention and perception normal.        Mood and Affect: Mood is anxious. Affect is flat.        Speech: Speech normal.        Behavior: Behavior normal. Behavior is cooperative.        Thought Content: Thought content normal.        Cognition and Memory: Cognition and memory normal.        Judgment: Judgment is impulsive.    Review of Systems  Musculoskeletal:  Positive for joint pain.  Neurological:  Positive for tremors.  Psychiatric/Behavioral:  Positive for substance abuse. The patient is nervous/anxious.    Blood pressure 132/87, pulse 87, temperature 97.9 F (36.6 C), resp. rate 18, height 5\' 8"  (1.727 m), weight 103.4 kg, SpO2 99%. Body mass index is 34.67 kg/m.   Treatment Plan Summary: Daily contact with patient to assess and evaluate symptoms and progress in treatment and Medication management Abilify 2 mg daily for mood stabilization. CIWA protocol with Ativan as needed for alcohol withdrawal management. Topiramate 50 mg daily to  support substance use recovery and assist with mood stabilization. Cymbalta 20 mg daily initiated specifically to address chronic pain and depression Close observation of mood, behavior, and suicidal ideation with daily risk assessments. Maintain strict suicide precautions throughout the inpatient stay Myriam Forehand, NP 06/16/2023,  7:05 PM

## 2023-06-16 NOTE — Group Note (Signed)
 Date:  06/16/2023 Time:  3:13 PM  Group Topic/Focus:  Goals Group:   The focus of this group is to help patients establish daily goals to achieve during treatment and discuss how the patient can incorporate goal setting into their daily lives to aide in recovery.    Participation Level:  Did Not Attend   Vernon Allen 06/16/2023, 3:13 PM

## 2023-06-16 NOTE — Plan of Care (Signed)
  Problem: Education: Goal: Mental status will improve Outcome: Progressing   Problem: Education: Goal: Emotional status will improve Outcome: Progressing   Problem: Activity: Goal: Interest or engagement in activities will improve Outcome: Progressing

## 2023-06-16 NOTE — BHH Counselor (Signed)
 Adult Comprehensive Assessment  Patient ID: Vernon Allen, male   DOB: 02/18/1981, 43 y.o.   MRN: [id number]  Information Source: Information source: Patient  Current Stressors:  Patient states their primary concerns and needs for treatment are:: "My mother took out commitment papers." Patient states their goals for this hospitilization and ongoing recovery are:: "Get sobered up" Educational / Learning stressors: Denies stressors Employment / Job issues: Is on short-term disability from his job due to having hip replacement surgery. Family Relationships: States he needs to spend more time with his sons. Financial / Lack of resources (include bankruptcy): Denies stressors Housing / Lack of housing: Denies stressors Physical health (include injuries & life threatening diseases): Recent hip replacement that is healing well. Social relationships: Denies stressors but also told doctor that his girlfriend has recently threatened to break up with him because of the alcohol problems. Substance abuse: Alcohol is "out of hand" Bereavement / Loss: Denies stressors  Living/Environment/Situation:  Living Arrangements: Children Living conditions (as described by patient or guardian): Good Who else lives in the home?: 20yo son How long has patient lived in current situation?: 16 years What is atmosphere in current home: Loving, Comfortable, Supportive  Family History:  Marital status: Long term relationship Long term relationship, how long?: 4 months What types of issues is patient dealing with in the relationship?: His alcohol use - she wants him to slow down. Additional relationship information: He is in the process of trying to get a divorce. Does patient have children?: Yes How is patient's relationship with their children?: 14yo son and 20yo son - good relationship with both  Childhood History:  By whom was/is the patient raised?: Both parents Description of patient's relationship with  caregiver when they were a child: Pretty good relationship with both parents growing up. Patient's description of current relationship with people who raised him/her: Mother - good relationship; Father - deceased about 3 years. How were you disciplined when you got in trouble as a child/adolescent?: "Ass whooped" Does patient have siblings?: Yes Number of Siblings: 1 Description of patient's current relationship with siblings: Sister - no relationship, "disowned" Did patient suffer any verbal/emotional/physical/sexual abuse as a child?: No Did patient suffer from severe childhood neglect?: No Has patient ever been sexually abused/assaulted/raped as an adolescent or adult?: No Was the patient ever a victim of a crime or a disaster?: No Witnessed domestic violence?: No Has patient been affected by domestic violence as an adult?: No  Education:  Highest grade of school patient has completed: GED Currently a Consulting civil engineer?: No Learning disability?: Yes What learning problems does patient have?: ADHD  Employment/Work Situation:   Employment Situation: Leave of absence (On short-term disability due to hip replacement surgery, works as a Games developer doing services calls) What is the Longest Time Patient has Held a Job?: 8-9 years 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?: Yes (Describe in comment) Garment/textile technologist) Did You Receive Any Psychiatric Treatment/Services While in the Military?: Yes Type of Psychiatric Treatment/Services in [employer]: counseling  Financial Resources:   Financial resources: Income from employment Does patient have a representative payee or guardian?: No  Alcohol/Substance Abuse:   What has been your use of drugs/alcohol within the last 12 months?: Alcohol - denies all drug use Alcohol/Substance Abuse Treatment Hx: Past Tx, Inpatient If yes, describe treatment: 14 years ago went to [facility] Has alcohol/substance abuse ever  caused legal problems?: No  Social Support System:   Patient's  Community Support System: Production assistant, radio System: mother, sons, grandparents, girlfriend, ex-girlfriend Type of faith/religion: Ephriam Knuckles How does patient's faith help to cope with current illness?: Psychologist, prison and probation services, has faith  Leisure/Recreation:   Do You Have Hobbies?: Yes Leisure and Hobbies: Working on cars, helping son with things  Strengths/Needs:   What is the patient's perception of their strengths?: Able to help my kids and what they go through Patient states they can use these personal strengths during their treatment to contribute to their recovery: Yes Patient states these barriers may affect/interfere with their treatment: None Patient states these barriers may affect their return to the community: None Other important information patient would like considered in planning for their treatment: None  Discharge Plan:   Currently receiving community mental health services: No Patient states concerns and preferences for aftercare planning are: Is willing to be referred to medication management and therapy. Patient states they will know when they are safe and ready for discharge when: "I'm ready now" Does patient have financial barriers related to discharge medications?: No Will patient be returning to same living situation after discharge?: Yes  Summary/Recommendations:   Summary and Recommendations (to be completed by the evaluator): Patient is a 43yo male with a history of Bipolar disorder who is hospitalized under IVC due to excessive alcohol consumption and suicidal statements.  He does have access to guns in his home, which will need to be addressed prior to discharge.  He reports that he is trying to get a divorce, had one long-term girlfriend but they broke up recently and he has a new girlfriend for the last 3-4 months.  His current girlfriend has threatened to leave him if he does not get the alcohol under  control.  He is employed as a Games developer doing service calls, but is currently on short-term disability after having hip replacement surgery from which he is healing very well.  He denies using any substance other than alcohol, went to rehabilitation at [facility] about 14 years ago and has had no other treatment, counseling, medicine, does not attend Alcoholics Anonymous meetings.  He lives with his 20yo son and will return there at discharge, states he can arrange transportation home.  He is willing to be referred in the community for medication management and therapy at discharge.  While in the hospital, he would benefit from crisis stabilization, medication administration, psychiatric evaluation, group therapy, milieu management, peer support, recreation, psychoeducation, and discharge planning. At discharge, it is recommended that he adhere to the established aftercare plan.  Lynnell Chad. 06/16/2023

## 2023-06-17 LAB — GLUCOSE, CAPILLARY: Glucose-Capillary: 96 mg/dL (ref 70–99)

## 2023-06-17 MED ORDER — DULOXETINE HCL 20 MG PO CPEP
20.0000 mg | ORAL_CAPSULE | Freq: Every day | ORAL | Status: DC
  Administered 2023-06-18 – 2023-06-19 (×2): 20 mg via ORAL
  Filled 2023-06-17 (×2): qty 1

## 2023-06-17 NOTE — Group Note (Signed)
 Recreation Therapy Group Note   Group Topic:Coping Skills  Group Date: 06/17/2023 Start Time: 1015 End Time: 1100 Facilitators: Rosina Lowenstein, LRT, CTRS Location:  Craft Room  Group Description: Mind Map.  Patient was provided a blank template of a diagram with 32 blank boxes in a tiered system, branching from the center (similar to a bubble chart). LRT directed patients to label the middle of the diagram "Coping Skills". LRT and patients then came up with 8 different coping skills as examples. Pt were directed to record their coping skills in the 2nd tier boxes closest to the center.  Patients would then share their coping skills with the group as LRT wrote them out. LRT gave a handout of 99 different coping skills at the end of group.   Goal Area(s) Addressed: Patients will be able to define "coping skills". Patient will identify new coping skills.  Patient will increase communication.   Affect/Mood: Appropriate and Flat   Participation Level: Moderate   Participation Quality: Independent   Behavior: Calm and Cooperative   Speech/Thought Process: Coherent   Insight: Fair   Judgement: Fair    Modes of Intervention: Clarification, Education, Guided Discussion, Support, and Worksheet   Patient Response to Interventions:  Receptive   Education Outcome:  Acknowledges education   Clinical Observations/Individualized Feedback: Vernon Allen was active in their participation of session activities and group discussion. Pt identified "going in other peoples coolers at the drag strip" as a coping skill. Pt smirked after sharing and peers laughed. Pt provided good insight on examples of unhealthy coping skills and said "physical fights" are one example. Overall, pt interacted well with LRT and peers duration of session.    Plan: Continue to engage patient in RT group sessions 2-3x/week.   Rosina Lowenstein, LRT, CTRS 06/17/2023 11:38 AM

## 2023-06-17 NOTE — Group Note (Signed)
 Date:  06/17/2023 Time:  11:12 AM  Group Topic/Focus:  Goals Group:   The focus of this group is to help patients establish daily goals to achieve during treatment and discuss how the patient can incorporate goal setting into their daily lives to aide in recovery. Rediscovering Joy:   The focus of this group is to explore various ways to relieve stress in a positive manner.    Participation Level:  Did Not Attend  Vernon Allen 06/17/2023, 11:12 AM

## 2023-06-17 NOTE — Progress Notes (Signed)
    06/17/23 0100  Psych Admission Type (Psych Patients Only)  Admission Status Involuntary  Psychosocial Assessment  Patient Complaints Anxiety  Eye Contact Fair  Facial Expression Worried  Affect Anxious  Speech Logical/coherent  Interaction Assertive  Motor Activity Slow  Appearance/Hygiene Unremarkable  Behavior Characteristics Cooperative  Mood Preoccupied  Thought Process  Coherency WDL  Content WDL  Delusions None reported or observed  Perception WDL  Hallucination None reported or observed  Judgment Impaired  Confusion None  Danger to Self  Current suicidal ideation? Denies  Danger to Others  Danger to Others None reported or observed

## 2023-06-17 NOTE — Progress Notes (Signed)
 Patient pleasant and cooperative. Denies SI, HI, AVH. Reports he is doing well and would like to be discharged on tomorrow. Visible in milieu, noted on phone early shift talking with family member. Voiced no concerns or complaints. Encouragement and support provided. Safety checks maintained. Medications given as prescribed. Pt receptive and remains safe on unit with q 15 min checks.

## 2023-06-17 NOTE — Plan of Care (Signed)
   Problem: Education: Goal: Emotional status will improve Outcome: Progressing   Problem: Education: Goal: Mental status will improve Outcome: Progressing   Problem: Activity: Goal: Interest or engagement in activities will improve Outcome: Progressing

## 2023-06-17 NOTE — Group Note (Signed)
 LCSW Group Therapy Note   Group Date: 06/17/2023 Start Time: 1300 End Time: 1400   Type of Therapy and Topic:  Group Therapy: Challenging Core Beliefs  Participation Level:  Active  Description of Group:  Patients were educated about core beliefs and asked to identify one harmful core belief that they have. Patients were asked to explore from where those beliefs originate. Patients were asked to discuss how those beliefs make them feel and the resulting behaviors of those beliefs. They were then be asked if those beliefs are true and, if so, what evidence they have to support them. Lastly, group members were challenged to replace those negative core beliefs with helpful beliefs.   Therapeutic Goals:   1. Patient will identify harmful core beliefs and explore the origins of such beliefs. 2. Patient will identify feelings and behaviors that result from those core beliefs. 3. Patient will discuss whether such beliefs are true. 4.  Patient will replace harmful core beliefs with helpful ones.  Summary of Patient Progress:  Patient actively engaged in processing and exploring how core beliefs are formed and how they impact thoughts, feelings, and behaviors. Patient proved open to input from peers and feedback from CSW. Patient demonstrated proficient insight into the subject matter, was respectful and supportive of peers, and participated throughout the entire session.  Therapeutic Modalities: Cognitive Behavioral Therapy; Solution-Focused Therapy   Lowry Ram, Theresia Majors 06/17/2023  1:58 PM

## 2023-06-17 NOTE — Plan of Care (Signed)
   Problem: Education: Goal: Emotional status will improve Outcome: Progressing Goal: Mental status will improve Outcome: Progressing Goal: Verbalization of understanding the information provided will improve Outcome: Progressing   Problem: Activity: Goal: Interest or engagement in activities will improve Outcome: Progressing Goal: Sleeping patterns will improve Outcome: Progressing

## 2023-06-17 NOTE — BH IP Treatment Plan (Signed)
 Interdisciplinary Treatment and Diagnostic Plan Update  06/17/2023 Time of Session: 9:39AM Vernon Allen MRN: [id number]  Principal Diagnosis: Bipolar I disorder, single manic episode, severe, with psychosis (HCC)  Secondary Diagnoses: Principal Problem:   Bipolar I disorder, single manic episode, severe, with psychosis (HCC) Active Problems:   EtOH dependence (HCC)   Suicide ideation   Substance induced mood disorder (HCC)   Current Medications:  Current Facility-Administered Medications  Medication Dose Route Frequency Provider Last Rate Last Admin   acetaminophen (TYLENOL) tablet 650 mg  650 mg Oral Q6H PRN Oneta Rack, NP   650 mg at 06/16/23 0825   alum & mag hydroxide-simeth (MAALOX/MYLANTA) 200-200-20 MG/5ML suspension 30 mL  30 mL Oral Q4H PRN Starleen Blue, NP       ARIPiprazole (ABILIFY) tablet 2 mg  2 mg Oral Daily Myriam Forehand, NP   2 mg at 06/17/23 0816   DULoxetine (CYMBALTA) DR capsule 20 mg  20 mg Oral Daily Myriam Forehand, NP   20 mg at 06/17/23 4098   haloperidol (HALDOL) tablet 5 mg  5 mg Oral TID PRN Oneta Rack, NP       hydrOXYzine (ATARAX) tablet 25 mg  25 mg Oral Q6H PRN Myriam Forehand, NP   25 mg at 06/16/23 1653   loperamide (IMODIUM) capsule 2-4 mg  2-4 mg Oral PRN Myriam Forehand, NP       LORazepam (ATIVAN) tablet 1 mg  1 mg Oral Q6H PRN Myriam Forehand, NP       LORazepam (ATIVAN) tablet 1 mg  1 mg Oral TID Myriam Forehand, NP   1 mg at 06/17/23 1191   Followed by   Melene Muller ON 06/18/2023] LORazepam (ATIVAN) tablet 1 mg  1 mg Oral BID Myriam Forehand, NP       Followed by   Melene Muller ON 06/19/2023] LORazepam (ATIVAN) tablet 1 mg  1 mg Oral Daily Myriam Forehand, NP       magnesium hydroxide (MILK OF MAGNESIA) suspension 30 mL  30 mL Oral Daily PRN Oneta Rack, NP       melatonin tablet 5 mg  5 mg Oral QHS Myriam Forehand, NP   5 mg at 06/16/23 2109   multivitamin with minerals tablet 1 tablet  1 tablet Oral Daily Myriam Forehand, NP   1 tablet at 06/17/23  0815   OLANZapine (ZYPREXA) injection 10 mg  10 mg Intramuscular TID PRN Starleen Blue, NP       ondansetron (ZOFRAN-ODT) disintegrating tablet 4 mg  4 mg Oral Q6H PRN Myriam Forehand, NP       thiamine (VITAMIN B1) tablet 100 mg  100 mg Oral Daily Myriam Forehand, NP   100 mg at 06/17/23 0816   topiramate (TOPAMAX) tablet 50 mg  50 mg Oral Daily Myriam Forehand, NP   50 mg at 06/17/23 0816   traZODone (DESYREL) tablet 50 mg  50 mg Oral QHS PRN Oneta Rack, NP   50 mg at 06/16/23 2109   PTA Medications: Medications Prior to Admission  Medication Sig Dispense Refill Last Dose/Taking   ondansetron (ZOFRAN) 4 MG tablet Take 1 tablet (4 mg total) by mouth every 6 (six) hours as needed for nausea. (Patient not taking: Reported on 06/14/2023) 30 tablet 0    oxyCODONE (OXY IR/ROXICODONE) 5 MG immediate release tablet Take 1-2 tablets (5-10 mg total) by mouth every 4 (four) hours as needed for moderate  pain (pain score 4-6). (Patient taking differently: Take 5 mg by mouth every 12 (twelve) hours as needed for moderate pain (pain score 4-6).) 40 tablet 0    Pseudoeph-Doxylamine-DM-APAP (NYQUIL PO) Take 30 mLs by mouth every 6 (six) hours as needed (For cold symptoms).       Patient Stressors: Marital or family conflict   Substance abuse    Patient Strengths: Ability for Copy fund of knowledge  Supportive family/friends   Treatment Modalities: Medication Management, Group therapy, Case management,  1 to 1 session with clinician, Psychoeducation, Recreational therapy.   Physician Treatment Plan for Primary Diagnosis: Bipolar I disorder, single manic episode, severe, with psychosis (HCC) Long Term Goal(s): Improvement in symptoms so as ready for discharge   Short Term Goals: Ability to identify changes in lifestyle to reduce recurrence of condition will improve Ability to verbalize feelings will improve Ability to disclose and discuss suicidal ideas Ability to  demonstrate self-control will improve Ability to identify and develop effective coping behaviors will improve Ability to maintain clinical measurements within normal limits will improve Compliance with prescribed medications will improve Ability to identify triggers associated with substance abuse/mental health issues will improve  Medication Management: Evaluate patient's response, side effects, and tolerance of medication regimen.  Therapeutic Interventions: 1 to 1 sessions, Unit Group sessions and Medication administration.  Evaluation of Outcomes: Not Met  Physician Treatment Plan for Secondary Diagnosis: Principal Problem:   Bipolar I disorder, single manic episode, severe, with psychosis (HCC) Active Problems:   EtOH dependence (HCC)   Suicide ideation   Substance induced mood disorder (HCC)  Long Term Goal(s): Improvement in symptoms so as ready for discharge   Short Term Goals: Ability to identify changes in lifestyle to reduce recurrence of condition will improve Ability to verbalize feelings will improve Ability to disclose and discuss suicidal ideas Ability to demonstrate self-control will improve Ability to identify and develop effective coping behaviors will improve Ability to maintain clinical measurements within normal limits will improve Compliance with prescribed medications will improve Ability to identify triggers associated with substance abuse/mental health issues will improve     Medication Management: Evaluate patient's response, side effects, and tolerance of medication regimen.  Therapeutic Interventions: 1 to 1 sessions, Unit Group sessions and Medication administration.  Evaluation of Outcomes: Not Met   RN Treatment Plan for Primary Diagnosis: Bipolar I disorder, single manic episode, severe, with psychosis (HCC) Long Term Goal(s): Knowledge of disease and therapeutic regimen to maintain health will improve  Short Term Goals: Ability to demonstrate  self-control, Ability to participate in decision making will improve, Ability to verbalize feelings will improve, Ability to disclose and discuss suicidal ideas, Ability to identify and develop effective coping behaviors will improve, and Compliance with prescribed medications will improve  Medication Management: RN will administer medications as ordered by provider, will assess and evaluate patient's response and provide education to patient for prescribed medication. RN will report any adverse and/or side effects to prescribing provider.  Therapeutic Interventions: 1 on 1 counseling sessions, Psychoeducation, Medication administration, Evaluate responses to treatment, Monitor vital signs and CBGs as ordered, Perform/monitor CIWA, COWS, AIMS and Fall Risk screenings as ordered, Perform wound care treatments as ordered.  Evaluation of Outcomes: Not Met   LCSW Treatment Plan for Primary Diagnosis: Bipolar I disorder, single manic episode, severe, with psychosis (HCC) Long Term Goal(s): Safe transition to appropriate next level of care at discharge, Engage patient in therapeutic group addressing interpersonal concerns.  Short  Term Goals: Engage patient in aftercare planning with referrals and resources, Increase social support, Increase ability to appropriately verbalize feelings, Increase emotional regulation, Facilitate acceptance of mental health diagnosis and concerns, Facilitate patient progression through stages of change regarding substance use diagnoses and concerns, and Identify triggers associated with mental health/substance abuse issues  Therapeutic Interventions: Assess for all discharge needs, 1 to 1 time with Social worker, Explore available resources and support systems, Assess for adequacy in community support network, Educate family and significant other(s) on suicide prevention, Complete Psychosocial Assessment, Interpersonal group therapy.  Evaluation of Outcomes: Not  Met   Progress in Treatment: Attending groups: Yes. Participating in groups: Yes. Taking medication as prescribed: Yes. Toleration medication: Yes. Family/Significant other contact made: No, will contact:  once permission has been granted.  Patient understands diagnosis: Yes. Discussing patient identified problems/goals with staff: Yes. Medical problems stabilized or resolved: Yes. Denies suicidal/homicidal ideation: Yes. Issues/concerns per patient self-inventory: No. Other: none  New problem(s) identified: No, Describe:  none  New Short Term/Long Term Goal(s): detox, elimination of symptoms of psychosis, medication management for mood stabilization; elimination of SI thoughts; development of comprehensive mental wellness/sobriety plan.   Patient Goals:  "to get out of here, be a better dad, get to an AA meeting"  Discharge Plan or Barriers: Patient reports plans to discharge to his home.  He reports that he is open to [facility] in the evenings so that it will not interfere with his employment.   Reason for Continuation of Hospitalization: Anxiety Depression Medication stabilization Suicidal ideation Withdrawal symptoms  Estimated Length of Stay:  1-7 days  Last 3 [place] Suicide Severity Risk Score: Flowsheet Row Admission (Current) from 06/15/2023 in [facility] ED from 06/14/2023 in [facility] Admission (Discharged) from 04/11/2023 in [facility] (1A)  C-SSRS RISK CATEGORY No Risk No Risk No Risk       Last PHQ 2/9 Scores:    06/14/2023    3:21 PM 03/02/2022    2:07 PM  Depression screen PHQ 2/9  Decreased Interest 0 0  Down, Depressed, Hopeless 0 0  PHQ - 2 Score 0 0    Scribe for Treatment Team: MIKI BLANK, [place] 06/17/2023 10:36 AM

## 2023-06-17 NOTE — Progress Notes (Signed)
 [facility] MD Progress Note  06/18/2023 12:20 AM Vernon Allen  MRN:  [id number]  43 year old Caucasian male currently IVC with a long-standing history of alcohol dependence and multiple prior admissions for detoxification. IVC petition filed by his mother, d/t escalating alcohol abuse and recent suicidal remarks. Reportedly pt sent a text to his son stating that he wanted to end his life, recently threatened to use a firearm to kill himself, has sent pictures to his girlfriend of himself holding a knife to his wrist. Reportedly pt presented with inconsistencies in story upon admission.  Subjective:   Patient's case discussed with treatment team, all vitals and staff notes were reviewed.  Reports that he was admitted to the inpatient behavioral health unit because he pissed off his mother, and she became " vindictive" because she was not agreeable with him having his new girlfriend over at the home which he rents from his mother.  He is denying making any suicidal statements, states that he only made a comment about getting a bottle of liquor and " killing it".  He is denying any history of suicide attempts, access to firearms.  Reports that he has been out of work for a few weeks now due to having a hip replacement.  Stated that whenever he is working he does not have time for drinking, that he has only been consuming a 6 pack of 12 ounce beer daily.  He is currently denying any withdrawal symptoms, but he does appear to have some tremors mild.  Reports he has been going to groups, reports good sleep and appetite.  States he is anxious because he would prefer to be at home.  Denies SI/HI and AVH.  He does complain that he feels as if his morning medications are making him sleepy at around 11 AM.  Agreeable with switching duloxetine to nighttime.  Affect is guarded.  Insight and judgment impaired.  Principal Problem: Bipolar I disorder, single manic episode, severe, with psychosis (HCC) Diagnosis: Principal  Problem:   Bipolar I disorder, single manic episode, severe, with psychosis (HCC) Active Problems:   EtOH dependence (HCC)   Suicide ideation   Substance induced mood disorder (HCC)  Total Time spent with patient: 1 hour  Past Psychiatric History: see below  Past Medical History:  Past Medical History:  Diagnosis Date   Alcohol abuse    Allergy to alpha-gal    Anxiety    Avascular necrosis of bone of right hip (HCC) 02/2023   Bipolar 1 disorder (HCC)    Elevated blood pressure reading with diagnosis of hypertension    Perforated appendicitis 10/09/2018   Perirectal abscess 05/19/2021    Past Surgical History:  Procedure Laterality Date   BACK SURGERY  2008   L4-5 ruptured disc   INCISION AND DRAINAGE PERIRECTAL ABSCESS Left 05/19/2021   LAPAROSCOPIC APPENDECTOMY N/A 10/09/2018   Procedure: APPENDECTOMY LAPAROSCOPIC ATTEMPTED;  Surgeon: Leafy Ro, MD;  Location: [facility] ORS;  Service: General;  Laterality: N/A;   PARTIAL COLECTOMY Right 10/09/2018   Procedure: PARTIAL COLECTOMY;  Surgeon: Leafy Ro, MD;  Location: [facility] ORS;  Service: General;  Laterality: Right;   TOTAL HIP ARTHROPLASTY Right 04/11/2023   Procedure: TOTAL HIP ARTHROPLASTY;  Surgeon: Christena Flake, MD;  Location: [facility] ORS;  Service: Orthopedics;  Laterality: Right;   Family History:  Family History  Adopted: Yes   Family Psychiatric  History: none reported Social History:  Social History   Substance and Sexual Activity  Alcohol Use Yes  Alcohol/week: 12.0 standard drinks of alcohol   Types: 12 Cans of beer per week     Social History   Substance and Sexual Activity  Drug Use Yes   Types: Marijuana   Comment: occassional    Social History   Socioeconomic History   Marital status: Divorced    Spouse name: Not on file   Number of children: 2   Years of education: Not on file   Highest education level: Not on file  Occupational History   Not on file  Tobacco Use   Smoking status:  Some Days    Current packs/day: 0.25    Types: Cigarettes   Smokeless tobacco: Never  Vaping Use   Vaping status: Never Used  Substance and Sexual Activity   Alcohol use: Yes    Alcohol/week: 12.0 standard drinks of alcohol    Types: 12 Cans of beer per week   Drug use: Yes    Types: Marijuana    Comment: occassional   Sexual activity: Not on file  Other Topics Concern   Not on file  Social History Narrative   Lives with sons and fiance   Social Drivers of Health   Financial Resource Strain: Medium Risk (05/24/2023)   Received from [facility]   Overall Financial Resource Strain (CARDIA)    Difficulty of Paying Living Expenses: Somewhat hard  Food Insecurity: No Food Insecurity (06/15/2023)   Hunger Vital Sign    Worried About Running Out of Food in the Last Year: Never true    Ran Out of Food in the Last Year: Never true  Recent Concern: Food Insecurity - Food Insecurity Present (05/24/2023)   Received from [facility]   Hunger Vital Sign    Worried About Running Out of Food in the Last Year: Never true    Ran Out of Food in the Last Year: Often true  Transportation Needs: Unknown (06/15/2023)   PRAPARE - Transportation    Lack of Transportation (Medical): Patient unable to answer    Lack of Transportation (Non-Medical): No  Recent Concern: Transportation Needs - Unmet Transportation Needs (05/24/2023)   [facility] from [facility] - Transportation    In the past 12 months, has lack of transportation kept you from medical appointments or from getting medications?: Yes    Lack of Transportation (Non-Medical): Yes  Physical Activity: Not on file  Stress: Not on file  Social Connections: Not on file   Additional Social History:                         Sleep: Fair  Appetite:  Good  Current Medications: Current Facility-Administered Medications  Medication Dose Route Frequency Provider Last Rate  Last Admin   acetaminophen (TYLENOL) tablet 650 mg  650 mg Oral Q6H PRN Oneta Rack, NP   650 mg at 06/16/23 0825   alum & mag hydroxide-simeth (MAALOX/MYLANTA) 200-200-20 MG/5ML suspension 30 mL  30 mL Oral Q4H PRN Starleen Blue, NP       ARIPiprazole (ABILIFY) tablet 2 mg  2 mg Oral Daily Myriam Forehand, NP   2 mg at 06/17/23 0816   DULoxetine (CYMBALTA) DR capsule 20 mg  20 mg Oral QHS Laqueshia Cihlar, PA-C       haloperidol (HALDOL) tablet 5 mg  5 mg Oral TID PRN Oneta Rack, NP       hydrOXYzine (ATARAX) tablet 25 mg  25  mg Oral Q6H PRN Myriam Forehand, NP   25 mg at 06/16/23 1653   loperamide (IMODIUM) capsule 2-4 mg  2-4 mg Oral PRN Myriam Forehand, NP       LORazepam (ATIVAN) tablet 1 mg  1 mg Oral Q6H PRN Myriam Forehand, NP       LORazepam (ATIVAN) tablet 1 mg  1 mg Oral BID Myriam Forehand, NP       Followed by   Melene Muller ON 06/19/2023] LORazepam (ATIVAN) tablet 1 mg  1 mg Oral Daily Myriam Forehand, NP       magnesium hydroxide (MILK OF MAGNESIA) suspension 30 mL  30 mL Oral Daily PRN Oneta Rack, NP       melatonin tablet 5 mg  5 mg Oral QHS Myriam Forehand, NP   5 mg at 06/17/23 2112   multivitamin with minerals tablet 1 tablet  1 tablet Oral Daily Myriam Forehand, NP   1 tablet at 06/17/23 0815   OLANZapine (ZYPREXA) injection 10 mg  10 mg Intramuscular TID PRN Starleen Blue, NP       ondansetron (ZOFRAN-ODT) disintegrating tablet 4 mg  4 mg Oral Q6H PRN Myriam Forehand, NP       thiamine (VITAMIN B1) tablet 100 mg  100 mg Oral Daily Myriam Forehand, NP   100 mg at 06/17/23 1610   topiramate (TOPAMAX) tablet 50 mg  50 mg Oral Daily Myriam Forehand, NP   50 mg at 06/17/23 9604   traZODone (DESYREL) tablet 50 mg  50 mg Oral QHS PRN Oneta Rack, NP   50 mg at 06/17/23 2112    Lab Results:  Results for orders placed or performed during the hospital encounter of 06/15/23 (from the past 48 hours)  Lipid panel     Status: Abnormal   Collection Time: 06/16/23  8:37 AM  Result Value Ref  Range   Cholesterol 232 (H) 0 - 200 mg/dL   Triglycerides 540 (H) <150 mg/dL   HDL 75 >98 mg/dL   Total CHOL/HDL Ratio 3.1 RATIO   VLDL 47 (H) 0 - 40 mg/dL   LDL Cholesterol 119 (H) 0 - 99 mg/dL    Comment:        Total Cholesterol/HDL:CHD Risk Coronary Heart Disease Risk Table                     Men   Women  1/2 Average Risk   3.4   3.3  Average Risk       5.0   4.4  2 X Average Risk   9.6   7.1  3 X Average Risk  23.4   11.0        Use the calculated Patient Ratio above and the CHD Risk Table to determine the patient's CHD Risk.        ATP III CLASSIFICATION (LDL):  <100     mg/dL   Optimal  147-829  mg/dL   Near or Above                    Optimal  130-159  mg/dL   Borderline  562-130  mg/dL   High  >865     mg/dL   Very High Performed at [facility], [address] [place], [place] [address]   Hemoglobin A1c     Status: Abnormal   Collection Time: 06/16/23  8:37 AM  Result Value Ref  Range   Hgb A1c MFr Bld 4.6 (L) 4.8 - 5.6 %    Comment: (NOTE) Pre diabetes:          5.7%-6.4%  Diabetes:              >6.4%  Glycemic control for   <7.0% adults with diabetes    Mean Plasma Glucose 85.32 mg/dL    Comment: Performed at [facility], [address] [place], [place] [address]    Blood Alcohol level:  Lab Results  Component Value Date   ETH 82 (H) 06/14/2023    Metabolic Disorder Labs: Lab Results  Component Value Date   HGBA1C 4.6 (L) 06/16/2023   MPG 85.32 06/16/2023   MPG 91.06 06/14/2023   No results found for: "PROLACTIN" Lab Results  Component Value Date   CHOL 232 (H) 06/16/2023   TRIG 235 (H) 06/16/2023   HDL 75 06/16/2023   CHOLHDL 3.1 06/16/2023   VLDL 47 (H) 06/16/2023   LDLCALC 110 (H) 06/16/2023   LDLCALC UNABLE TO CALCULATE IF TRIGLYCERIDE OVER 400 mg/dL 60/45/4098    Physical Findings: AIMS:  , ,  ,  ,    CIWA:  CIWA-Ar Total: 3 COWS:     Musculoskeletal: Strength & Muscle Tone: within normal limits Gait &  Station: normal Patient leans: N/A  Psychiatric Specialty Exam:  Presentation  General Appearance:  Appropriate for Environment  Eye Contact: Fleeting  Speech: Normal Rate  Speech Volume: Normal  Handedness: Right   Mood and Affect  Mood: Anxious  Affect: Blunt; Restricted   Thought Process  Thought Processes: Linear  Descriptions of Associations:Intact  Orientation:Full (Time, Place and Person)  Thought Content:Logical  History of Schizophrenia/Schizoaffective disorder:No  Duration of Psychotic Symptoms:Greater than six months  Hallucinations:Hallucinations: None   Ideas of Reference:None  Suicidal Thoughts:Suicidal Thoughts: No   Homicidal Thoughts:Homicidal Thoughts: No    Sensorium  Memory: Immediate Good; Recent Good; Remote Good  Judgment: Fair  Insight: Poor   Executive Functions  Concentration: Fair  Attention Span: Fair  Recall: Good  Fund of Knowledge: Fair  Language: Good   Psychomotor Activity  Psychomotor Activity: Psychomotor Activity: Normal    Assets  Assets: Housing   Sleep  Sleep: Sleep: Good     Physical Exam: Physical Exam Vitals and nursing note reviewed.  Constitutional:      Appearance: Normal appearance.  HENT:     Head: Normocephalic and atraumatic.  Eyes:     Pupils: Pupils are equal, round, and reactive to light.  Pulmonary:     Effort: Pulmonary effort is normal.  Musculoskeletal:        General: Normal range of motion.     Cervical back: Normal range of motion.  Neurological:     General: No focal deficit present.     Mental Status: He is alert and oriented to person, place, and time. Mental status is at baseline.  Psychiatric:        Attention and Perception: Attention and perception normal.        Mood and Affect: Mood is anxious. Affect is flat.        Speech: Speech normal.        Behavior: Behavior normal. Behavior is cooperative.        Thought Content:  Thought content normal.        Cognition and Memory: Cognition and memory normal.        Judgment: Judgment is impulsive.    Review of Systems  Musculoskeletal:  Positive for joint pain.  Neurological:  Positive for tremors.  Psychiatric/Behavioral:  Positive for substance abuse. The patient is nervous/anxious.    Blood pressure 128/83, pulse 88, temperature 98.4 F (36.9 C), resp. rate 15, height 5\' 8"  (1.727 m), weight 103.4 kg, SpO2 98%. Body mass index is 34.67 kg/m.   Treatment Plan Summary:  1.    Safety and Monitoring:   --  Involuntary admission to inpatient psychiatric unit for safety, stabilization and treatment -- Daily contact with patient to assess and evaluate symptoms and progress in treatment -- Patient's case to be discussed in multi-disciplinary team meeting -- Observation Level : q15 minute checks -- Vital signs:  q12 hours -- Precautions: suicide   2. Psychiatric Diagnoses and Treatment:    -- Continue Abilify 2 mg daily for mood stabilization   -- Switch Cymbalta 20 mg daily to HS for depressive sxs (pt complaints of AM somnolence)   -- Continue Topiramate 50 mg daily to support substance use recovery and assist with mood stabilization.  --  The risks/benefits/side-effects/alternatives to this medication were discussed in detail with the patient and time was given for questions. The patient consents to medication trial.  -- Encouraged patient to participate in unit milieu and in scheduled group therapies  -- Short Term Goals: Ability to identify changes in lifestyle to reduce recurrence of condition will improve, Ability to verbalize feelings will improve, Ability to disclose and discuss suicidal ideas, Ability to demonstrate self-control will improve, Ability to identify and develop effective coping behaviors will improve, Ability to maintain clinical measurements within normal limits will improve, Compliance with prescribed medications will improve, and  Ability to identify triggers associated with substance abuse/mental health issues will improve -- Long Term Goals: Improvement in symptoms so as ready for discharge        3. Medical Issues Being Addressed:    Alcohol use disorder  -- Continue CIWA protocol                Tobacco Use Disorder             -- Nicotine gum 2mg  PRN             -- Smoking cessation encouraged   4. Discharge Planning:   -- Social work and case management to assist with discharge planning and identification of hospital follow-up needs prior to discharge -- Estimated LOS: 5-7 days -- Discharge Concerns: Need to establish a safety plan; Medication compliance and effectiveness -- Discharge Goals: Return home with outpatient referrals for mental health follow-up including medication management/psychotherapy    Daily contact with patient to assess and evaluate symptoms and progress in treatment and Medication management Topiramate 50 mg daily to support substance use recovery and assist with mood stabilization.   Rexton Greulich, PA-C 06/18/2023, 12:20 AM

## 2023-06-17 NOTE — [facility] Suicide Risk Assessment (Addendum)
 [facility] INPATIENT:  Family/Significant Other Suicide Prevention Education  Suicide Prevention Education:  Education Completed; Vernon Allen, mother, [phone] has been identified by the patient as the family member/significant other with whom the patient will be residing, and identified as the person(s) who will aid the patient in the event of a mental health crisis (suicidal ideations/suicide attempt).  With written consent from the patient, the family member/significant other has been provided the following suicide prevention education, prior to the and/or following the discharge of the patient.  The suicide prevention education provided includes the following: Suicide risk factors Suicide prevention and interventions National Suicide Hotline telephone number [facility] assessment telephone number [facility] Emergency Assistance 911 [facility] and/or Residential Mobile Crisis Unit telephone number  Request made of family/significant other to: Remove weapons (e.g., guns, rifles, knives), all items previously/currently identified as safety concern.   Remove drugs/medications (over-the-counter, prescriptions, illicit drugs), all items previously/currently identified as a safety concern.  According to patient's mother the patient currently lives in the home with herself, the patient's son and patient's ex-wife of 13 years. Mother added that she has been the sole caretaker of the patient and his ex-wife and that with her age, it has been "too much to handle." The mother added that she has been working to get the ex-wife out of the home but that her family doesn't want to be involved. Mother added that navigating this with the patient's alcoholism has made her move before in the past but that she isn't able to "get away." Mother added that the patient's drinking has caused "discord" in both her and his son's life. Mother explained that there are no issues between her and the  patient's current girlfriend. Mother explained that she is religious and that she doesn't want two people that aren't married being in the home. Mother added that she is "overwhelmed" due to the patient's drinking. Mother added that the patient doesn't have access to any weapons and is usually compliant with medication.   The family member/significant other verbalizes understanding of the suicide prevention education information provided.  The family member/significant other agrees to remove the items of safety concern listed above.    Vernon Allen 06/17/2023, 2:53 PM

## 2023-06-17 NOTE — Plan of Care (Signed)
   Problem: Education: Goal: Knowledge of Vernon Allen General Education information/materials will improve Outcome: Progressing Goal: Emotional status will improve Outcome: Progressing Goal: Mental status will improve Outcome: Progressing Goal: Verbalization of understanding the information provided will improve Outcome: Progressing   Problem: Activity: Goal: Interest or engagement in activities will improve Outcome: Progressing Goal: Sleeping patterns will improve Outcome: Progressing   Problem: Coping: Goal: Ability to verbalize frustrations and anger appropriately will improve Outcome: Progressing Goal: Ability to demonstrate self-control will improve Outcome: Progressing

## 2023-06-17 NOTE — Group Note (Signed)
 Date:  06/17/2023 Time:  3:59 PM  Group Topic/Focus:  Outdoor recreation structured activity.    Participation Level:  Active  Participation Quality:  Appropriate  Affect:  Appropriate  Cognitive:  Appropriate  Insight: Appropriate  Engagement in Group:  Developing/Improving  Modes of Intervention:  Activity  Additional Comments:    Vernon Allen 06/17/2023, 3:59 PM

## 2023-06-17 NOTE — Group Note (Signed)
 Date:  06/17/2023 Time:  8:49 PM  Group Topic/Focus:  Wrap-Up Group:   The focus of this group is to help patients review their daily goal of treatment and discuss progress on daily workbooks.    Participation Level:  Active  Participation Quality:  Appropriate  Affect:  Appropriate  Cognitive:  Appropriate  Insight: Appropriate  Engagement in Group:  Engaged  Modes of Intervention:  Activity  Additional Comments:    Vernon Allen 06/17/2023, 8:49 PM

## 2023-06-17 NOTE — Progress Notes (Addendum)
 Pt continue to me on a Ativan taper, for ETOH, he denied sx but noted with hand tremors. Pt denied SI/H? Plan or intent  and gave a safety committment. Pt is managed on q 15 min rounds.   06/17/23 1400  Psych Admission Type (Psych Patients Only)  Admission Status Involuntary  Psychosocial Assessment  Patient Complaints Anxiety  Eye Contact Fair  Facial Expression Sad;Worried  Affect Anxious;Labile  Speech Logical/coherent  Interaction Assertive  Motor Activity  (WNL)  Appearance/Hygiene Unremarkable  Behavior Characteristics Cooperative  Mood Preoccupied  Thought Process  Coherency WDL  Content WDL  Delusions None reported or observed  Perception WDL  Hallucination None reported or observed  Judgment Impaired  Confusion None  Danger to Self  Current suicidal ideation? Denies  Danger to Others  Danger to Others None reported or observed

## 2023-06-17 NOTE — Group Note (Signed)
 Recreation Therapy Group Note   Group Topic:Health and Wellness  Group Date: 06/17/2023 Start Time: 1500 End Time: 1600 Facilitators: Rosina Lowenstein, LRT, CTRS Location: Courtyard  Group Description: [employer]. LRT and patients played games of basketball, drew with chalk, and played corn hole while outside in the courtyard while getting fresh air and sunlight. Music was being played in the background. LRT and peers conversed about different games they have played before, what they do in their free time and anything else that is on their minds. LRT encouraged pts to drink water after being outside, sweating and getting their heart rate up.  Goal Area(s) Addressed: Patient will build on frustration tolerance skills. Patients will partake in a competitive play game with peers. Patients will gain knowledge of new leisure interest/hobby.   Affect/Mood: Appropriate   Participation Level: Moderate    Clinical Observations/Individualized Feedback: Vernon Allen came late to group. Pt chose to talk with peers while present.   Plan: Continue to engage patient in RT group sessions 2-3x/week.   Rosina Lowenstein, LRT, CTRS 06/17/2023 5:13 PM

## 2023-06-18 DIAGNOSIS — F302 Manic episode, severe with psychotic symptoms: Secondary | ICD-10-CM | POA: Diagnosis not present

## 2023-06-18 MED ORDER — ARIPIPRAZOLE 5 MG PO TABS
5.0000 mg | ORAL_TABLET | Freq: Every day | ORAL | Status: DC
Start: 2023-06-19 — End: 2023-06-20
  Administered 2023-06-19 – 2023-06-20 (×2): 5 mg via ORAL
  Filled 2023-06-18 (×2): qty 1

## 2023-06-18 NOTE — Group Note (Signed)
 Date:  06/18/2023 Time:  9:45 AM  Group Topic/Focus:  Goals Group:   The focus of this group is to help patients establish daily goals to achieve during treatment and discuss how the patient can incorporate goal setting into their daily lives to aide in recovery.    Participation Level:  Active  Participation Quality:  Appropriate  Affect:  Appropriate  Cognitive:  Appropriate  Insight: Appropriate  Engagement in Group:  Engaged  Modes of Intervention:  Discussion, Education, and Support  Additional Comments:    Wilford Corner 06/18/2023, 9:45 AM

## 2023-06-18 NOTE — Plan of Care (Signed)
 Noted patient sad and tearful states " I want to get out from here. I need to spend time with my kids."  Vistaril given. Redirected patient to group.Patient receptive with staff. Denies SI,HI and AVH. Appetite and energy level good. Support and encouragement given.

## 2023-06-18 NOTE — Group Note (Signed)
 [facility] LCSW Group Therapy Note   Group Date: 06/18/2023 Start Time: 1315 End Time: 1415   Type of Therapy and Topic: Group Therapy: Avoiding Self-Sabotaging and Enabling Behaviors  Participation Level: Active  Mood:  Description of Group:  In this group, patients will learn how to identify obstacles, self-sabotaging and enabling behaviors, as well as: what are they, why do we do them and what needs these behaviors meet. Discuss unhealthy relationships and how to have positive healthy boundaries with those that sabotage and enable. Explore aspects of self-sabotage and enabling in yourself and how to limit these self-destructive behaviors in everyday life.   Therapeutic Goals: 1. Patient will identify one obstacle that relates to self-sabotage and enabling behaviors 2. Patient will identify one personal self-sabotaging or enabling behavior they did prior to admission 3. Patient will state a plan to change the above identified behavior 4. Patient will demonstrate ability to communicate their needs through discussion and/or role play.    Summary of Patient Progress: Patient was present in group. Paitent was active and engaged throughout. Paitnet was supportive of other group members.  Patient shared how people, forgiveness and expectations can be sabotage.  He was able to engage in discussion on coping skills and was appropriate.  Patient displayed fair insight.    Therapeutic Modalities:  Cognitive Behavioral Therapy Person-Centered Therapy Motivational Interviewing    Harden Mo, LCSW

## 2023-06-18 NOTE — Group Note (Signed)
 Recreation Therapy Group Note   Group Topic:Health and Wellness  Group Date: 06/18/2023 Start Time: 1440 End Time: 1600 Facilitators: Rosina Lowenstein, LRT, CTRS Location: Courtyard  Group Description: [employer]. LRT and patients played games of basketball, drew with chalk, and played corn hole while outside in the courtyard while getting fresh air and sunlight. Music was being played in the background. LRT and peers conversed about different games they have played before, what they do in their free time and anything else that is on their minds. LRT encouraged pts to drink water after being outside, sweating and getting their heart rate up.  Goal Area(s) Addressed: Patient will build on frustration tolerance skills. Patients will partake in a competitive play game with peers. Patients will gain knowledge of new leisure interest/hobby.    Affect/Mood: Appropriate   Participation Level: Active   Participation Quality: Independent   Behavior: Appropriate   Speech/Thought Process: Coherent   Insight: Fair   Judgement: Fair    Modes of Intervention: Activity   Patient Response to Interventions:  Receptive   Education Outcome:  Acknowledges education   Clinical Observations/Individualized Feedback: Vernon Allen was active in their participation of session activities and group discussion. Pt chose to play basketball and talk with peers while outside. Pt interacted well with LRT and peers duration of session.    Plan: Continue to engage patient in RT group sessions 2-3x/week.   Rosina Lowenstein, LRT, CTRS 06/18/2023 5:04 PM

## 2023-06-18 NOTE — Group Note (Signed)
 Recreation Therapy Group Note   Group Topic:Problem Solving  Group Date: 06/18/2023 Start Time: 1000 End Time: 1055 Facilitators: Rosina Lowenstein, LRT, CTRS Location:  Craft Room  Group Description: Life Boat. Patients were given the scenario that they are on a boat that is about to become shipwrecked, leaving them stranded on an [place]. They are asked to make a list of 15 different items that they want to take with them when they are stranded on the [place]. Patients are asked to rank their items from most important to least important, #1 being the most important and #15 being the least. Patients will work individually for the first round to come up with 15 items and then pair up with a peer(s) to condense their list and come up with one list of 15 items between the two of them. Patients or LRT will read aloud the 15 different items to the group after each round. LRT facilitated post-activity processing to discuss how this activity can be used in daily life post discharge.   Goal Area(s) Addressed:  Patient will identify priorities, wants and needs. Patient will communicate with LRT and peers. Patient will work collectively as a Administrator, Civil Service. Patient will work on Product manager.    Affect/Mood: N/A   Participation Level: Did not attend    Clinical Observations/Individualized Feedback: Patient did not attend group.   Plan: Continue to engage patient in RT group sessions 2-3x/week.   Rosina Lowenstein, LRT, CTRS 06/18/2023 11:23 AM

## 2023-06-18 NOTE — Progress Notes (Signed)
 [facility] MD Progress Note  06/18/2023 10:27 PM Vernon Allen  MRN:  [id number]  43 year old Caucasian male currently IVC with a long-standing history of alcohol dependence and multiple prior admissions for detoxification. IVC petition filed by his mother, d/t escalating alcohol abuse and recent suicidal remarks. Reportedly pt sent a text to his son stating that he wanted to end his life, recently threatened to use a firearm to kill himself, has sent pictures to his girlfriend of himself holding a knife to his wrist. Reportedly pt presented with inconsistencies in story upon admission.  Subjective:   Patient's case discussed with treatment team, all vitals and staff notes were reviewed.  Pt seen for reassessment, continues to be discharge focused. Reports that he is less somnolent this AM. Reports good sleep and appetite. Agreeable with outpatient SUD treatment. Denies depressed mood, reports that he is more "anxious because I miss my son and I'm ready to get back home". Denies SI/HI and AVH. Denies current withdrawal symptoms. Alert and oriented x4. Affect is blunt, depressed. Insight poor and judgment fair. Per staff pt resides at home with his mother, ex-spouse and son. Pt denies this.   Will contact mother for further collateral.   Principal Problem: Bipolar I disorder, single manic episode, severe, with psychosis (HCC) Diagnosis: Principal Problem:   Bipolar I disorder, single manic episode, severe, with psychosis (HCC) Active Problems:   EtOH dependence (HCC)   Suicide ideation   Substance induced mood disorder (HCC)  Total Time spent with patient: 1 hour  Past Psychiatric History: see below  Past Medical History:  Past Medical History:  Diagnosis Date   Alcohol abuse    Allergy to alpha-gal    Anxiety    Avascular necrosis of bone of right hip (HCC) 02/2023   Bipolar 1 disorder (HCC)    Elevated blood pressure reading with diagnosis of hypertension    Perforated appendicitis  10/09/2018   Perirectal abscess 05/19/2021    Past Surgical History:  Procedure Laterality Date   BACK SURGERY  2008   L4-5 ruptured disc   INCISION AND DRAINAGE PERIRECTAL ABSCESS Left 05/19/2021   LAPAROSCOPIC APPENDECTOMY N/A 10/09/2018   Procedure: APPENDECTOMY LAPAROSCOPIC ATTEMPTED;  Surgeon: Leafy Ro, MD;  Location: [facility] ORS;  Service: General;  Laterality: N/A;   PARTIAL COLECTOMY Right 10/09/2018   Procedure: PARTIAL COLECTOMY;  Surgeon: Leafy Ro, MD;  Location: [facility] ORS;  Service: General;  Laterality: Right;   TOTAL HIP ARTHROPLASTY Right 04/11/2023   Procedure: TOTAL HIP ARTHROPLASTY;  Surgeon: Christena Flake, MD;  Location: [facility] ORS;  Service: Orthopedics;  Laterality: Right;   Family History:  Family History  Adopted: Yes   Family Psychiatric  History: none reported Social History:  Social History   Substance and Sexual Activity  Alcohol Use Yes   Alcohol/week: 12.0 standard drinks of alcohol   Types: 12 Cans of beer per week     Social History   Substance and Sexual Activity  Drug Use Yes   Types: Marijuana   Comment: occassional    Social History   Socioeconomic History   Marital status: Divorced    Spouse name: Not on file   Number of children: 2   Years of education: Not on file   Highest education level: Not on file  Occupational History   Not on file  Tobacco Use   Smoking status: Some Days    Current packs/day: 0.25    Types: Cigarettes   Smokeless tobacco: Never  Vaping  Use   Vaping status: Never Used  Substance and Sexual Activity   Alcohol use: Yes    Alcohol/week: 12.0 standard drinks of alcohol    Types: 12 Cans of beer per week   Drug use: Yes    Types: Marijuana    Comment: occassional   Sexual activity: Not on file  Other Topics Concern   Not on file  Social History Narrative   Lives with sons and fiance   Social Drivers of Health   Financial Resource Strain: Medium Risk (05/24/2023)   Received from [facility]   Overall Financial Resource Strain (CARDIA)    Difficulty of Paying Living Expenses: Somewhat hard  Food Insecurity: No Food Insecurity (06/15/2023)   Hunger Vital Sign    Worried About Running Out of Food in the Last Year: Never true    Ran Out of Food in the Last Year: Never true  Recent Concern: Food Insecurity - Food Insecurity Present (05/24/2023)   Received from [facility]   Hunger Vital Sign    Worried About Running Out of Food in the Last Year: Never true    Ran Out of Food in the Last Year: Often true  Transportation Needs: Unknown (06/15/2023)   PRAPARE - Transportation    Lack of Transportation (Medical): Patient unable to answer    Lack of Transportation (Non-Medical): No  Recent Concern: Transportation Needs - Unmet Transportation Needs (05/24/2023)   Received from [facility] - Transportation    In the past 12 months, has lack of transportation kept you from medical appointments or from getting medications?: Yes    Lack of Transportation (Non-Medical): Yes  Physical Activity: Not on file  Stress: Not on file  Social Connections: Not on file   Additional Social History:                         Sleep: Fair  Appetite:  Good  Current Medications: Current Facility-Administered Medications  Medication Dose Route Frequency Provider Last Rate Last Admin   acetaminophen (TYLENOL) tablet 650 mg  650 mg Oral Q6H PRN Oneta Rack, NP   650 mg at 06/18/23 1445   alum & mag hydroxide-simeth (MAALOX/MYLANTA) 200-200-20 MG/5ML suspension 30 mL  30 mL Oral Q4H PRN Starleen Blue, NP       ARIPiprazole (ABILIFY) tablet 2 mg  2 mg Oral Daily Myriam Forehand, NP   2 mg at 06/18/23 1610   DULoxetine (CYMBALTA) DR capsule 20 mg  20 mg Oral QHS Herman Mell, PA-C   20 mg at 06/18/23 2115   haloperidol (HALDOL) tablet 5 mg  5 mg Oral TID PRN Oneta Rack, NP       [START ON 06/19/2023] LORazepam  (ATIVAN) tablet 1 mg  1 mg Oral Daily Myriam Forehand, NP       magnesium hydroxide (MILK OF MAGNESIA) suspension 30 mL  30 mL Oral Daily PRN Oneta Rack, NP       melatonin tablet 5 mg  5 mg Oral QHS Myriam Forehand, NP   5 mg at 06/18/23 2115   multivitamin with minerals tablet 1 tablet  1 tablet Oral Daily Myriam Forehand, NP   1 tablet at 06/18/23 0820   OLANZapine (ZYPREXA) injection 10 mg  10 mg Intramuscular TID PRN Starleen Blue, NP       thiamine (VITAMIN B1) tablet 100 mg  100 mg  Oral Daily Myriam Forehand, NP   100 mg at 06/18/23 0820   topiramate (TOPAMAX) tablet 50 mg  50 mg Oral Daily Myriam Forehand, NP   50 mg at 06/18/23 0820   traZODone (DESYREL) tablet 50 mg  50 mg Oral QHS PRN Oneta Rack, NP   50 mg at 06/18/23 2115    Lab Results:  No results found for this or any previous visit (from the past 48 hours).   Blood Alcohol level:  Lab Results  Component Value Date   ETH 82 (H) 06/14/2023    Metabolic Disorder Labs: Lab Results  Component Value Date   HGBA1C 4.6 (L) 06/16/2023   MPG 85.32 06/16/2023   MPG 91.06 06/14/2023   No results found for: "PROLACTIN" Lab Results  Component Value Date   CHOL 232 (H) 06/16/2023   TRIG 235 (H) 06/16/2023   HDL 75 06/16/2023   CHOLHDL 3.1 06/16/2023   VLDL 47 (H) 06/16/2023   LDLCALC 110 (H) 06/16/2023   LDLCALC UNABLE TO CALCULATE IF TRIGLYCERIDE OVER 400 mg/dL 40/98/1191    Physical Findings: AIMS:  , ,  ,  ,    CIWA:  CIWA-Ar Total: 4 COWS:     Musculoskeletal: Strength & Muscle Tone: within normal limits Gait & Station: normal Patient leans: N/A  Psychiatric Specialty Exam:  Presentation  General Appearance:  Appropriate for Environment  Eye Contact: Fleeting  Speech: Normal Rate  Speech Volume: Normal  Handedness: Right   Mood and Affect  Mood: Anxious  Affect: Blunt; Restricted   Thought Process  Thought Processes: Linear  Descriptions of Associations:Intact  Orientation:Full  (Time, Place and Person)  Thought Content:Logical  History of Schizophrenia/Schizoaffective disorder:No  Duration of Psychotic Symptoms:Greater than six months  Hallucinations:Hallucinations: None   Ideas of Reference:None  Suicidal Thoughts:Suicidal Thoughts: No   Homicidal Thoughts:Homicidal Thoughts: No    Sensorium  Memory: Immediate Good; Recent Good; Remote Good  Judgment: Fair  Insight: Poor   Executive Functions  Concentration: Fair  Attention Span: Fair  Recall: Good  Fund of Knowledge: Fair  Language: Good   Psychomotor Activity  Psychomotor Activity: Psychomotor Activity: Normal    Assets  Assets: Housing   Sleep  Sleep: Sleep: Good     Physical Exam: Physical Exam Vitals and nursing note reviewed.  Constitutional:      Appearance: Normal appearance.  HENT:     Head: Normocephalic and atraumatic.  Eyes:     Pupils: Pupils are equal, round, and reactive to light.  Pulmonary:     Effort: Pulmonary effort is normal.  Musculoskeletal:        General: Normal range of motion.     Cervical back: Normal range of motion.  Skin:    General: Skin is dry.  Neurological:     General: No focal deficit present.     Mental Status: He is alert and oriented to person, place, and time. Mental status is at baseline.  Psychiatric:        Attention and Perception: Attention and perception normal.        Mood and Affect: Mood is anxious. Affect is flat.        Speech: Speech normal.        Behavior: Behavior normal. Behavior is cooperative.        Thought Content: Thought content normal.        Cognition and Memory: Cognition and memory normal.        Judgment: Judgment is impulsive.  Comments: Guarded, constricted     Review of Systems  Musculoskeletal:  Positive for joint pain.  Neurological:  Positive for tremors.  Psychiatric/Behavioral:  Positive for substance abuse. The patient is nervous/anxious.   All other systems  reviewed and are negative.  Blood pressure 129/81, pulse 91, temperature 98.6 F (37 C), resp. rate 17, height 5\' 8"  (1.727 m), weight 103.4 kg, SpO2 100%. Body mass index is 34.67 kg/m.   Treatment Plan Summary:  1.    Safety and Monitoring:   --  Involuntary admission to inpatient psychiatric unit for safety, stabilization and treatment -- Daily contact with patient to assess and evaluate symptoms and progress in treatment -- Patient's case to be discussed in multi-disciplinary team meeting -- Observation Level : q15 minute checks -- Vital signs:  q12 hours -- Precautions: suicide   2. Psychiatric Diagnoses and Treatment:   06/18/2023 -- Increase Abilify to 5 mg daily for mood stabilization/depression adjunct  -- Continue Cymbalta 20 mg HS for depressive sxs -- Continue Topiramate 50 mg daily to support substance use recovery and assist with mood stabilization.  06/17/2023  -- Continue Abilify 2 mg daily for mood stabilization   -- Switch Cymbalta 20 mg daily to HS for depressive sxs (pt complaints of AM somnolence)   -- Continue Topiramate 50 mg daily to support substance use recovery and assist with mood stabilization.  --  The risks/benefits/side-effects/alternatives to this medication were discussed in detail with the patient and time was given for questions. The patient consents to medication trial. QTC 468 on 2/21.   -- Encouraged patient to participate in unit milieu and in scheduled group therapies  -- Short Term Goals: Ability to identify changes in lifestyle to reduce recurrence of condition will improve, Ability to verbalize feelings will improve, Ability to disclose and discuss suicidal ideas, Ability to demonstrate self-control will improve, Ability to identify and develop effective coping behaviors will improve, Ability to maintain clinical measurements within normal limits will improve, Compliance with prescribed medications will improve, and Ability to identify  triggers associated with substance abuse/mental health issues will improve -- Long Term Goals: Improvement in symptoms so as ready for discharge        3. Medical Issues Being Addressed:    Alcohol use disorder  -- Continue CIWA protocol                Tobacco Use Disorder             -- Nicotine gum 2mg  PRN             -- Smoking cessation encouraged   4. Discharge Planning:   -- Social work and case management to assist with discharge planning and identification of hospital follow-up needs prior to discharge -- Estimated LOS: 5-7 days -- Discharge Concerns: Need to establish a safety plan; Medication compliance and effectiveness -- Discharge Goals: Return home with outpatient referrals for mental health follow-up including medication management/psychotherapy   Paulene Floor, PA-C 06/18/2023, 10:27 PM

## 2023-06-19 DIAGNOSIS — F302 Manic episode, severe with psychotic symptoms: Secondary | ICD-10-CM | POA: Diagnosis not present

## 2023-06-19 MED ORDER — MELATONIN 5 MG PO TABS
5.0000 mg | ORAL_TABLET | Freq: Every day | ORAL | 0 refills | Status: AC
Start: 2023-06-19 — End: ?
  Filled 2023-06-19: qty 30, 30d supply, fill #0

## 2023-06-19 MED ORDER — DULOXETINE HCL 20 MG PO CPEP
20.0000 mg | ORAL_CAPSULE | Freq: Every day | ORAL | 0 refills | Status: DC
  Filled 2023-06-19: qty 30, 30d supply, fill #0

## 2023-06-19 MED ORDER — TOPIRAMATE 50 MG PO TABS
50.0000 mg | ORAL_TABLET | Freq: Every day | ORAL | 0 refills | Status: DC
  Filled 2023-06-19: qty 30, 30d supply, fill #0

## 2023-06-19 MED ORDER — ARIPIPRAZOLE 5 MG PO TABS
5.0000 mg | ORAL_TABLET | Freq: Every day | ORAL | 0 refills | Status: DC
  Filled 2023-06-19: qty 30, 30d supply, fill #0

## 2023-06-19 NOTE — Group Note (Signed)
 [facility] LCSW Group Therapy Note   Group Date: 06/19/2023 Start Time: 1400 End Time: 1500  Type of Therapy and Topic:  Group Therapy:  Feelings around Relapse and Recovery  Participation Level:  Active    Description of Group:    Patients in this group will discuss emotions they experience before and after a relapse. They will process how experiencing these feelings, or avoidance of experiencing them, relates to having a relapse. Facilitator will guide patients to explore emotions they have related to recovery. Patients will be encouraged to process which emotions are more powerful. They will be guided to discuss the emotional reaction significant others in their lives may have to patients' relapse or recovery. Patients will be assisted in exploring ways to respond to the emotions of others without this contributing to a relapse.  Therapeutic Goals: Patient will identify two or more emotions that lead to relapse for them:  Patient will identify two emotions that result when they relapse:  Patient will identify two emotions related to recovery:  Patient will demonstrate ability to communicate their needs through discussion and/or role plays.   Summary of Patient Progress: Patient was present for the entirety of the group process. He was actively involved in the conversation sharing his own struggles with alcohol use. He reported issues in communicating with his family as a trigger for relapse. Pt appeared to have some insight into himself. He appeared open and receptive to feedback/comments from both his peers and facilitator.    Therapeutic Modalities:   Cognitive Behavioral Therapy Solution-Focused Therapy Assertiveness Training Relapse Prevention Therapy   Glenis Smoker, LCSW

## 2023-06-19 NOTE — Progress Notes (Signed)
 Pt in milieu, selective peers interaction, attended and participated in wrapup.  Endorsed anxiety rated mild to moderate, denied SI, AVH and depression.  Reported spoke with mother, she is supportive.      06/18/23 2200  Psych Admission Type (Psych Patients Only)  Admission Status Involuntary  Psychosocial Assessment  Patient Complaints None  Eye Contact Fair  Facial Expression Anxious  Affect Appropriate to circumstance  Speech Logical/coherent  Interaction Assertive  Motor Activity Slow  Appearance/Hygiene Unremarkable  Behavior Characteristics Cooperative  Mood Pleasant  Thought Process  Coherency WDL  Content WDL  Delusions None reported or observed  Perception WDL  Hallucination None reported or observed  Judgment Limited  Confusion None  Danger to Self  Current suicidal ideation? Denies  Agreement Not to Harm Self Yes  Description of Agreement Verbal  Danger to Others  Danger to Others None reported or observed    Goal: Mental status will improve Outcome: Progressing Goal: Verbalization of understanding the information provided will improve Outcome: Progressing   Problem: Activity: Goal: Interest or engagement in activities will improve Outcome: Progressing Goal: Sleeping patterns will improve Outcome: Progressing   Problem: Coping: Goal: Ability to verbalize frustrations and anger appropriately will improve Outcome: Progressing Goal: Ability to demonstrate self-control will improve Outcome: Progressing   Problem: Health Behavior/Discharge Planning: Goal: Identification of resources available to assist in meeting health care needs will improve Outcome: Progressing Goal: Compliance with treatment plan for underlying cause of condition will improve Outcome: Progressing   Problem: Physical Regulation: Goal: Ability to maintain clinical measurements within normal limits will improve Outcome: Progressing   Problem: Safety: Goal: Periods of time without  injury will increase Outcome: Progressing   Problem: Education: Goal: Understanding of discharge needs will improve Outcome: Progressing   Problem: Health Behavior/Discharge Planning: Goal: Ability to identify changes in lifestyle to reduce recurrence of condition will improve Outcome: Progressing Goal: Identification of resources available to assist in meeting health care needs will improve Outcome: Progressing   Problem: Physical Regulation: Goal: Complications related to the disease process, condition or treatment will be avoided or minimized Outcome: Progressing   Problem: Safety: Goal: Ability to remain free from injury will improve Outcome: Progressing   Problem: Coping: Goal: Coping ability will improve Outcome: Progressing   Problem: Self-Concept: Goal: Ability to disclose and discuss suicidal ideas will improve Outcome: Progressing Goal: Will verbalize positive feelings about self Outcome: Progressing Note:     Problem: Self-Concept: Goal: Ability to identify factors that promote anxiety will improve Outcome: Progressing Goal: Level of anxiety will decrease Outcome: Progressing Goal: Ability to modify response to factors that promote anxiety will improve Outcome: Progressing

## 2023-06-19 NOTE — Progress Notes (Signed)
 [facility] MD Progress Note  06/19/2023 5:45 PM Vernon Allen  MRN:  [id number]  43 year old Caucasian male currently IVC with a long-standing history of alcohol dependence and multiple prior admissions for detoxification. IVC petition filed by his mother, d/t escalating alcohol abuse and recent suicidal remarks. Reportedly pt sent a text to his son stating that he wanted to end his life, recently threatened to use a firearm to kill himself, has sent pictures to his girlfriend of himself holding a knife to his wrist. Reportedly pt presented with inconsistencies in story upon admission.  Subjective:   Patient's case discussed with treatment team, all vitals and staff notes were reviewed.  No behavioral issues reported overnight. Pt seen for reassessment, reports that he is doing "ok". Reports good sleep and appetite. States that his son visited with him last night and his girlfriend visited the prior night. Both visits went well. Reports that he feels "better". Continues to remain discharge focused, siting that he would like to attend a kids show with his girlfriend and her 2 yr old child this upcoming Friday. Denies withdrawal sxs. Denies depressed mood, anxiety, SI/HI and AVH. Alert and oriented x4. Affect is euthymic, less guarded. Smiling at times, which is an improvement from prior flat affect. Insight and judgement fair. Med complaint. Interacting appropriately with others, attending groups. No overt sxs of psychosis, depression, mania.   SW team reports that they made contact with the pts mother and that she relays that the pt appears to be doing well and that she is agreeable with discharge. No weapons in home.     Principal Problem: Bipolar I disorder, single manic episode, severe, with psychosis (HCC) Diagnosis: Principal Problem:   Bipolar I disorder, single manic episode, severe, with psychosis (HCC) Active Problems:   EtOH dependence (HCC)   Suicide ideation   Substance induced mood  disorder (HCC)  Total Time spent with patient: 1 hour  Past Psychiatric History: see below  Past Medical History:  Past Medical History:  Diagnosis Date   Alcohol abuse    Allergy to alpha-gal    Anxiety    Avascular necrosis of bone of right hip (HCC) 02/2023   Bipolar 1 disorder (HCC)    Elevated blood pressure reading with diagnosis of hypertension    Perforated appendicitis 10/09/2018   Perirectal abscess 05/19/2021    Past Surgical History:  Procedure Laterality Date   BACK SURGERY  2008   L4-5 ruptured disc   INCISION AND DRAINAGE PERIRECTAL ABSCESS Left 05/19/2021   LAPAROSCOPIC APPENDECTOMY N/A 10/09/2018   Procedure: APPENDECTOMY LAPAROSCOPIC ATTEMPTED;  Surgeon: Leafy Ro, MD;  Location: [facility] ORS;  Service: General;  Laterality: N/A;   PARTIAL COLECTOMY Right 10/09/2018   Procedure: PARTIAL COLECTOMY;  Surgeon: Leafy Ro, MD;  Location: [facility] ORS;  Service: General;  Laterality: Right;   TOTAL HIP ARTHROPLASTY Right 04/11/2023   Procedure: TOTAL HIP ARTHROPLASTY;  Surgeon: Christena Flake, MD;  Location: [facility] ORS;  Service: Orthopedics;  Laterality: Right;   Family History:  Family History  Adopted: Yes   Family Psychiatric  History: none reported Social History:  Social History   Substance and Sexual Activity  Alcohol Use Yes   Alcohol/week: 12.0 standard drinks of alcohol   Types: 12 Cans of beer per week     Social History   Substance and Sexual Activity  Drug Use Yes   Types: Marijuana   Comment: occassional    Social History   Socioeconomic History   Marital  status: Divorced    Spouse name: Not on file   Number of children: 2   Years of education: Not on file   Highest education level: Not on file  Occupational History   Not on file  Tobacco Use   Smoking status: Some Days    Current packs/day: 0.25    Types: Cigarettes   Smokeless tobacco: Never  Vaping Use   Vaping status: Never Used  Substance and Sexual Activity   Alcohol  use: Yes    Alcohol/week: 12.0 standard drinks of alcohol    Types: 12 Cans of beer per week   Drug use: Yes    Types: Marijuana    Comment: occassional   Sexual activity: Not on file  Other Topics Concern   Not on file  Social History Narrative   Lives with sons and fiance   Social Drivers of Health   Financial Resource Strain: Medium Risk (05/24/2023)   Received from [facility]   Overall Financial Resource Strain (CARDIA)    Difficulty of Paying Living Expenses: Somewhat hard  Food Insecurity: No Food Insecurity (06/15/2023)   Hunger Vital Sign    Worried About Running Out of Food in the Last Year: Never true    Ran Out of Food in the Last Year: Never true  Recent Concern: Food Insecurity - Food Insecurity Present (05/24/2023)   Received from [facility]   Hunger Vital Sign    Worried About Running Out of Food in the Last Year: Never true    Ran Out of Food in the Last Year: Often true  Transportation Needs: Unknown (06/15/2023)   PRAPARE - Transportation    Lack of Transportation (Medical): Patient unable to answer    Lack of Transportation (Non-Medical): No  Recent Concern: Transportation Needs - Unmet Transportation Needs (05/24/2023)   Received from [facility] - Transportation    In the past 12 months, has lack of transportation kept you from medical appointments or from getting medications?: Yes    Lack of Transportation (Non-Medical): Yes  Physical Activity: Not on file  Stress: Not on file  Social Connections: Not on file   Additional Social History:                         Sleep: Fair  Appetite:  Good  Current Medications: Current Facility-Administered Medications  Medication Dose Route Frequency Provider Last Rate Last Admin   acetaminophen (TYLENOL) tablet 650 mg  650 mg Oral Q6H PRN Oneta Rack, NP   650 mg at 06/18/23 1445   alum & mag hydroxide-simeth (MAALOX/MYLANTA)  200-200-20 MG/5ML suspension 30 mL  30 mL Oral Q4H PRN Starleen Blue, NP       ARIPiprazole (ABILIFY) tablet 5 mg  5 mg Oral Daily Jaycen Vercher, PA-C   5 mg at 06/19/23 0840   DULoxetine (CYMBALTA) DR capsule 20 mg  20 mg Oral QHS Jayne Peckenpaugh, PA-C   20 mg at 06/18/23 2115   haloperidol (HALDOL) tablet 5 mg  5 mg Oral TID PRN Oneta Rack, NP       magnesium hydroxide (MILK OF MAGNESIA) suspension 30 mL  30 mL Oral Daily PRN Oneta Rack, NP       melatonin tablet 5 mg  5 mg Oral QHS Myriam Forehand, NP   5 mg at 06/18/23 2115   multivitamin with minerals tablet 1 tablet  1 tablet Oral  Daily Myriam Forehand, NP   1 tablet at 06/19/23 0840   OLANZapine (ZYPREXA) injection 10 mg  10 mg Intramuscular TID PRN Starleen Blue, NP       thiamine (VITAMIN B1) tablet 100 mg  100 mg Oral Daily Myriam Forehand, NP   100 mg at 06/19/23 0839   topiramate (TOPAMAX) tablet 50 mg  50 mg Oral Daily Myriam Forehand, NP   50 mg at 06/19/23 0840   traZODone (DESYREL) tablet 50 mg  50 mg Oral QHS PRN Oneta Rack, NP   50 mg at 06/18/23 2115    Lab Results:  No results found for this or any previous visit (from the past 48 hours).   Blood Alcohol level:  Lab Results  Component Value Date   ETH 82 (H) 06/14/2023    Metabolic Disorder Labs: Lab Results  Component Value Date   HGBA1C 4.6 (L) 06/16/2023   MPG 85.32 06/16/2023   MPG 91.06 06/14/2023   No results found for: "PROLACTIN" Lab Results  Component Value Date   CHOL 232 (H) 06/16/2023   TRIG 235 (H) 06/16/2023   HDL 75 06/16/2023   CHOLHDL 3.1 06/16/2023   VLDL 47 (H) 06/16/2023   LDLCALC 110 (H) 06/16/2023   LDLCALC UNABLE TO CALCULATE IF TRIGLYCERIDE OVER 400 mg/dL 81/19/1478    Physical Findings: AIMS:  , ,  ,  ,    CIWA:  CIWA-Ar Total: 1 COWS:     Musculoskeletal: Strength & Muscle Tone: within normal limits Gait & Station: normal Patient leans: N/A  Psychiatric Specialty Exam:  Presentation  General  Appearance:  Appropriate for Environment  Eye Contact: Fleeting  Speech: Normal Rate  Speech Volume: Normal  Handedness: Right   Mood and Affect  Mood: Anxious  Affect: Blunt; Restricted   Thought Process  Thought Processes: Linear  Descriptions of Associations:Intact  Orientation:Full (Time, Place and Person)  Thought Content:Logical  History of Schizophrenia/Schizoaffective disorder:No  Duration of Psychotic Symptoms:Greater than six months  Hallucinations:Hallucinations: None   Ideas of Reference:None  Suicidal Thoughts:Suicidal Thoughts: No   Homicidal Thoughts:Homicidal Thoughts: No    Sensorium  Memory: Immediate Good; Recent Good; Remote Good  Judgment: Fair  Insight: Poor   Executive Functions  Concentration: Fair  Attention Span: Fair  Recall: Good  Fund of Knowledge: Fair  Language: Good   Psychomotor Activity  Psychomotor Activity: Psychomotor Activity: Normal    Assets  Assets: Housing   Sleep  Sleep: Sleep: Good     Physical Exam: Physical Exam Vitals and nursing note reviewed.  Constitutional:      Appearance: Normal appearance.  HENT:     Head: Normocephalic and atraumatic.  Eyes:     Pupils: Pupils are equal, round, and reactive to light.  Pulmonary:     Effort: Pulmonary effort is normal.  Musculoskeletal:        General: Normal range of motion.     Cervical back: Normal range of motion.  Skin:    General: Skin is dry.  Neurological:     General: No focal deficit present.     Mental Status: He is alert and oriented to person, place, and time. Mental status is at baseline.  Psychiatric:        Attention and Perception: Attention and perception normal.        Mood and Affect: Mood and affect normal.        Speech: Speech normal.        Behavior: Behavior  normal. Behavior is cooperative.        Thought Content: Thought content normal.        Cognition and Memory: Cognition and memory  normal.        Judgment: Judgment is impulsive.    Review of Systems  Musculoskeletal:  Positive for joint pain.  Psychiatric/Behavioral:  Positive for substance abuse.   All other systems reviewed and are negative.  Blood pressure 125/62, pulse 88, temperature (!) 97.3 F (36.3 C), resp. rate 17, height 5\' 8"  (1.727 m), weight 103.4 kg, SpO2 97%. Body mass index is 34.67 kg/m.   Treatment Plan Summary:  1.    Safety and Monitoring:   --  Involuntary admission to inpatient psychiatric unit for safety, stabilization and treatment -- Daily contact with patient to assess and evaluate symptoms and progress in treatment -- Patient's case to be discussed in multi-disciplinary team meeting -- Observation Level : q15 minute checks -- Vital signs:  q12 hours -- Precautions: suicide   2. Psychiatric Diagnoses and Treatment:   06/19/2023 -- Continue Abilify to 5 mg daily for mood stabilization/depression adjunct  -- Continue Cymbalta 20 mg HS for depressive sxs -- Continue Topiramate 50 mg daily to support substance use recovery and assist with mood stabilizat  06/18/2023 -- Increase Abilify to 5 mg daily for mood stabilization/depression adjunct  -- Continue Cymbalta 20 mg HS for depressive sxs -- Continue Topiramate 50 mg daily to support substance use recovery and assist with mood stabilization.  06/17/2023  -- Continue Abilify 2 mg daily for mood stabilization   -- Switch Cymbalta 20 mg daily to HS for depressive sxs (pt complaints of AM somnolence)   -- Continue Topiramate 50 mg daily to support substance use recovery and assist with mood stabilization.  --  The risks/benefits/side-effects/alternatives to this medication were discussed in detail with the patient and time was given for questions. The patient consents to medication trial. QTC 468 on 2/21.   -- Encouraged patient to participate in unit milieu and in scheduled group therapies  -- Short Term Goals: Ability to identify  changes in lifestyle to reduce recurrence of condition will improve, Ability to verbalize feelings will improve, Ability to disclose and discuss suicidal ideas, Ability to demonstrate self-control will improve, Ability to identify and develop effective coping behaviors will improve, Ability to maintain clinical measurements within normal limits will improve, Compliance with prescribed medications will improve, and Ability to identify triggers associated with substance abuse/mental health issues will improve -- Long Term Goals: Improvement in symptoms so as ready for discharge        3. Medical Issues Being Addressed:    Alcohol use disorder  -- Continue CIWA protocol                Tobacco Use Disorder             -- Nicotine gum 2mg  PRN             -- Smoking cessation encouraged   4. Discharge Planning:   -- Social work and case management to assist with discharge planning and identification of hospital follow-up needs prior to discharge -- Estimated LOS: 5-7 days -- Discharge Concerns: Need to establish a safety plan; Medication compliance and effectiveness -- Discharge Goals: Return home with outpatient referrals for mental health follow-up including medication management/psychotherapy   Paulene Floor, PA-C 06/19/2023, 5:45 PM

## 2023-06-19 NOTE — Progress Notes (Signed)
   06/19/23 0941  Charting Type  Charting Type Shift assessment  Safety Check Verification  Has the RN verified the 15 minute safety check completion? Yes  Neurological  Neuro (WDL) WDL  HEENT  HEENT (WDL) WDL  Teeth Intact  Tongue Pink;Moist  Mucous Membrane(s) Moist;Pink  Voice Clear  Respiratory  Respiratory (WDL) WDL  Cardiac  Cardiac (WDL) WDL  Pulse Regular  Antiarrhythmic device No  Vascular  Vascular (WDL) WDL  Integumentary  Integumentary (WDL) WDL  Braden Scale (Ages 8 and up)  Sensory Perceptions 4  Moisture 4  Activity 4  Mobility 3  Nutrition 3  Friction and Shear 3  Braden Scale Score 21  Musculoskeletal  Musculoskeletal (WDL) WDL  Assistive Device None  Gastrointestinal  Gastrointestinal (WDL) WDL  GU Assessment  Genitourinary (WDL) WDL  Neurological  Level of Consciousness Alert

## 2023-06-19 NOTE — Group Note (Signed)
 Date:  06/19/2023 Time:  10:15 AM  Group Topic/Focus:  Making Healthy Choices:   The focus of this group is to help patients identify negative/unhealthy choices they were using prior to admission and identify positive/healthier coping strategies to replace them upon discharge. Self Care:   The focus of this group is to help patients understand the importance of self-care in order to improve or restore emotional, physical, spiritual, interpersonal, and financial health. Spirituality:   The focus of this group is to discuss how one's spirituality can aide in recovery.    Participation Level:  Active  Participation Quality:  Appropriate  Affect:  Appropriate  Cognitive:  Appropriate  Insight: Appropriate  Engagement in Group:  Developing/Improving and Supportive  Modes of Intervention:  Activity, Discussion, and Education  Additional Comments:   Rosaura Carpenter 06/19/2023, 10:15 AM

## 2023-06-19 NOTE — Group Note (Signed)
 Date:  06/19/2023 Time:  11:27 PM  Group Topic/Focus:  Wrap-Up Group:   The focus of this group is to help patients review their daily goal of treatment and discuss progress on daily workbooks.    Participation Level:  Active  Participation Quality:  Appropriate, Attentive, Sharing, and Supportive  Affect:  Appropriate  Cognitive:  Appropriate  Insight: Appropriate and Good  Engagement in Group:  Engaged  Modes of Intervention:  Discussion and Support  Additional Comments:     Belva Crome 06/19/2023, 11:27 PM

## 2023-06-20 ENCOUNTER — Other Ambulatory Visit: Payer: Self-pay

## 2023-06-20 DIAGNOSIS — F302 Manic episode, severe with psychotic symptoms: Secondary | ICD-10-CM | POA: Diagnosis not present

## 2023-06-20 NOTE — Plan of Care (Signed)
 Pt discharged from unit with items from locker room, paperwork, and prescriptions.Pt escorted to the medical mall exit to his ride.

## 2023-06-20 NOTE — Discharge Summary (Signed)
 Physician Discharge Summary Note  Patient:  Vernon Allen is an 43 y.o., male MRN:  [id number] DOB:  March 05, 1981 Patient phone:  [phone] (home)  Patient address:   [address] [place] [address],  Total Time spent with patient: 30 minutes  Date of Admission:  06/15/2023 Date of Discharge: 06/20/2023  Reason for Admission:   43 year old Caucasian male currently IVC with a long-standing history of alcohol dependence and multiple prior admissions for detoxification. IVC petition filed by his mother, d/t escalating alcohol abuse and recent suicidal remarks. Reportedly pt sent a text to his son stating that he wanted to end his life, recently threatened to use a firearm to kill himself, has sent pictures to his girlfriend of himself holding a knife to his wrist. Reportedly pt presented with inconsistencies in story upon admission.   Principal Problem: Bipolar I disorder, single manic episode, severe, with psychosis (HCC) Discharge Diagnoses: Principal Problem:   Bipolar I disorder, single manic episode, severe, with psychosis (HCC) Active Problems:   EtOH dependence (HCC)   Suicide ideation   Substance induced mood disorder (HCC)   Past Psychiatric History:  DX: bipolar disorder Patient denied current outpatient psychiatric provider at admission   Past Medical History:  Past Medical History:  Diagnosis Date   Alcohol abuse    Allergy to alpha-gal    Anxiety    Avascular necrosis of bone of right hip (HCC) 02/2023   Bipolar 1 disorder (HCC)    Elevated blood pressure reading with diagnosis of hypertension    Perforated appendicitis 10/09/2018   Perirectal abscess 05/19/2021    Past Surgical History:  Procedure Laterality Date   BACK SURGERY  2008   L4-5 ruptured disc   INCISION AND DRAINAGE PERIRECTAL ABSCESS Left 05/19/2021   LAPAROSCOPIC APPENDECTOMY N/A 10/09/2018   Procedure: APPENDECTOMY LAPAROSCOPIC ATTEMPTED;  Surgeon: Leafy Ro, MD;  Location:  [facility] ORS;  Service: General;  Laterality: N/A;   PARTIAL COLECTOMY Right 10/09/2018   Procedure: PARTIAL COLECTOMY;  Surgeon: Leafy Ro, MD;  Location: [facility] ORS;  Service: General;  Laterality: Right;   TOTAL HIP ARTHROPLASTY Right 04/11/2023   Procedure: TOTAL HIP ARTHROPLASTY;  Surgeon: Christena Flake, MD;  Location: [facility] ORS;  Service: Orthopedics;  Laterality: Right;   Family History:  Family History  Adopted: Yes   Family Psychiatric  History:  None reported  Social History:  Social History   Substance and Sexual Activity  Alcohol Use Yes   Alcohol/week: 12.0 standard drinks of alcohol   Types: 12 Cans of beer per week     Social History   Substance and Sexual Activity  Drug Use Yes   Types: Marijuana   Comment: occassional    Social History   Socioeconomic History   Marital status: Divorced    Spouse name: Not on file   Number of children: 2   Years of education: Not on file   Highest education level: Not on file  Occupational History   Not on file  Tobacco Use   Smoking status: Some Days    Current packs/day: 0.25    Types: Cigarettes   Smokeless tobacco: Never  Vaping Use   Vaping status: Never Used  Substance and Sexual Activity   Alcohol use: Yes    Alcohol/week: 12.0 standard drinks of alcohol    Types: 12 Cans of beer per week   Drug use: Yes    Types: Marijuana    Comment: occassional   Sexual activity: Not  on file  Other Topics Concern   Not on file  Social History Narrative   Lives with sons and fiance   Social Drivers of Health   Financial Resource Strain: Medium Risk (05/24/2023)   Received from [facility]   Overall Financial Resource Strain (CARDIA)    Difficulty of Paying Living Expenses: Somewhat hard  Food Insecurity: No Food Insecurity (06/15/2023)   Hunger Vital Sign    Worried About Running Out of Food in the Last Year: Never true    Ran Out of Food in the Last Year: Never true  Recent Concern: Food  Insecurity - Food Insecurity Present (05/24/2023)   Received from [facility]   Hunger Vital Sign    Worried About Running Out of Food in the Last Year: Never true    Ran Out of Food in the Last Year: Often true  Transportation Needs: Unknown (06/15/2023)   PRAPARE - Transportation    Lack of Transportation (Medical): Patient unable to answer    Lack of Transportation (Non-Medical): No  Recent Concern: Transportation Needs - Unmet Transportation Needs (05/24/2023)   Received from [facility] - Transportation    In the past 12 months, has lack of transportation kept you from medical appointments or from getting medications?: Yes    Lack of Transportation (Non-Medical): Yes  Physical Activity: Not on file  Stress: Not on file  Social Connections: Not on file    Hospital Course:   During the course of hospitalization, pt received daily multiple modalities of treatments consisting of Psychopharmacology, individual, group, psychoeducational, recreational, milieu therapy, including case management to coordinate pts inpatient and outpatient care and in concert with weekly treatment team meetings. Discharge planning was initiated on the day of admission to ensure a safe discharge. The presenting symptoms were closely monitored and medications were started as indicated. There were no complications. The principal reasons for hospitalization consisted of bipolar disorder, suicidal ideations, alcohol use disorder detox   Medications addressing the principal problem were initiated with improvement in severity sufficient to discharge to a lower level of care. Patient was started on CIWA protocol for safe detox. Abilify 2mg  daily for mood stabilization,  and was uptitrated to 10 mg daily, Topiramate 50 mg daily to support substance use recovery and assist with mood stabilization,  Cymbalta 20 mg daily initiated specifically to address chronic pain and depression  during stay on the unit. Pt complaint of somnolence in the AM, Cymbalta 20 mg was switched to HS, with resolution of daytime somnolence, and continued during course of stay on the unit. Patient was also given PRN medications Vistaril as needed for anxiety, trazodone 50mg  HS for insomnia.  It is intended for the outpatient provider to determine whether to continue these medications, or if these medication needs to be titrated for continued outpatient therapy.  All identified psychiatric, general medical/surgical psychosocial obstacles to discharge were addressed. Patient tolerated these medications with no noted side effects. All these medications were titrated to discharge levels (Please see discharge medications below). Patient showed slow but steady and sustained symptomatic improvement before discharge. The patient denied suicidal, homicidal ideations and hallucinations. Family session held to determine baseline behaviors and for safe discharge plan. Contacted the pts mother and son, Chip Boer [phone], reports that the pt is doing much better, at baseline, mood significantly improved, "he's not as snappy, he's calm, not raising his voice". No concerns for safety at dc. All weapons have been removed from  home.   On the day of discharge 06/20/2023, following sustained improvement in the affect of this patient, continued report of euthymic mood, repeated denial of suicidal, homicidal and other violent ideations, adequate interaction with peers, active participation in groups while on the unit, and denial of adverse reactions from the medications, the treatment team decided that Kelli Hope was stable for discharge back  home with scheduled mental health treatment as below. A comprehensive risk assessment was done prior to discharge and shows that patient is at low risk for suicide or violence and will continue to be if patient complies with the treatment recommendations, medications and therapy. Patient  agrees to call Crisis Services, 911 and/or return to the ED if safety cannot be maintained outside the hospital setting. Discharge medications reviewed with patient, explanation of indication, risks/benefits and side effects profiles. The patient verbalized understanding and is in agreement with the discharge plan.  Physical Findings: AIMS:  , ,  ,  ,    CIWA:  CIWA-Ar Total: 1 COWS:     Musculoskeletal: Strength & Muscle Tone: within normal limits Gait & Station:  recent right hip surgery, limping  Patient leans: Right   Psychiatric Specialty Exam:  Presentation  General Appearance:  Appropriate for Environment  Eye Contact: Good  Speech: Normal Rate  Speech Volume: Normal  Handedness: Right   Mood and Affect  Mood: Euthymic  Affect: Appropriate   Thought Process  Thought Processes: Coherent  Descriptions of Associations:Intact  Orientation:Full (Time, Place and Person)  Thought Content:Logical  History of Schizophrenia/Schizoaffective disorder:No  Duration of Psychotic Symptoms:Greater than six months  Hallucinations:Hallucinations: None  Ideas of Reference:None  Suicidal Thoughts:Suicidal Thoughts: No  Homicidal Thoughts:Homicidal Thoughts: No   Sensorium  Memory: Immediate Good; Recent Good; Remote Good  Judgment: Fair  Insight: Fair   Art therapist  Concentration: Good  Attention Span: Good  Recall: Good  Fund of Knowledge: Fair  Language: Good   Psychomotor Activity  Psychomotor Activity: Psychomotor Activity: Normal   Assets  Assets: Communication Skills; Desire for Improvement; Financial Resources/Insurance; Housing; Leisure Time; Transportation; Social Support   Sleep  Sleep: Sleep: Good    Physical Exam: Physical Exam Vitals and nursing note reviewed.  HENT:     Head: Normocephalic and atraumatic.     Nose: Nose normal.     Mouth/Throat:     Mouth: Mucous membranes are moist.  Eyes:      Extraocular Movements: Extraocular movements intact.  Pulmonary:     Effort: Pulmonary effort is normal.  Skin:    General: Skin is warm and dry.  Neurological:     General: No focal deficit present.     Mental Status: He is alert and oriented to person, place, and time. Mental status is at baseline.  Psychiatric:        Attention and Perception: Attention and perception normal.        Mood and Affect: Mood and affect normal.        Speech: Speech normal.        Behavior: Behavior normal. Behavior is cooperative.        Thought Content: Thought content normal.        Cognition and Memory: Cognition and memory normal.     Comments: Judgement fair     Review of Systems  Psychiatric/Behavioral:  Positive for substance abuse.   All other systems reviewed and are negative.  Blood pressure 127/73, pulse 94, temperature 98.1 F (36.7 C), resp. rate 16, height 5'  8" (1.727 m), weight 103.4 kg, SpO2 98%. Body mass index is 34.67 kg/m.   Social History   Tobacco Use  Smoking Status Some Days   Current packs/day: 0.25   Types: Cigarettes  Smokeless Tobacco Never   Tobacco Cessation:  A prescription for an FDA-approved tobacco cessation medication was offered at discharge and the patient refused   Blood Alcohol level:  Lab Results  Component Value Date   ETH 82 (H) 06/14/2023    Metabolic Disorder Labs:  Lab Results  Component Value Date   HGBA1C 4.6 (L) 06/16/2023   MPG 85.32 06/16/2023   MPG 91.06 06/14/2023   No results found for: "PROLACTIN" Lab Results  Component Value Date   CHOL 232 (H) 06/16/2023   TRIG 235 (H) 06/16/2023   HDL 75 06/16/2023   CHOLHDL 3.1 06/16/2023   VLDL 47 (H) 06/16/2023   LDLCALC 110 (H) 06/16/2023   LDLCALC UNABLE TO CALCULATE IF TRIGLYCERIDE OVER 400 mg/dL 16/01/9603    See Psychiatric Specialty Exam and Suicide Risk Assessment completed by Attending Physician prior to discharge.  Discharge destination:  Home  Is patient on  multiple antipsychotic therapies at discharge:  No   Has Patient had three or more failed trials of antipsychotic monotherapy by history:  No  Recommended Plan for Multiple Antipsychotic Therapies: NA  Discharge Instructions     Diet - low sodium heart healthy   Complete by: As directed    Increase activity slowly   Complete by: As directed       Allergies as of 06/20/2023       Reactions   Beef-derived Drug Products Anaphylaxis   Keflex [cephalexin] Anaphylaxis   Mobic [meloxicam] Anaphylaxis   Pork-derived Products Anaphylaxis   Bc Fast Pain Relief [aspirin-salicylamide-caffeine] Hives   Ciprofloxacin Hives   Esomeprazole Magnesium Hives        Medication List     STOP taking these medications    NYQUIL PO   ondansetron 4 MG tablet Commonly known as: ZOFRAN       TAKE these medications      Indication  ARIPiprazole 5 MG tablet Commonly known as: ABILIFY Take 1 tablet (5 mg total) by mouth daily.  Indication: MIXED BIPOLAR AFFECTIVE DISORDER   DULoxetine 20 MG capsule Commonly known as: CYMBALTA Take 1 capsule (20 mg total) by mouth at bedtime.  Indication: Generalized Anxiety Disorder   melatonin 5 MG Tabs Take 1 tablet (5 mg total) by mouth at bedtime.  Indication: Trouble Sleeping   oxyCODONE 5 MG immediate release tablet Commonly known as: Oxy IR/ROXICODONE Take 1-2 tablets (5-10 mg total) by mouth every 4 (four) hours as needed for moderate pain (pain score 4-6). What changed:  how much to take when to take this  Indication: Acute Pain   topiramate 50 MG tablet Commonly known as: TOPAMAX Take 1 tablet (50 mg total) by mouth daily.  Indication: Abuse or Misuse of Alcohol        Follow-up Information     [facility]. Go to.   Specialty: Behavioral Health Why: In person assessment is 06/25/23 at 10 AM. Medication management appointment will be same day. Contact information: [address] [place]  [address] [phone]                 Follow-up recommendations:   # It is recommended to the patient to continue psychiatric medications as prescribed, after discharge from the hospital.   # It is recommended to the patient  to follow up with your outpatient psychiatric provider and PCP. # It was discussed with the patient, the impact of alcohol, drugs, tobacco have been there overall psychiatric and medical wellbeing, and total abstinence from substance use was recommended. # Prescriptions provided or sent directly to preferred pharmacy at discharge. Patient agreeable to plan. Given the opportunity to ask questions. Appears to feel comfortable with discharge. Discussed with family, no concerns. All weapons removed from home prior to dc  # In the event of worsening symptoms, the patient is instructed to call the crisis hotline (988), 911 and or go to the nearest ED for appropriate evaluation and treatment of symptoms. To follow-up with primary care provider for other medical issues, concerns and or health care needs # Patient was discharged home to her mother's home with a plan to follow up as noted above.    SignedPaulene Floor, PA-C 06/20/2023, 9:58 AM

## 2023-06-20 NOTE — [facility]HH Suicide Risk Assessment (Signed)
 Suicide Risk Assessment  Discharge Assessment    [facility] Discharge Suicide Risk Assessment   Principal Problem: Bipolar I disorder, single manic episode, severe, with psychosis (HCC) Discharge Diagnoses: Principal Problem:   Bipolar I disorder, single manic episode, severe, with psychosis (HCC) Active Problems:   EtOH dependence (HCC)   Suicide ideation   Substance induced mood disorder (HCC)   Total Time spent with patient: 30 minutes  Musculoskeletal: Strength & Muscle Tone: within normal limits Gait & Station:  limping today, recent right hip surgery  Patient leans: Right  Psychiatric Specialty Exam  Presentation  General Appearance:  Appropriate for Environment  Eye Contact: Good  Speech: Normal Rate  Speech Volume: Normal  Handedness: Right   Mood and Affect  Mood: Euthymic  Duration of Depression Symptoms: Less than two weeks  Affect: Appropriate   Thought Process  Thought Processes: Coherent  Descriptions of Associations:Intact  Orientation:Full (Time, Place and Person)  Thought Content:Logical  History of Schizophrenia/Schizoaffective disorder:No  Duration of Psychotic Symptoms:Greater than six months  Hallucinations:Hallucinations: None  Ideas of Reference:None  Suicidal Thoughts:Suicidal Thoughts: No  Homicidal Thoughts:Homicidal Thoughts: No   Sensorium  Memory: Immediate Good; Recent Good; Remote Good  Judgment: Fair  Insight: Fair   Art therapist  Concentration: Good  Attention Span: Good  Recall: Good  Fund of Knowledge: Fair  Language: Good   Psychomotor Activity  Psychomotor Activity: Psychomotor Activity: Normal   Assets  Assets: Communication Skills; Desire for Improvement; Financial Resources/Insurance; Housing; Leisure Time; Transportation; Social Support   Sleep  Sleep: Sleep: Good   Physical Exam: Physical Exam Vitals and nursing note reviewed.  HENT:     Head:  Normocephalic and atraumatic.     Nose: Nose normal.     Mouth/Throat:     Mouth: Mucous membranes are moist.  Eyes:     Extraocular Movements: Extraocular movements intact.  Pulmonary:     Effort: Pulmonary effort is normal.  Skin:    General: Skin is warm and dry.  Neurological:     General: No focal deficit present.     Mental Status: He is alert and oriented to person, place, and time. Mental status is at baseline.  Psychiatric:        Attention and Perception: Attention and perception normal.        Mood and Affect: Mood and affect normal.        Speech: Speech normal.        Behavior: Behavior normal. Behavior is cooperative.        Thought Content: Thought content normal.        Cognition and Memory: Cognition and memory normal.     Comments: Judgement fair     Review of Systems  Psychiatric/Behavioral:  Positive for substance abuse.   All other systems reviewed and are negative.  Blood pressure 127/73, pulse 94, temperature 98.1 F (36.7 C), resp. rate 16, height 5\' 8"  (1.727 m), weight 103.4 kg, SpO2 98%. Body mass index is 34.67 kg/m.  Mental Status Per Nursing Assessment::   On Admission:  NA  Demographic Factors:  Male, Divorced or widowed, and Caucasian  Loss Factors: Decline in physical health  Historical Factors: Impulsivity  Risk Reduction Factors:   Sense of responsibility to family, Employed, Living with another person, especially a relative, and Positive social support  Continued Clinical Symptoms:  Alcohol/Substance Abuse/Dependencies Previous Psychiatric Diagnoses and Treatments  Cognitive Features That Contribute To Risk:  None    Suicide Risk:  Minimal: No identifiable  suicidal ideation.  Patients presenting with no risk factors but with morbid ruminations; may be classified as minimal risk based on the severity of the depressive symptoms   Follow-up Information     [facility]. Go to.   Specialty: Behavioral Health Why: In  person assessment is 06/25/23 at 10 AM. Medication management appointment will be same day.  Please call a day prior to your appointment to confirm. Contact information: [address] [place] [address] [phone]                 Plan Of Care/Follow-up recommendations:  # It is recommended to the patient to continue psychiatric medications as prescribed, after discharge from the hospital.   # It is recommended to the patient to follow up with your outpatient psychiatric provider and PCP. # It was discussed with the patient, the impact of alcohol, drugs, tobacco have been there overall psychiatric and medical wellbeing, and total abstinence from substance use was recommended. # Prescriptions provided or sent directly to preferred pharmacy at discharge. Patient agreeable to plan. Given the opportunity to ask questions. Appears to feel comfortable with discharge. Discussed discharge with family, all concerns addressed. All weapons removed from home prior to dc # In the event of worsening symptoms, the patient is instructed to call the crisis hotline (988), 911 and or go to the nearest ED for appropriate evaluation and treatment of symptoms. To follow-up with primary care provider for other medical issues, concerns and or health care needs # Patient was discharged home to his mother's home with a plan to follow up as noted above     [employer], PA-C 06/20/2023, 9:56 AM

## 2023-06-20 NOTE — Progress Notes (Signed)
  [facility] Adult Case Management Discharge Plan :  Will you be returning to the same living situation after discharge:  Yes,  pt reports that he is returning home.  At discharge, do you have transportation home?: Yes,  pt reports that family will provide transportation home. Do you have the ability to pay for your medications: Yes,  [facility]  Release of information consent forms completed and in the chart;  Patient's signature needed at discharge.  Patient to Follow up at:  Follow-up Information     [facility]. Go to.   Specialty: Behavioral Health Why: In person assessment is 06/25/23 at 10 AM. Medication management appointment will be same day. Contact information: [address] [place] [address] [phone]                 Next level of care provider has access to [facility] Link:no  Safety Planning and Suicide Prevention discussed: Yes,  SPE completed with patient and his mother.      Has patient been referred to the Quitline?: Patient refused referral for treatment  Patient has been referred for addiction treatment: Yes, the patient will follow up with an outpatient provider for substance use disorder. Therapist: appointment made  Harden Mo, LCSW 06/20/2023, 9:57 AM

## 2023-06-20 NOTE — Progress Notes (Signed)
   06/19/23 2000  Psych Admission Type (Psych Patients Only)  Admission Status Involuntary  Psychosocial Assessment  Patient Complaints None  Eye Contact Fair  Facial Expression Anxious  Affect Flat  Speech Logical/coherent  Interaction Assertive  Motor Activity Slow  Appearance/Hygiene Unremarkable  Behavior Characteristics Cooperative  Mood Pleasant  Aggressive Behavior  Effect No apparent injury  Thought Process  Coherency WDL  Content WDL  Delusions None reported or observed  Perception WDL  Hallucination None reported or observed  Judgment Limited  Confusion None  Danger to Self  Current suicidal ideation? Denies  Agreement Not to Harm Self Yes  Danger to Others  Danger to Others None reported or observed

## 2023-06-20 NOTE — Group Note (Signed)
 Date:  06/20/2023 Time:  10:23 AM  Group Topic/Focus:  Goals Group:   The focus of this group is to help patients establish daily goals to achieve during treatment and discuss how the patient can incorporate goal setting into their daily lives to aide in recovery.    Participation Level:  Active  Participation Quality:  Appropriate  Affect:  Appropriate  Cognitive:  Appropriate  Insight: Appropriate  Engagement in Group:  Engaged  Modes of Intervention:  Discussion, Education, and Support  Additional Comments:    Jenan Ellegood A Eleora Sutherland 06/20/2023, 10:23 AM

## 2023-06-20 NOTE — Plan of Care (Signed)
   Problem: Education: Goal: Knowledge of [facility] Education information/materials will improve Outcome: Progressing Goal: Emotional status will improve Outcome: Progressing Goal: Mental status will improve Outcome: Progressing

## 2023-12-02 ENCOUNTER — Emergency Department
Admission: EM | Admit: 2023-12-02 | Discharge: 2023-12-02 | Disposition: A | Discharge status: Home / Self Care | Attending: Emergency Medicine | Admitting: Emergency Medicine

## 2023-12-02 ENCOUNTER — Other Ambulatory Visit: Payer: Self-pay

## 2023-12-02 ENCOUNTER — Emergency Department

## 2023-12-02 DIAGNOSIS — M25511 Pain in right shoulder: Secondary | ICD-10-CM | POA: Diagnosis not present

## 2023-12-02 DIAGNOSIS — W1839XA Other fall on same level, initial encounter: Secondary | ICD-10-CM | POA: Insufficient documentation

## 2023-12-02 DIAGNOSIS — W19XXXA Unspecified fall, initial encounter: Secondary | ICD-10-CM

## 2023-12-02 DIAGNOSIS — Y9301 Activity, walking, marching and hiking: Secondary | ICD-10-CM | POA: Insufficient documentation

## 2023-12-02 DIAGNOSIS — Y92009 Unspecified place in unspecified non-institutional (private) residence as the place of occurrence of the external cause: Secondary | ICD-10-CM | POA: Insufficient documentation

## 2023-12-02 DIAGNOSIS — Z96641 Presence of right artificial hip joint: Secondary | ICD-10-CM | POA: Insufficient documentation

## 2023-12-02 DIAGNOSIS — M25551 Pain in right hip: Secondary | ICD-10-CM | POA: Diagnosis present

## 2023-12-02 MED ORDER — OXYCODONE-ACETAMINOPHEN 5-325 MG PO TABS
1.0000 | ORAL_TABLET | Freq: Once | ORAL | Status: AC
  Administered 2023-12-02 (×2): 1 via ORAL
  Filled 2023-12-02: qty 1

## 2023-12-02 MED ORDER — CYCLOBENZAPRINE HCL 10 MG PO TABS
5.0000 mg | ORAL_TABLET | Freq: Once | ORAL | Status: AC
  Administered 2023-12-02 (×2): 5 mg via ORAL
  Filled 2023-12-02: qty 1

## 2023-12-02 MED ORDER — CYCLOBENZAPRINE HCL 5 MG PO TABS: 5.0000 mg | ORAL_TABLET | Freq: Three times a day (TID) | ORAL | 0 refills | Status: DC | PRN

## 2023-12-02 NOTE — ED Provider Notes (Signed)
 [facility] Emergency Department Provider Note     Event Date/Time   First MD Initiated Contact with Patient 12/02/23 0930     (approximate)   History   Fall   HPI  Vernon Allen is a 43 y.o. male presents to the ED for evaluation following a fall at home.  Patient reports he was walking down his doorstep when he tripped over an air compressor sustaining pain to his right shoulder and right hip.  Surgical history of right total hip arthroplasty patient denies head injury or LOC.  Denies numbness or tingling.    Physical Exam   Triage Vital Signs: ED Triage Vitals  Encounter Vitals Group     BP 12/02/23 0908 132/89     Girls Systolic BP Percentile --      Girls Diastolic BP Percentile --      Boys Systolic BP Percentile --      Boys Diastolic BP Percentile --      Pulse Rate 12/02/23 0908 96     Resp 12/02/23 0908 18     Temp 12/02/23 0908 98.2 F (36.8 C)     Temp src --      SpO2 12/02/23 0908 99 %     Weight 12/02/23 0906 230 lb (104.3 kg)     Height 12/02/23 0906 5' 9 (1.753 m)     Head Circumference --      Peak Flow --      Pain Score 12/02/23 0906 8     Pain Loc --      Pain Education --      Exclude from Growth Chart --     Most recent vital signs: Vitals:   12/02/23 0908  BP: 132/89  Pulse: 96  Resp: 18  Temp: 98.2 F (36.8 C)  SpO2: 99%    General Awake, no distress.  HEENT NCAT.  CV:  Good peripheral perfusion.  RESP:  Normal effort.  ABD:  No distention.  Other:  Right shoulder reveals no visible deformity.  Tenderness over right pectoralis muscle with range of motion.  F ROM with abduction and adduction without difficulty.  Limited range of motion with extension and flexion secondary to pain.  Right hip reveals no visible deformity or ecchymosis.  Hip flexion limited secondary to pain.  Neurovascular status intact all throughout.   ED Results / Procedures / Treatments   Labs (all labs ordered are listed,  but only abnormal results are displayed) Labs Reviewed - No data to display  RADIOLOGY  I personally viewed and evaluated these images as part of my medical decision making, as well as reviewing the written report by the radiologist.  ED Provider Interpretation: No acute abnormality to right shoulder or right hip.  Appears stable hardware in right hip will confirm with final radiology read.  DG Shoulder Right Result Date: 12/02/2023 CLINICAL DATA:  Right shoulder pain after tripping and falling. EXAM: RIGHT SHOULDER - 2+ VIEW COMPARISON:  Radiographs 09/26/2011. FINDINGS: The mineralization and alignment are normal. There is no evidence of acute fracture or dislocation. The joint spaces are preserved. No acute soft tissue abnormalities are identified. Ribbon like density projecting over the right axillary region, presumably external to the patient. Correlate clinically. IMPRESSION: No evidence of acute fracture or dislocation. Ribbon like density projecting over the right axillary region, presumably external to the patient. Electronically Signed   By: Elsie Perone M.D.   On: 12/02/2023 10:26   DG Hip Unilat  W or Wo Pelvis 2-3 Views Right Result Date: 12/02/2023 CLINICAL DATA:  Right hip pain after tripping and falling. EXAM: DG HIP (WITH OR WITHOUT PELVIS) 2-3V RIGHT COMPARISON:  Radiographs 04/11/2023. FINDINGS: Status post right total hip arthroplasty. The hardware is intact without loosening. No evidence of acute fracture or dislocation. No evidence of left femoral head osteonecrosis or significant left hip arthropathy. The sacroiliac joints and symphysis pubis are intact. IMPRESSION: No evidence of acute fracture or dislocation. Right total hip arthroplasty without demonstrated complication. Electronically Signed   By: Elsie Perone M.D.   On: 12/02/2023 10:25    PROCEDURES:  Critical Care performed: No  Procedures   MEDICATIONS ORDERED IN ED: Medications  oxyCODONE -acetaminophen   (PERCOCET/ROXICET) 5-325 MG per tablet 1 tablet (1 tablet Oral Given 12/02/23 1021)  cyclobenzaprine  (FLEXERIL ) tablet 5 mg (5 mg Oral Given 12/02/23 1021)   IMPRESSION / MDM / ASSESSMENT AND PLAN / ED COURSE  I reviewed the triage vital signs and the nursing notes.                               43 y.o. male presents to the emergency department for evaluation and treatment of acute right shoulder and right hip pain following fall. See HPI for further details.   Differential diagnosis includes, but is not limited to fracture, dislocation, strain, contusion   Patient's presentation is most consistent with acute complicated illness / injury requiring diagnostic workup.  Patient is alert and oriented.  He is hemodynamically stable.  Physical exam findings are stated above.  Reassuring x-rays of right hip and right shoulder.  Improvement with pain medication and muscle relaxer administered in the ED.  work note requested.   The patient is in stable and satisfactory condition for discharge home. Encouraged to follow up with primary care provider for further management. ED precautions discussed. All questions and concerns were addressed during this ED visit.     FINAL CLINICAL IMPRESSION(S) / ED DIAGNOSES   Final diagnoses:  Fall, initial encounter  Right hip pain  Acute pain of right shoulder   Rx / DC Orders   ED Discharge Orders          Ordered    cyclobenzaprine  (FLEXERIL ) 5 MG tablet  3 times daily PRN        12/02/23 1133             Note:  This document was prepared using Dragon voice recognition software and may include unintentional dictation errors.    Margrette, Adama Ivins A, PA-C 12/02/23 1505    Suzanne Kirsch, MD 12/02/23 1614

## 2023-12-02 NOTE — ED Triage Notes (Addendum)
 Pt comes with fall and hit his right shoulder and hip. Pt states he tripped over a air compressor. Pt denies any loc or hitting head. Pt states surgery back in DEc 2024 on the same hip.

## 2023-12-02 NOTE — Discharge Instructions (Addendum)
 You were evaluated in the ED for a fall.  Your x-ray of the right shoulder and right hip are normal.  Please get plenty of rest and limit your physical activity until symptoms improve.  Consider alternating heat and ice over the affected area.  Take Tylenol  as needed for pain.

## 2024-01-08 ENCOUNTER — Other Ambulatory Visit: Payer: Self-pay

## 2024-01-08 ENCOUNTER — Emergency Department

## 2024-01-08 ENCOUNTER — Emergency Department
Admission: EM | Admit: 2024-01-08 | Discharge: 2024-01-08 | Disposition: A | Discharge status: Home / Self Care | Attending: Emergency Medicine | Admitting: Emergency Medicine

## 2024-01-08 DIAGNOSIS — W19XXXA Unspecified fall, initial encounter: Secondary | ICD-10-CM

## 2024-01-08 DIAGNOSIS — M79651 Pain in right thigh: Secondary | ICD-10-CM | POA: Diagnosis present

## 2024-01-08 DIAGNOSIS — W010XXA Fall on same level from slipping, tripping and stumbling without subsequent striking against object, initial encounter: Secondary | ICD-10-CM | POA: Diagnosis not present

## 2024-01-08 DIAGNOSIS — M25561 Pain in right knee: Secondary | ICD-10-CM | POA: Insufficient documentation

## 2024-01-08 DIAGNOSIS — Z96641 Presence of right artificial hip joint: Secondary | ICD-10-CM | POA: Insufficient documentation

## 2024-01-08 MED ORDER — OXYCODONE-ACETAMINOPHEN 5-325 MG PO TABS
1.0000 | ORAL_TABLET | Freq: Once | ORAL | Status: AC
Start: 2024-01-08 — End: 2024-01-08
  Administered 2024-01-08: 1 via ORAL
  Filled 2024-01-08: qty 1

## 2024-01-08 NOTE — ED Triage Notes (Signed)
 Pt to ED via POV from home. Pt reports slipped on his pants leg getting out of bed this am. Pt reports pain to lower back, right upper thigh pain and right knee pain. Pt reports right hip replacement in December.

## 2024-01-08 NOTE — ED Provider Notes (Signed)
 [facility] Provider Note    Event Date/Time   First MD Initiated Contact with Patient 01/08/24 1015     (approximate)   History   Fall   HPI  Vernon Allen is a 43 y.o. male  with a past medical history of bipolar 1 disorder, MDD, right THA, EtOH dependence presents to the emergency department after a fall that occurred on 830.  Patient states he was getting up late for work when he went to stand up and slid on some of his close and fell onto his right buttocks.  Patient reports pain along his lateral right thigh and behind his right knee, described as dull.  Patient reported back pain in triage, but when asked about his back he is not having any pain at this time in that area.  Patient denies hitting his head, loss of consciousness, shortness of breath, abdominal pain, chest pain, any other injuries. Denies groin pain, fever, chills, saddle anesthesia, dysuria. Patient has not taken anything for pain at home. Right THA was in December 2024 and has not given him any problems since.  Patient has not used any alcohol today.   Physical Exam   Triage Vital Signs: ED Triage Vitals  Encounter Vitals Group     BP 01/08/24 0939 125/88     Girls Systolic BP Percentile --      Girls Diastolic BP Percentile --      Boys Systolic BP Percentile --      Boys Diastolic BP Percentile --      Pulse Rate 01/08/24 0939 86     Resp 01/08/24 0939 17     Temp 01/08/24 0939 97.8 F (36.6 C)     Temp Source 01/08/24 0939 Oral     SpO2 01/08/24 0939 100 %     Weight 01/08/24 0940 225 lb (102.1 kg)     Height 01/08/24 0940 5' 9 (1.753 m)     Head Circumference --      Peak Flow --      Pain Score 01/08/24 0951 7     Pain Loc --      Pain Education --      Exclude from Growth Chart --     Most recent vital signs: Vitals:   01/08/24 0939  BP: 125/88  Pulse: 86  Resp: 17  Temp: 97.8 F (36.6 C)  SpO2: 100%    General: Awake, in no acute distress. Appears  stated age. Head: Normocephalic, atraumatic. CV: Good peripheral perfusion. DP pulses 2+ b/l. Respiratory:Normal respiratory effort.  No respiratory distress.  GI: Soft, non-distended, non-tender.  MSK: Normal ROM and  5/5 strength in b/l hips, knees, and ankles. No midline cervical, thoracic, or lumbar spinal or paraspinal tenderness. Negative straight leg raise b/l. TTP along right posterior knee and right lateral thigh in proximal IT band area. No tenderness of right SI joint palpation. No ACL, MCL, LCL laxity of right knee. Negative McMurray's of right knee.   Skin:Warm, dry, intact. No rashes, obvious bony deformities or swelling. Neurological: A&Ox4 to person, place, time, and situation.  Sensation intact and equal to b/l lower extremities. Strength symmetric. No focal deficits.  No CVA tenderness b/l.   ED Results / Procedures / Treatments   Labs (all labs ordered are listed, but only abnormal results are displayed) Labs Reviewed - No data to display   EKG     RADIOLOGY X rays right knee, hip and lumbar spine ordered.  PROCEDURES:  Critical Care performed: No   Procedures   MEDICATIONS ORDERED IN ED: Medications  oxyCODONE -acetaminophen  (PERCOCET/ROXICET) 5-325 MG per tablet 1 tablet (1 tablet Oral Given 01/08/24 1047)     IMPRESSION / MDM / ASSESSMENT AND PLAN / ED COURSE  I reviewed the triage vital signs and the nursing notes.                              Differential diagnosis includes, but is not limited to, right knee sprain, ligamentous injury, IT band sprain, right THA displacement or surrounding fracture  Patient's presentation is most consistent with acute complicated illness / injury requiring diagnostic workup.  Patient is a 43 year old male presenting after a fall that occurred around 830 this morning.  Patient denied hitting his head or loss consciousness.  Patient reported right knee and hip pain.  He does not have any obvious bony deformity or  laxity of his knee, adequate range of motion of the hip and knee.  Patient denies any back pain and no tenderness on my exam.  No red flag symptoms of back pain.  X-rays of the lumbar spine, right hip and right knee without any acute findings. I independently viewed the x-rays and radiologist's report.  I agree with the radiologist's reports that right THA is in place with good alignment, no lucency; and right knee with no fracture, dislocation or joint effusion.  Will place patient in a right knee brace with crutches.  Discussed Tylenol  and use at home given his allergy to Mobic.  Given follow-up information with orthopedics if he is not feeling better in the next few days. Otherwise, he can follow up with his PCP.  The patient may return to the emergency department for any new, worsening, or concerning symptoms. Patient was given the opportunity to ask questions; all questions were answered. Emergency department return precautions were discussed with the patient.  Patient is in agreement to the treatment plan.  Patient is stable for discharge.    FINAL CLINICAL IMPRESSION(S) / ED DIAGNOSES   Final diagnoses:  Fall, initial encounter  Posterior right knee pain  Pain of right lateral upper thigh     Rx / DC Orders   ED Discharge Orders     None        Note:  This document was prepared using Dragon voice recognition software and may include unintentional dictation errors.     Sheron Salm, PA-C 01/08/24 1127    Ernest Ronal BRAVO, MD 01/09/24 [phone]

## 2024-01-08 NOTE — Discharge Instructions (Addendum)
 You were seen in the emergency department for a suspected sprain of your knee versus a ligament injury. This injury is typically caused after an injury to the knee and can range from mild to severe. If you had x-rays, they did not show a fracture. Recovery can takes days to weeks depending on the severity of the injury. Please read through the packet of information regarding treatment for the first 48-72 hours that is labeled RICE (Rest, Ice, Compression, Elevation). You may use over-the-counter medications such as Tylenol  (unless a doctor has told you not to take them) with dosing based on the instructions on the bottle. You may gradually resume activity as pain and swelling improve. Begin gentle range-of-motion exercises after a few days as tolerable.  Follow-up with the orthopedist listed in this paperwork if you are not feeling better within the next few days.  Call your doctor or go to the ER or urgent care if you notice numbness or tingling, develop a fever, redness, warmth, or pus-like drainage or are unable to bear weight on your hip or knee or for any other new, worsening, or concerning symptoms.

## 2024-01-30 ENCOUNTER — Encounter (HOSPITAL_COMMUNITY): Payer: Self-pay

## 2024-01-30 ENCOUNTER — Other Ambulatory Visit: Payer: Self-pay

## 2024-01-30 ENCOUNTER — Inpatient Hospital Stay (HOSPITAL_COMMUNITY)
Admission: AD | Admit: 2024-01-30 | Discharge: 2024-02-11 | DRG: 885 | Disposition: A | Discharge status: Rehabilitation Facility | Source: Other Acute Inpatient Hospital

## 2024-01-30 ENCOUNTER — Emergency Department
Admission: EM | Admit: 2024-01-30 | Discharge: 2024-01-30 | Disposition: A | Discharge status: Home / Self Care | Attending: Emergency Medicine | Admitting: Emergency Medicine

## 2024-01-30 DIAGNOSIS — Z96641 Presence of right artificial hip joint: Secondary | ICD-10-CM | POA: Diagnosis present

## 2024-01-30 DIAGNOSIS — M549 Dorsalgia, unspecified: Secondary | ICD-10-CM | POA: Diagnosis not present

## 2024-01-30 DIAGNOSIS — Z79899 Other long term (current) drug therapy: Secondary | ICD-10-CM | POA: Diagnosis not present

## 2024-01-30 DIAGNOSIS — F332 Major depressive disorder, recurrent severe without psychotic features: Secondary | ICD-10-CM | POA: Diagnosis present

## 2024-01-30 DIAGNOSIS — F1994 Other psychoactive substance use, unspecified with psychoactive substance-induced mood disorder: Secondary | ICD-10-CM | POA: Diagnosis present

## 2024-01-30 DIAGNOSIS — M1990 Unspecified osteoarthritis, unspecified site: Secondary | ICD-10-CM | POA: Diagnosis present

## 2024-01-30 DIAGNOSIS — Z59868 Other specified financial insecurity: Secondary | ICD-10-CM | POA: Diagnosis not present

## 2024-01-30 DIAGNOSIS — F17200 Nicotine dependence, unspecified, uncomplicated: Secondary | ICD-10-CM | POA: Diagnosis present

## 2024-01-30 DIAGNOSIS — E663 Overweight: Secondary | ICD-10-CM | POA: Diagnosis present

## 2024-01-30 DIAGNOSIS — E7229 Other disorders of urea cycle metabolism: Secondary | ICD-10-CM | POA: Diagnosis not present

## 2024-01-30 DIAGNOSIS — F419 Anxiety disorder, unspecified: Secondary | ICD-10-CM | POA: Diagnosis not present

## 2024-01-30 DIAGNOSIS — I10 Essential (primary) hypertension: Secondary | ICD-10-CM | POA: Diagnosis present

## 2024-01-30 DIAGNOSIS — Z91148 Patient's other noncompliance with medication regimen for other reason: Secondary | ICD-10-CM

## 2024-01-30 DIAGNOSIS — F3130 Bipolar disorder, current episode depressed, mild or moderate severity, unspecified: Secondary | ICD-10-CM | POA: Diagnosis present

## 2024-01-30 DIAGNOSIS — E559 Vitamin D deficiency, unspecified: Secondary | ICD-10-CM | POA: Diagnosis present

## 2024-01-30 DIAGNOSIS — Y901 Blood alcohol level of 20-39 mg/100 ml: Secondary | ICD-10-CM | POA: Diagnosis not present

## 2024-01-30 DIAGNOSIS — F1020 Alcohol dependence, uncomplicated: Secondary | ICD-10-CM | POA: Insufficient documentation

## 2024-01-30 DIAGNOSIS — Z881 Allergy status to other antibiotic agents status: Secondary | ICD-10-CM | POA: Diagnosis not present

## 2024-01-30 DIAGNOSIS — Z87892 Personal history of anaphylaxis: Secondary | ICD-10-CM

## 2024-01-30 DIAGNOSIS — F302 Manic episode, severe with psychotic symptoms: Secondary | ICD-10-CM | POA: Diagnosis present

## 2024-01-30 DIAGNOSIS — G8929 Other chronic pain: Secondary | ICD-10-CM | POA: Diagnosis present

## 2024-01-30 DIAGNOSIS — Z720 Tobacco use: Secondary | ICD-10-CM | POA: Diagnosis not present

## 2024-01-30 DIAGNOSIS — Z888 Allergy status to other drugs, medicaments and biological substances status: Secondary | ICD-10-CM | POA: Diagnosis not present

## 2024-01-30 DIAGNOSIS — F10239 Alcohol dependence with withdrawal, unspecified: Secondary | ICD-10-CM | POA: Diagnosis present

## 2024-01-30 DIAGNOSIS — F1090 Alcohol use, unspecified, uncomplicated: Principal | ICD-10-CM | POA: Diagnosis present

## 2024-01-30 DIAGNOSIS — G4700 Insomnia, unspecified: Secondary | ICD-10-CM | POA: Diagnosis present

## 2024-01-30 DIAGNOSIS — Z91014 Allergy to mammalian meats: Secondary | ICD-10-CM | POA: Diagnosis not present

## 2024-01-30 DIAGNOSIS — Z6833 Body mass index (BMI) 33.0-33.9, adult: Secondary | ICD-10-CM | POA: Diagnosis not present

## 2024-01-30 DIAGNOSIS — Z886 Allergy status to analgesic agent status: Secondary | ICD-10-CM | POA: Diagnosis not present

## 2024-01-30 DIAGNOSIS — F17210 Nicotine dependence, cigarettes, uncomplicated: Secondary | ICD-10-CM | POA: Diagnosis present

## 2024-01-30 DIAGNOSIS — F3132 Bipolar disorder, current episode depressed, moderate: Secondary | ICD-10-CM | POA: Insufficient documentation

## 2024-01-30 DIAGNOSIS — T50906A Underdosing of unspecified drugs, medicaments and biological substances, initial encounter: Secondary | ICD-10-CM | POA: Diagnosis present

## 2024-01-30 DIAGNOSIS — R7989 Other specified abnormal findings of blood chemistry: Secondary | ICD-10-CM | POA: Diagnosis present

## 2024-01-30 DIAGNOSIS — Z566 Other physical and mental strain related to work: Secondary | ICD-10-CM

## 2024-01-30 DIAGNOSIS — E7220 Disorder of urea cycle metabolism, unspecified: Secondary | ICD-10-CM | POA: Diagnosis not present

## 2024-01-30 DIAGNOSIS — F312 Bipolar disorder, current episode manic severe with psychotic features: Secondary | ICD-10-CM | POA: Diagnosis not present

## 2024-01-30 DIAGNOSIS — F319 Bipolar disorder, unspecified: Secondary | ICD-10-CM | POA: Diagnosis not present

## 2024-01-30 LAB — CBC
HCT: 40.9 % (ref 39.0–52.0)
Hemoglobin: 14.9 g/dL (ref 13.0–17.0)
MCH: 34.4 pg — ABNORMAL HIGH (ref 26.0–34.0)
MCHC: 36.4 g/dL — ABNORMAL HIGH (ref 30.0–36.0)
MCV: 94.5 fL (ref 80.0–100.0)
Platelets: 194 K/uL (ref 150–400)
RBC: 4.33 MIL/uL (ref 4.22–5.81)
RDW: 14.4 % (ref 11.5–15.5)
WBC: 6.8 K/uL (ref 4.0–10.5)
nRBC: 0 % (ref 0.0–0.2)

## 2024-01-30 LAB — COMPREHENSIVE METABOLIC PANEL WITH GFR
ALT: 29 U/L (ref 0–44)
AST: 132 U/L — ABNORMAL HIGH (ref 15–41)
Albumin: 3.2 g/dL — ABNORMAL LOW (ref 3.5–5.0)
Alkaline Phosphatase: 122 U/L (ref 38–126)
Anion gap: 11 (ref 5–15)
BUN: 13 mg/dL (ref 6–20)
CO2: 24 mmol/L (ref 22–32)
Calcium: 8.6 mg/dL — ABNORMAL LOW (ref 8.9–10.3)
Chloride: 101 mmol/L (ref 98–111)
Creatinine, Ser: 1.11 mg/dL (ref 0.61–1.24)
GFR, Estimated: >60 mL/min (ref >60)
Glucose, Bld: 110 mg/dL — ABNORMAL HIGH (ref 70–99)
Potassium: 5.8 mmol/L — ABNORMAL HIGH (ref 3.5–5.1)
Sodium: 136 mmol/L (ref 135–145)
Total Bilirubin: 2.8 mg/dL — ABNORMAL HIGH (ref 0.0–1.2)
Total Protein: 6.1 g/dL — ABNORMAL LOW (ref 6.5–8.1)

## 2024-01-30 LAB — URINE DRUG SCREEN, QUALITATIVE (ARMC ONLY)
Amphetamines, Ur Screen: NOT DETECTED
Barbiturates, Ur Screen: NOT DETECTED
Benzodiazepine, Ur Scrn: NOT DETECTED
Cannabinoid 50 Ng, Ur ~~LOC~~: NOT DETECTED
Cocaine Metabolite,Ur ~~LOC~~: NOT DETECTED
MDMA (Ecstasy)Ur Screen: NOT DETECTED
Methadone Scn, Ur: NOT DETECTED
Opiate, Ur Screen: NOT DETECTED
Phencyclidine (PCP) Ur S: NOT DETECTED
Tricyclic, Ur Screen: NOT DETECTED

## 2024-01-30 LAB — ETHANOL: Alcohol, Ethyl (B): 35 mg/dL — ABNORMAL HIGH (ref <15)

## 2024-01-30 MED ORDER — LORAZEPAM 2 MG PO TABS
2.0000 mg | ORAL_TABLET | Freq: Once | ORAL | Status: AC
  Administered 2024-01-30: 2 mg via ORAL
  Filled 2024-01-30: qty 1

## 2024-01-30 MED ORDER — TRAZODONE HCL 50 MG PO TABS
50.0000 mg | ORAL_TABLET | Freq: Every day | ORAL | Status: DC
  Administered 2024-01-30 – 2024-02-10 (×12): 50 mg via ORAL
  Filled 2024-01-30 (×13): qty 1

## 2024-01-30 MED ORDER — ONDANSETRON HCL 4 MG PO TABS
4.0000 mg | ORAL_TABLET | Freq: Three times a day (TID) | ORAL | Status: DC | PRN
  Administered 2024-01-30: 4 mg via ORAL
  Filled 2024-01-30: qty 1

## 2024-01-30 MED ORDER — LORAZEPAM 2 MG PO TABS
0.0000 mg | ORAL_TABLET | Freq: Four times a day (QID) | ORAL | Status: DC
  Administered 2024-01-30: 1 mg via ORAL
  Filled 2024-01-30 (×2): qty 1

## 2024-01-30 MED ORDER — LORAZEPAM 2 MG/ML IJ SOLN: 2.0000 mg | Freq: Three times a day (TID) | INTRAMUSCULAR | Status: DC | PRN

## 2024-01-30 MED ORDER — ALUM & MAG HYDROXIDE-SIMETH 200-200-20 MG/5ML PO SUSP: 30.0000 mL | Freq: Four times a day (QID) | ORAL | Status: DC | PRN

## 2024-01-30 MED ORDER — LORAZEPAM 1 MG PO TABS
0.0000 mg | ORAL_TABLET | Freq: Four times a day (QID) | ORAL | Status: AC
  Administered 2024-01-30: 2 mg via ORAL
  Administered 2024-01-31 (×2): 1 mg via ORAL
  Administered 2024-01-31: 2 mg via ORAL
  Administered 2024-02-01 (×2): 1 mg via ORAL
  Filled 2024-01-30 (×2): qty 1
  Filled 2024-01-30: qty 2
  Filled 2024-01-30 (×2): qty 1
  Filled 2024-01-30: qty 2

## 2024-01-30 MED ORDER — HALOPERIDOL LACTATE 5 MG/ML IJ SOLN: 10.0000 mg | Freq: Three times a day (TID) | INTRAMUSCULAR | Status: DC | PRN

## 2024-01-30 MED ORDER — ADULT MULTIVITAMIN W/MINERALS CH
1.0000 | ORAL_TABLET | Freq: Every day | ORAL | Status: DC
  Administered 2024-01-31 – 2024-02-11 (×12): 1 via ORAL
  Filled 2024-01-30 (×3): qty 1
  Filled 2024-01-30: qty 14
  Filled 2024-01-30 (×9): qty 1

## 2024-01-30 MED ORDER — FAMOTIDINE 20 MG PO TABS
40.0000 mg | ORAL_TABLET | Freq: Once | ORAL | Status: AC
  Administered 2024-01-30: 40 mg via ORAL
  Filled 2024-01-30: qty 2

## 2024-01-30 MED ORDER — LORAZEPAM 2 MG/ML IJ SOLN: 0.0000 mg | Freq: Four times a day (QID) | INTRAMUSCULAR | Status: DC

## 2024-01-30 MED ORDER — LORAZEPAM 1 MG PO TABS
0.0000 mg | ORAL_TABLET | Freq: Two times a day (BID) | ORAL | Status: AC
  Administered 2024-02-01: 2 mg via ORAL
  Administered 2024-02-02 – 2024-02-03 (×3): 1 mg via ORAL
  Filled 2024-01-30: qty 1
  Filled 2024-01-30: qty 2
  Filled 2024-01-30 (×2): qty 1

## 2024-01-30 MED ORDER — HALOPERIDOL 5 MG PO TABS: 5.0000 mg | ORAL_TABLET | Freq: Three times a day (TID) | ORAL | Status: DC | PRN

## 2024-01-30 MED ORDER — THIAMINE MONONITRATE 100 MG PO TABS
100.0000 mg | ORAL_TABLET | Freq: Every day | ORAL | Status: DC
  Administered 2024-01-30: 100 mg via ORAL
  Filled 2024-01-30: qty 1

## 2024-01-30 MED ORDER — DIPHENHYDRAMINE HCL 50 MG/ML IJ SOLN: 50.0000 mg | Freq: Three times a day (TID) | INTRAMUSCULAR | Status: DC | PRN

## 2024-01-30 MED ORDER — LORAZEPAM 2 MG/ML IJ SOLN: 0.0000 mg | Freq: Two times a day (BID) | INTRAMUSCULAR | Status: DC

## 2024-01-30 MED ORDER — THIAMINE HCL 100 MG/ML IJ SOLN: 100.0000 mg | Freq: Every day | INTRAMUSCULAR | Status: DC

## 2024-01-30 MED ORDER — EPINEPHRINE 0.15 MG/0.3ML IJ SOAJ: 0.1500 mg | Freq: Once | INTRAMUSCULAR | Status: DC

## 2024-01-30 MED ORDER — MAGNESIUM HYDROXIDE 400 MG/5ML PO SUSP: 30.0000 mL | Freq: Every day | ORAL | Status: DC | PRN

## 2024-01-30 MED ORDER — LORAZEPAM 2 MG PO TABS: 0.0000 mg | ORAL_TABLET | Freq: Two times a day (BID) | ORAL | Status: DC

## 2024-01-30 MED ORDER — ACETAMINOPHEN 325 MG PO TABS
650.0000 mg | ORAL_TABLET | Freq: Four times a day (QID) | ORAL | Status: DC | PRN
  Administered 2024-01-31 – 2024-02-08 (×7): 650 mg via ORAL
  Filled 2024-01-30 (×7): qty 2

## 2024-01-30 MED ORDER — ENSURE PLUS HIGH PROTEIN PO LIQD
237.0000 mL | Freq: Two times a day (BID) | ORAL | Status: DC
Start: 2024-01-31 — End: 2024-02-11
  Administered 2024-01-31 – 2024-02-09 (×14): 237 mL via ORAL
  Filled 2024-01-30 (×25): qty 237

## 2024-01-30 MED ORDER — ALUM & MAG HYDROXIDE-SIMETH 200-200-20 MG/5ML PO SUSP: 30.0000 mL | ORAL | Status: DC | PRN

## 2024-01-30 MED ORDER — EPINEPHRINE 0.3 MG/0.3ML IJ SOAJ
0.3000 mg | INTRAMUSCULAR | Status: DC | PRN
Start: 2024-01-30 — End: 2024-02-11
  Filled 2024-01-30: qty 0.3

## 2024-01-30 MED ORDER — HALOPERIDOL LACTATE 5 MG/ML IJ SOLN: 5.0000 mg | Freq: Three times a day (TID) | INTRAMUSCULAR | Status: DC | PRN

## 2024-01-30 MED ORDER — DIPHENHYDRAMINE HCL 25 MG PO CAPS: 50.0000 mg | ORAL_CAPSULE | Freq: Three times a day (TID) | ORAL | Status: DC | PRN

## 2024-01-30 MED ORDER — VITAMIN B-1 100 MG PO TABS
100.0000 mg | ORAL_TABLET | Freq: Every day | ORAL | Status: DC
  Administered 2024-01-31 – 2024-02-11 (×12): 100 mg via ORAL
  Filled 2024-01-30 (×12): qty 1

## 2024-01-30 MED ORDER — FOLIC ACID 1 MG PO TABS
1.0000 mg | ORAL_TABLET | Freq: Every day | ORAL | Status: DC
  Administered 2024-01-31 – 2024-02-11 (×12): 1 mg via ORAL
  Filled 2024-01-30 (×12): qty 1

## 2024-01-30 MED ORDER — THIAMINE HCL 100 MG/ML IJ SOLN
100.0000 mg | Freq: Every day | INTRAMUSCULAR | Status: DC
  Filled 2024-01-30 (×2): qty 2

## 2024-01-30 NOTE — ED Provider Notes (Signed)
 [facility] Provider Note    Event Date/Time   First MD Initiated Contact with Patient 01/30/24 1110     (approximate)   History   Chief Complaint: Suicidal   HPI  Vernon Allen is a 43 y.o. male with a history of bipolar disorder, alcohol abuse and dependence who comes ED complaining of increased depression symptoms and suicidal ideation.  Last drink was yesterday.  He has been spiraling lately.  Stopped his bipolar meds about a month ago.  Denies history of hallucinosis or seizure with alcohol withdrawal but does get shaky and sweaty.        Past Medical History:  Diagnosis Date   Alcohol abuse    Allergy to alpha-gal    Anxiety    Avascular necrosis of bone of right hip (HCC) 02/2023   Bipolar 1 disorder (HCC)    Elevated blood pressure reading with diagnosis of hypertension    Perforated appendicitis 10/09/2018   Perirectal abscess 05/19/2021    Current Outpatient Rx   Order #: [id number] Class: Normal   Order #: [id number] Class: Normal   Order #: [id number] Class: Normal   Order #: [id number] Class: Normal   Order #: [id number] Class: Print   Order #: [id number] Class: Normal    Past Surgical History:  Procedure Laterality Date   BACK SURGERY  2008   L4-5 ruptured disc   INCISION AND DRAINAGE PERIRECTAL ABSCESS Left 05/19/2021   LAPAROSCOPIC APPENDECTOMY N/A 10/09/2018   Procedure: APPENDECTOMY LAPAROSCOPIC ATTEMPTED;  Surgeon: Jordis Laneta FALCON, MD;  Location: [facility] ORS;  Service: General;  Laterality: N/A;   PARTIAL COLECTOMY Right 10/09/2018   Procedure: PARTIAL COLECTOMY;  Surgeon: Jordis Laneta FALCON, MD;  Location: [facility] OR[facility];  Service: General;  Laterality: Right;   TOTAL HIP ARTHROPLASTY Right 04/11/2023   Procedure: TOTAL HIP ARTHROPLASTY;  Surgeon: Edie Norleen PARAS, MD;  Location: [facility] ORS;  Service: Orthopedics;  Laterality: Right;    Physical Exam   Triage Vital Signs: ED Triage Vitals  Encounter Vitals Group     BP 01/30/24 1027  (!) 146/102     Girls Systolic BP Percentile --      Girls Diastolic BP Percentile --      Boys Systolic BP Percentile --      Boys Diastolic BP Percentile --      Pulse Rate 01/30/24 1027 84     Resp 01/30/24 1027 18     Temp 01/30/24 1027 98.9 F (37.2 C)     Temp Source 01/30/24 1027 Oral     SpO2 01/30/24 1027 98 %     Weight 01/30/24 1027 230 lb (104.3 kg)     Height 01/30/24 1027 5' 9 (1.753 m)     Head Circumference --      Peak Flow --      Pain Score 01/30/24 1030 0     Pain Loc --      Pain Education --      Exclude from Growth Chart --     Most recent vital signs: Vitals:   01/30/24 1027  BP: (!) 146/102  Pulse: 84  Resp: 18  Temp: 98.9 F (37.2 C)  SpO2: 98%    General: Awake, no distress.  CV:  Good peripheral perfusion.  Regular rate rhythm Resp:  Normal effort.  Clear lungs Abd:  No distention.  Soft nontender Other:  No wounds.  No flushing, diaphoresis, fasciculations.   ED Results / Procedures / Treatments   Labs (all labs ordered are listed,  but only abnormal results are displayed) Labs Reviewed  CBC - Abnormal; Notable for the following components:      Result Value   MCH 34.4 (*)    MCHC 36.4 (*)    All other components within normal limits  ETHANOL  URINE DRUG SCREEN, QUALITATIVE ([facility] ONLY)  COMPREHENSIVE METABOLIC PANEL WITH GFR     EKG Interpreted by me Normal sinus rhythm rate of 73.  Normal axis and intervals.  Poor R wave progression.  No acute ischemic changes.   RADIOLOGY    PROCEDURES:  Procedures   MEDICATIONS ORDERED IN ED: Medications  ondansetron  (ZOFRAN ) tablet 4 mg (4 mg Oral Given 01/30/24 1224)  alum & mag hydroxide-simeth (MAALOX/MYLANTA) 200-200-20 MG/5ML suspension 30 mL (has no administration in time range)  LORazepam  (ATIVAN ) injection 0-4 mg ( Intravenous See Alternative 01/30/24 1130)    Or  LORazepam  (ATIVAN ) tablet 0-4 mg ( Oral Not Given 01/30/24 1130)  LORazepam  (ATIVAN ) injection 0-4 mg (has  no administration in time range)    Or  LORazepam  (ATIVAN ) tablet 0-4 mg (has no administration in time range)  thiamine  (VITAMIN B1) tablet 100 mg (100 mg Oral Given 01/30/24 1224)    Or  thiamine  (VITAMIN B1) injection 100 mg ( Intravenous See Alternative 01/30/24 1224)  famotidine  (PEPCID ) tablet 40 mg (40 mg Oral Given 01/30/24 1224)     IMPRESSION / MDM / ASSESSMENT AND PLAN / ED COURSE  I reviewed the triage vital signs and the nursing notes.  DDx: Dehydration, AKI, electrolyte derangement, major depression  Patient's presentation is most consistent with acute presentation with potential threat to life or bodily function.  Patient presents with increased symptoms of depression.  Vital signs and exam are unremarkable, not in significant alcohol withdrawal at this time.  Will check labs, plan to consult psychiatry.  Not requiring IVC right now.       FINAL CLINICAL IMPRESSION(S) / ED DIAGNOSES   Final diagnoses:  Bipolar affective disorder, currently depressed, moderate (HCC)  Uncomplicated alcohol dependence (HCC)     Rx / DC Orders   ED Discharge Orders     None        Note:  This document was prepared using Dragon voice recognition software and may include unintentional dictation errors.   Viviann Pastor, MD 01/30/24 1332

## 2024-01-30 NOTE — ED Notes (Signed)
 EDt unable to get any blood. Pt stuck with no success

## 2024-01-30 NOTE — ED Notes (Signed)
 This EDT at this time, tried to stick this pt. Stick was unsuccessful, lab has been called.

## 2024-01-30 NOTE — ED Notes (Signed)
EMTALA reviewed by this RN at this time.  

## 2024-01-30 NOTE — ED Triage Notes (Signed)
 Pt comes with c/o depression and fatigue. Pt states he has just been sleeping. Pt states no SI but he just needs help. Pt was drinking real heavy and throwing up. Pt states he stopped his bipolar meds month ago bc he was feeling better.   Pt denies any drug use.   Pt states last drink was last night. Pt states he drank 3 wines and 6/7 beers.

## 2024-01-30 NOTE — ED Notes (Signed)
 Pt given dinner tray and beverage

## 2024-01-30 NOTE — BH Assessment (Signed)
 Comprehensive Clinical Assessment (CCA) Note  01/30/2024 Vernon Allen [id number]  Chief Complaint:  Chief Complaint  Patient presents with   Suicidal   Visit Diagnosis: Bipolar  Vernon Allen is a 43 year old male who presents to the ER due to increase symptoms of depression. He was recently inpatient and stop taking he medications approximately a month ago. As a result, the symptoms return. He has used alcohol to cope with the depression, which caused the symptoms to increase and become more intense. Patient sleep has decrease, racing thoughts, feeling hopeless and helpless. Patient having thoughts of dying, and unable to motivate his self to do things he enjoy and it negatively impacting his job.   CCA Screening, Triage and Referral (STR)  Patient Reported Information How did you hear about us ? Self  What Is the Reason for Your Visit/Call Today? Patient states his depression is increasing and wanting to die.  How Long Has This Been Causing You Problems? 1 wk - 1 month  What Do You Feel Would Help You the Most Today? Treatment for Depression or other mood problem; Alcohol or Drug Use Treatment   Have You Recently Had Any Thoughts About Hurting Yourself? Yes  Are You Planning to Commit Suicide/Harm Yourself At This time? No   Flowsheet Row ED from 01/30/2024 in [facility] Emergency Department at [facility] ED from 01/08/2024 in [facility] Emergency Department at [facility] ED from 12/02/2023 in [facility] Emergency Department at [facility]  C-SSRS RISK CATEGORY Low Risk No Risk No Risk    Have you Recently Had Thoughts About Hurting Someone Vernon Allen? No  Are You Planning to Harm Someone at This Time? No  Explanation: N/A   Have You Used Any Alcohol or Drugs in the Past 24 Hours? Yes  How Long Ago Did You Use Drugs or Alcohol? No data recorded What Did You Use and How Much? No data recorded  Do You Currently Have a Therapist/Psychiatrist?  No  Name of Therapist/Psychiatrist:    Have You Been Recently Discharged From Any Office Practice or Programs? No  Explanation of Discharge From Practice/Program: No data recorded    CCA Screening Triage Referral Assessment Type of Contact: Face-to-Face  Telemedicine Service Delivery:   Is this Initial or Reassessment?   Date Telepsych consult ordered in [facility]:    Time Telepsych consult ordered in [facility]:    Location of Assessment: [facility] ED  Provider Location: [facility] ED   Collateral Involvement: Provider to contact patient's mother who is the pettioner   Does Patient Have a Automotive engineer Guardian? No  Legal Guardian Contact Information: N/A  Copy of Legal Guardianship Form: -- (N/A)  Legal Guardian Notified of Arrival: -- (N/A)  Legal Guardian Notified of Pending Discharge: -- (N/A)  If Minor and Not Living with Parent(s), Who has Custody? -- (N/A)  Is CPS involved or ever been involved? Never  Is APS involved or ever been involved? Never   Patient Determined To Be At Risk for Harm To Self or Others Based on Review of Patient Reported Information or Presenting Complaint? Yes, for Self-Harm (Patient denies but in the petition it is reported that he made the statement that he wanted to blow is brains out)  Method: Plan without intent  Availability of Means: Has close by  Intent: Vague intent or NA  Notification Required: No need or identified person  Additional Information for Danger to Others Potential: -- (N/A)  Additional Comments for Danger to Others Potential: N/A  Are There Guns or Other Weapons in Your Home? No  Types of Guns/Weapons: Shotguns  Are These Weapons Safely Secured?                            No  Who Could Verify You Are Able To Have These Secured: Gilfriend  Do You Have any Outstanding Charges, Pending Court Dates, Parole/Probation? Denies  Contacted To Inform of Risk of Harm To Self or Others: Other: Comment (Pt denies intent to harm  self or others)    Does Patient Present under Involuntary Commitment? No    [place] of Residence: Vernon Allen   Patient Currently Receiving the Following Services: Not Receiving Services   Determination of Need: Emergent (2 hours)   Options For Referral: Inpatient Hospitalization; ED Visit   CCA Biopsychosocial Patient Reported Schizophrenia/Schizoaffective Diagnosis in Past: No   Strengths: Seeking help, have support system and polite.   Mental Health Symptoms Depression:  Change in energy/activity; Difficulty Concentrating   Duration of Depressive symptoms: Duration of Depressive Symptoms: Greater than two weeks   Mania:  Change in energy/activity; Racing thoughts; Recklessness   Anxiety:   Difficulty concentrating; Restlessness   Psychosis:  None   Duration of Psychotic symptoms:    Trauma:  N/A   Obsessions:  N/A   Compulsions:  N/A   Inattention:  N/A   Hyperactivity/Impulsivity:  N/A   Oppositional/Defiant Behaviors:  N/A   Emotional Irregularity:  N/A   Other Mood/Personality Symptoms:  N/A    Mental Status Exam Appearance and self-care  Stature:  Average   Weight:  Average weight   Clothing:  Neat/clean; Age-appropriate   Grooming:  Normal   Cosmetic use:  None   Posture/gait:  Normal   Motor activity:  -- (Within normal range)   Sensorium  Attention:  Normal   Concentration:  Normal   Orientation:  X5   Recall/memory:  Normal   Affect and Mood  Affect:  Appropriate   Mood:  Depressed   Relating  Eye contact:  None   Facial expression:  Depressed   Attitude toward examiner:  Cooperative   Thought and Language  Speech flow: Clear and Coherent   Thought content:  Appropriate to Mood and Circumstances   Preoccupation:  None   Hallucinations:  None   Organization:  Coherent; Development worker, international aid of Knowledge:  Average   Intelligence:  Average   Abstraction:  Normal; Functional   Judgement:   Normal   Reality Testing:  Adequate   Insight:  Fair   Decision Making:  Normal   Social Functioning  Social Maturity:  Responsible   Social Judgement:  Normal   Stress  Stressors:  Relationship   Coping Ability:  Normal   Skill Deficits:  None   Supports:  Friends/Service system     Religion: Religion/Spirituality Are You A Religious Person?: No  Leisure/Recreation: Leisure / Recreation Do You Have Hobbies?: No  Exercise/Diet: Exercise/Diet Do You Exercise?: No Have You Gained or Lost A Significant Amount of Weight in the Past Six Months?: No Do You Follow a Special Diet?: No Do You Have Any Trouble Sleeping?: Yes Explanation of Sleeping Difficulties: Decrease sleep   CCA Employment/Education Employment/Work Situation: Employment / Work Situation Employment Situation: Employed Patient's Job has Been Impacted by Current Illness: No Has Patient ever Been in Equities trader?: No  Education: Education Is Patient Currently Attending School?: No Did You Product manager?: No Did  You Have An Individualized Education Program (IIEP): No Did You Have Any Difficulty At School?: No Patient's Education Has Been Impacted by Current Illness: No   CCA Family/Childhood History Family and Relationship History: Family history Marital status: Separated Does patient have children?: No  Childhood History:  Childhood History Did patient suffer any verbal/emotional/physical/sexual abuse as a child?: No Did patient suffer from severe childhood neglect?: No Has patient ever been sexually abused/assaulted/raped as an adolescent or adult?: No Was the patient ever a victim of a crime or a disaster?: No Witnessed domestic violence?: No Has patient been affected by domestic violence as an adult?: No       CCA Substance Use Alcohol/Drug Use: Alcohol / Drug Use Pain Medications: See MAR Prescriptions: See MAR Over the Counter: See MAR History of alcohol / drug use?:  Yes Longest period of sobriety (when/how long): Unable to quantify Substance #1 Name of Substance 1: Alcohol    ASAM's:  Six Dimensions of Multidimensional Assessment  Dimension 1:  Acute Intoxication and/or Withdrawal Potential:      Dimension 2:  Biomedical Conditions and Complications:      Dimension 3:  Emotional, Behavioral, or Cognitive Conditions and Complications:     Dimension 4:  Readiness to Change:     Dimension 5:  Relapse, Continued use, or Continued Problem Potential:     Dimension 6:  Recovery/Living Environment:     ASAM Severity Score:    ASAM Recommended Level of Treatment:     Substance use Disorder (SUD)    Recommendations for Services/Supports/Treatments:    Disposition Recommendation per psychiatric provider: Inpatient Treatment   DSM5 Diagnoses: Patient Active Problem List   Diagnosis Date Noted   Bipolar I disorder, single manic episode, severe, with psychosis (HCC) 06/16/2023   Major depressive disorder, recurrent severe without psychotic features (HCC) 06/15/2023   Bipolar I disorder, most recent episode depressed (HCC) 06/15/2023   Suicide ideation 06/15/2023   Substance induced mood disorder (HCC) 06/15/2023   EtOH dependence (HCC) 06/14/2023   Status post total hip replacement, right 04/11/2023   Elevated blood pressure reading without diagnosis of hypertension 03/22/2022   Arthritis 03/22/2022   Class 1 obesity without serious comorbidity with body mass index (BMI) of 34.0 to 34.9 in adult 03/22/2022   Tobacco abuse counseling 03/22/2022   Appendicitis 10/09/2018   Acute appendicitis    Perforated appendicitis 08/28/2018     Referrals to Alternative Service(s): Referred to Alternative Service(s):   Place:   Date:   Time:    Referred to Alternative Service(s):   Place:   Date:   Time:    Referred to Alternative Service(s):   Place:   Date:   Time:    Referred to Alternative Service(s):   Place:   Date:   Time:     Kiki DOROTHA Barge  MS, LCAS, [facility], [facility] Therapeutic Triage Specialist 01/30/2024 2:45 PM

## 2024-01-30 NOTE — ED Notes (Signed)
 VOL/  PENDING  CONSULT

## 2024-01-30 NOTE — Progress Notes (Addendum)
 Patient has been accepted to [facility] for 01/30/24. Patient was assigned to 400-2. Accepting physician is Dr. Marolyn Rosser. Call report to [phone]. Representative was Linsey.   ER Staff is aware of it: Lenward, ER Secretary Dr.Stafford, ER MD Alfonso PEAK, Patient's Nurse  Address:  [address]                 [place], [place] [address]

## 2024-01-30 NOTE — ED Notes (Signed)
 Meal provided

## 2024-01-30 NOTE — ED Notes (Addendum)
 Pt in interview room with psych team; ambulatory with steady gait

## 2024-01-30 NOTE — Consult Note (Signed)
 [facility] Psychiatric Consult Initial  Patient Name: .Vernon Allen  MRN: [id number]  DOB: 11-Feb-1981  Consult Order details:  Orders (From admission, onward)     Start     Ordered   01/30/24 1123  CONSULT TO CALL ACT TEAM       Ordering Provider: Viviann Pastor, MD  Provider:  (Not yet assigned)  Question:  Reason for Consult?  Answer:  Psych consult   01/30/24 1123   01/30/24 1123  IP CONSULT TO PSYCHIATRY       Ordering Provider: Viviann Pastor, MD  Provider:  (Not yet assigned)  Question Answer Comment  Consult Timeframe URGENT - requires response within 12 hours   URGENT timeframe requires provider to provider communication, has the provider to provider communication been completed Yes   Reason for Consult? Consult for medication management   Contact phone number where the requesting provider can be reached [phone]      01/30/24 1123             Mode of Visit: In person    Psychiatry Consult Evaluation  Service Date: January 30, 2024 LOS:  LOS: 0 days  Chief Complaint Tired of feeling the way I am  Primary Psychiatric Diagnoses  Bipolar 1 disorder vs. Substance Induced Mood disorder   Assessment  Vernon Allen is a 43 y.o. male admitted: Presented to the ED   Per EDP note: Vernon Allen is a 43 y.o. male with a history of bipolar disorder, alcohol abuse and dependence who comes ED complaining of increased depression symptoms and suicidal ideation.  Last drink was yesterday.  He has been spiraling lately.  Stopped his bipolar meds about a month ago.   Denies history of hallucinosis or seizure with alcohol withdrawal but does get shaky and sweaty.  On assessment today, patient endorses increased depression with symptoms of loss of pleasure/interest in activities, lack of motivation, persistent feelings of sadness and feelings of worthlessness severely impairing his personal and work life. Patient has noted history of bipolar 1 disorder-  verified by chart review. Patient did endorse daily drinking for the past 3 months that may be contributing to worsening symptom presentation as well. At this time, patient consents to voluntary admission to inpatient psychiatric unit and would benefit from further stabilization/management of current symptoms.   Diagnoses:  Active Hospital problems: Active Problems:   Bipolar I disorder, most recent episode depressed (HCC)   Substance induced mood disorder (HCC)    Plan   ## Psychiatric Medication Recommendations:  Defer to inpatient admission- patient on CIWA protocol at this time   ## Medical Decision Making Capacity: Not specifically addressed in this encounter  ## Further Work-up:   -- most recent EKG on 01/30/2024 had QtC of 416 -- Pertinent labwork reviewed earlier this admission includes: urine drug screen, cmp, cbc, ethanol   ## Disposition:-- patient consents to voluntary admission to inpatient psychiatric unit  ## Behavioral / Environmental: - No specific recommendations at this time.     ## Safety and Observation Level:  - Based on my clinical evaluation, I estimate the patient to be at low risk of self harm in the current setting. - At this time, we recommend  routine. This decision is based on my review of the chart including patient's history and current presentation, interview of the patient, mental status examination, and consideration of suicide risk including evaluating suicidal ideation, plan, intent, suicidal or self-harm behaviors, risk factors, and protective factors. This judgment is  based on our ability to directly address suicide risk, implement suicide prevention strategies, and develop a safety plan while the patient is in the clinical setting. Please contact our team if there is a concern that risk level has changed.  CSSR Risk Category:C-SSRS RISK CATEGORY: Low Risk  Suicide Risk Assessment: Patient has following modifiable risk factors for suicide:  medication noncompliance and lack of access to outpatient mental health resources, which we are addressing by recommending inpatient admission for further stabilization. Patient has following non-modifiable or demographic risk factors for suicide: male gender and psychiatric hospitalization Patient has the following protective factors against suicide: Supportive family, Cultural, spiritual, or religious beliefs that discourage suicide, and no history of suicide attempts  Thank you for this consult request. Recommendations have been communicated to the primary team.  We will sign off at this time.   Zelda Sharps, NP    History of Present Illness  Relevant Aspects of Hospital ED   Patient Report:  On assessment today, patient endorses increased depression with symptoms of loss of pleasure/interest in activities, lack of motivation, persistent feelings of sadness and feelings of worthlessness severely impairing his personal and work life. Patient has noted history of bipolar 1 disorder- verified by chart review. Patient did endorse daily drinking for the past 3 months that may be contributing to worsening symptom presentation as well. At this time, patient consents to voluntary admission to inpatient psychiatric unit and would benefit from further stabilization/management of current symptoms.   Patient reported diagnosed history of bipolar disorder which he believes he was taking Cymbalta  for.  He is unsure of what other medications he was prescribed but knows he was taking other medications.  He reported being out of his current medication regimen for around the past 2 months.  He reported since then, he began drinking heavily to help cope.  He reported worsening depressive symptoms as listed above.  He was tearful during the assessment today.  He denied any suicidal or homicidal thoughts currently.  He denied any auditory or visual hallucinations.On current presentation there was no evidence of  psychosis and patient did not appear to be responding to internal stimuli.  Patient reported he will typically drink a pint of liquor and a 12 pack in a day or 3 boot ligatures and 12 beers in a day.  He reported also using NyQuil capsules nightly for the past 2 weeks to help try to sleep.  He denies doing so as a self-harm attempt.  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  He reported he has not been to work since last Thursday due to feeling ashamed.  He reported his coworkers caught him and alcoholic frequently and he is ashamed to show back up to work.  He denied any other illicit drug use.  He did report smoking cigarettes but reported 1 pack of cigarettes last him around 2 to 3 weeks.  He reported believing that his last manic episode was the last time he was admitted to the inpatient psychiatric unit.  He reports currently he is suffering more with depressive symptoms.  He reported in the past he believes his manic symptoms only last around 3 days before he crashes.  He rates his depression 10 out of 10 and anxiety 9 out of 10.  He reported living with his wife and son.  He did report that his son has a shotgun but it is in a locked safe that patient has no access to.  He  has not followed up with outpatient psychiatry due to returning back to work and unable to afford follow-up care at this time.  He denied attending any therapy as well.  He did report the medications he was started on last admissions, he felt like changed his personality too much.  Patient was tearful during interview, but remained calm and cooperative with psychiatry team.  Psych ROS:  Depression: Endorsed Anxiety:  Endorsed Mania (lifetime and current): Yes Psychosis: (lifetime and current): Yes      Psychiatric and Social History  Psychiatric History:  Information collected from Patient/chart review  Prev Dx/Sx: Bipolar 1 disorder, alcohol use disorder Current Psych Provider: Denied- had not  made it to follow up appt Home Meds (current): Believes cymbalta , unsure of others Previous Med Trials: Unsure Therapy: No  Prior Psych Hospitalization: Yes  Prior Self Harm: Denied Prior Violence: Denied  Family Psych History: Denied Family Hx suicide: Denied  Social History:   Educational Hx: Unknown Occupational Hx: Nurse, children's Hx: Denied Living Situation: with wife and 56 year old son Spiritual Hx: Christianity  Access to weapons/lethal means: Reported son has gun in locked safe at home   Substance History Alcohol: Daily for past 2-3 months  Type of alcohol Liquor and beer Last Drink Last night Number of drinks per day see above History of alcohol withdrawal seizures Denied History of DT's Denied Tobacco: Endorsed- see above Illicit drugs: Denied Prescription drug abuse: Denied Rehab hx: Denied  Exam Findings  Physical Exam: Deferred to EDP- note reviewed   Vital Signs:  Temp:  [98.9 F (37.2 C)] 98.9 F (37.2 C) (10/09 1027) Pulse Rate:  [84-88] 88 (10/09 1409) Resp:  [18] 18 (10/09 1409) BP: (128-146)/(84-102) 128/84 (10/09 1409) SpO2:  [98 %-99 %] 99 % (10/09 1409) Weight:  [104.3 kg] 104.3 kg (10/09 1027) Blood pressure 128/84, pulse 88, temperature 98.9 F (37.2 C), temperature source Oral, resp. rate 18, height 5' 9 (1.753 m), weight 104.3 kg, SpO2 99%. Body mass index is 33.97 kg/m.    Mental Status Exam: General Appearance: Casual  Orientation:  Full (Time, Place, and Person)  Memory:  Immediate;   Fair Recent;   Fair Remote;   Fair  Concentration:  Concentration: Fair and Attention Span: Fair  Recall:  Fair  Attention  Fair  Eye Contact:  Fair  Speech:  Clear and Coherent  Language:  Good  Volume:  Normal  Mood: Tired  Affect:  Appropriate and Congruent  Thought Process:  Coherent and Goal Directed  Thought Content:  WDL  Suicidal Thoughts:  No  Homicidal Thoughts:  No  Judgement:  Fair  Insight:  Fair  Psychomotor  Activity:  Restlessness  Akathisia:  No  Fund of Knowledge:  Fair      Assets:  Manufacturing systems engineer Desire for Improvement Housing Social Support  Cognition:  WNL  ADL's:  Intact  AIMS (if indicated):        Other History   These have been pulled in through the EMR, reviewed, and updated if appropriate.  Family History:  The patient's family history is not on file. He was adopted.  Medical History: Past Medical History:  Diagnosis Date   Alcohol abuse    Allergy to alpha-gal    Anxiety    Avascular necrosis of bone of right hip (HCC) 02/2023   Bipolar 1 disorder (HCC)    Elevated blood pressure reading with diagnosis of hypertension    Perforated appendicitis 10/09/2018   Perirectal abscess 05/19/2021  Surgical History: Past Surgical History:  Procedure Laterality Date   BACK SURGERY  2008   L4-5 ruptured disc   INCISION AND DRAINAGE PERIRECTAL ABSCESS Left 05/19/2021   LAPAROSCOPIC APPENDECTOMY N/A 10/09/2018   Procedure: APPENDECTOMY LAPAROSCOPIC ATTEMPTED;  Surgeon: Jordis Laneta FALCON, MD;  Location: [facility] ORS;  Service: General;  Laterality: N/A;   PARTIAL COLECTOMY Right 10/09/2018   Procedure: PARTIAL COLECTOMY;  Surgeon: Jordis Laneta FALCON, MD;  Location: [facility] ORS;  Service: General;  Laterality: Right;   TOTAL HIP ARTHROPLASTY Right 04/11/2023   Procedure: TOTAL HIP ARTHROPLASTY;  Surgeon: Edie Norleen PARAS, MD;  Location: [facility] ORS;  Service: Orthopedics;  Laterality: Right;     Medications:   Current Facility-Administered Medications:    alum & mag hydroxide-simeth (MAALOX/MYLANTA) 200-200-20 MG/5ML suspension 30 mL, 30 mL, Oral, Q6H PRN, Viviann Pastor, MD   LORazepam  (ATIVAN ) injection 0-4 mg, 0-4 mg, Intravenous, Q6H **OR** LORazepam  (ATIVAN ) tablet 0-4 mg, 0-4 mg, Oral, Q6H, Viviann Pastor, MD   NOREEN ON 02/01/2024] LORazepam  (ATIVAN ) injection 0-4 mg, 0-4 mg, Intravenous, Q12H **OR** [START ON 02/01/2024] LORazepam  (ATIVAN ) tablet 0-4 mg, 0-4 mg, Oral,  Q12H, Viviann Pastor, MD   ondansetron  (ZOFRAN ) tablet 4 mg, 4 mg, Oral, Q8H PRN, Viviann Pastor, MD, 4 mg at 01/30/24 1224   thiamine  (VITAMIN B1) tablet 100 mg, 100 mg, Oral, Daily, 100 mg at 01/30/24 1224 **OR** thiamine  (VITAMIN B1) injection 100 mg, 100 mg, Intravenous, Daily, Viviann Pastor, MD  Current Outpatient Medications:    melatonin 5 MG TABS, Take 1 tablet (5 mg total) by mouth at bedtime., Disp: 30 tablet, Rfl: 0   ARIPiprazole  (ABILIFY ) 5 MG tablet, Take 1 tablet (5 mg total) by mouth daily. (Patient not taking: Reported on 01/30/2024), Disp: 30 tablet, Rfl: 0   cyclobenzaprine  (FLEXERIL ) 5 MG tablet, Take 1 tablet (5 mg total) by mouth 3 (three) times daily as needed. (Patient not taking: Reported on 01/30/2024), Disp: 30 tablet, Rfl: 0   DULoxetine  (CYMBALTA ) 20 MG capsule, Take 1 capsule (20 mg total) by mouth at bedtime. (Patient not taking: Reported on 01/30/2024), Disp: 30 capsule, Rfl: 0   oxyCODONE  (OXY IR/ROXICODONE ) 5 MG immediate release tablet, Take 1-2 tablets (5-10 mg total) by mouth every 4 (four) hours as needed for moderate pain (pain score 4-6). (Patient not taking: Reported on 01/30/2024), Disp: 40 tablet, Rfl: 0   topiramate  (TOPAMAX ) 50 MG tablet, Take 1 tablet (50 mg total) by mouth daily. (Patient not taking: Reported on 01/30/2024), Disp: 30 tablet, Rfl: 0  Allergies: Allergies  Allergen Reactions   Bovine (Beef) Protein-Containing Drug Products Anaphylaxis   Keflex [Cephalexin] Anaphylaxis   Mobic [Meloxicam] Anaphylaxis   Porcine (Pork) Protein-Containing Drug Products Anaphylaxis   Bc Fast Pain Relief [Aspirin-Salicylamide-Caffeine] Hives   Ciprofloxacin  Hives   Esomeprazole Magnesium  Hives    Zelda Sharps, NP
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
## 2024-01-30 NOTE — ED Notes (Signed)
Pt provided with snacks and ginger ale

## 2024-01-30 NOTE — ED Notes (Signed)
Pt given phone to call his wife.

## 2024-01-30 NOTE — ED Notes (Signed)
 Pt c/o worsening withdrawal symptoms of worsening headache and states I'm starting to sweat. CIWA score adjusted appropriately.
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
## 2024-01-30 NOTE — ED Notes (Signed)
 Pt stated all belongings were sent with his mom

## 2024-01-30 NOTE — Group Note (Deleted)
 Date:  01/30/2024 Time:  8:55 PM  Group Topic/Focus:  Wrap-Up Group:   The focus of this group is to help patients review their daily goal of treatment and discuss progress on daily workbooks.    Participation Level:  {BHH PARTICIPATION OZCZO:77735}  Participation Quality:  {BHH PARTICIPATION QUALITY:22265}  Affect:  {BHH AFFECT:22266}  Cognitive:  {BHH COGNITIVE:22267}  Insight: {BHH Insight2:20797}  Engagement in Group:  {BHH ENGAGEMENT IN HMNLE:77731}  Modes of Intervention:  {BHH MODES OF INTERVENTION:22269}  Additional Comments:  ***  Eward Mace 01/30/2024, 8:55 PM

## 2024-01-30 NOTE — ED Notes (Signed)
 Pt dressed out into hospital attire. Pt belongings to include: 1 blue shirt 1 black hat 1 gray pants 2 black shoes 2 white socks 1 black phone 1 black wallet 1 gray underwear  Mom to come and take all belongings home per pt.

## 2024-01-30 NOTE — ED Notes (Signed)
Pt returned to stretcher

## 2024-01-31 ENCOUNTER — Encounter (HOSPITAL_COMMUNITY): Payer: Self-pay

## 2024-01-31 DIAGNOSIS — M549 Dorsalgia, unspecified: Secondary | ICD-10-CM

## 2024-01-31 DIAGNOSIS — F1994 Other psychoactive substance use, unspecified with psychoactive substance-induced mood disorder: Secondary | ICD-10-CM

## 2024-01-31 DIAGNOSIS — Z720 Tobacco use: Secondary | ICD-10-CM

## 2024-01-31 DIAGNOSIS — F1020 Alcohol dependence, uncomplicated: Secondary | ICD-10-CM

## 2024-01-31 DIAGNOSIS — M1990 Unspecified osteoarthritis, unspecified site: Secondary | ICD-10-CM

## 2024-01-31 DIAGNOSIS — G8929 Other chronic pain: Secondary | ICD-10-CM

## 2024-01-31 DIAGNOSIS — F312 Bipolar disorder, current episode manic severe with psychotic features: Secondary | ICD-10-CM

## 2024-01-31 MED ORDER — CHLORDIAZEPOXIDE HCL 5 MG PO CAPS
5.0000 mg | ORAL_CAPSULE | Freq: Three times a day (TID) | ORAL | Status: AC
  Administered 2024-02-03 – 2024-02-04 (×4): 5 mg via ORAL
  Filled 2024-01-31 (×4): qty 1

## 2024-01-31 MED ORDER — MIRTAZAPINE 15 MG PO TABS
15.0000 mg | ORAL_TABLET | Freq: Every day | ORAL | Status: DC
  Administered 2024-01-31 – 2024-02-01 (×2): 15 mg via ORAL
  Filled 2024-01-31 (×2): qty 1

## 2024-01-31 MED ORDER — CHLORDIAZEPOXIDE HCL 5 MG PO CAPS
10.0000 mg | ORAL_CAPSULE | Freq: Three times a day (TID) | ORAL | Status: AC
  Administered 2024-02-01 – 2024-02-02 (×4): 10 mg via ORAL
  Filled 2024-01-31 (×4): qty 2

## 2024-01-31 MED ORDER — GABAPENTIN 100 MG PO CAPS
100.0000 mg | ORAL_CAPSULE | Freq: Three times a day (TID) | ORAL | Status: DC
Start: 2024-01-31 — End: 2024-02-04
  Administered 2024-01-31 – 2024-02-04 (×12): 100 mg via ORAL
  Filled 2024-01-31 (×11): qty 1

## 2024-01-31 MED ORDER — PANTOPRAZOLE SODIUM 40 MG PO TBEC
40.0000 mg | DELAYED_RELEASE_TABLET | Freq: Every day | ORAL | Status: DC
  Administered 2024-01-31 – 2024-02-11 (×12): 40 mg via ORAL
  Filled 2024-01-31 (×4): qty 1
  Filled 2024-01-31: qty 14
  Filled 2024-01-31 (×5): qty 1
  Filled 2024-01-31: qty 14
  Filled 2024-01-31 (×2): qty 1

## 2024-01-31 MED ORDER — MELATONIN 5 MG PO TABS
5.0000 mg | ORAL_TABLET | Freq: Every day | ORAL | Status: DC
  Administered 2024-01-31 – 2024-02-10 (×6): 5 mg via ORAL
  Filled 2024-01-31 (×2): qty 1
  Filled 2024-01-31: qty 14
  Filled 2024-01-31: qty 1
  Filled 2024-01-31: qty 14
  Filled 2024-01-31 (×5): qty 1

## 2024-01-31 MED ORDER — CHLORDIAZEPOXIDE HCL 25 MG PO CAPS
25.0000 mg | ORAL_CAPSULE | Freq: Three times a day (TID) | ORAL | Status: AC
  Administered 2024-01-31 – 2024-02-01 (×4): 25 mg via ORAL
  Filled 2024-01-31 (×4): qty 1

## 2024-01-31 NOTE — Group Note (Signed)
 Date:  01/31/2024 Time:  6:09 PM  Group Topic/Focus:  Coping With Mental Health Crisis:   The purpose of this group is to help patients identify strategies for coping with mental health crisis.  Group discusses possible causes of crisis and ways to manage them effectively. Developing a Wellness Toolbox:   The focus of this group is to help patients develop a wellness toolbox with skills and strategies to promote recovery upon discharge. Self Care:   The focus of this group is to help patients understand the importance of self-care in order to improve or restore emotional, physical, spiritual, interpersonal, and financial health.    Participation Level:  Did Not Attend  Participation Quality:    Affect:    Cognitive:    Insight:   Engagement in Group:    Modes of Intervention:    Additional Comments:   Eleanor JAYSON Metro 01/31/2024, 6:09 PM

## 2024-01-31 NOTE — Progress Notes (Signed)
 Ozell CROME Tusing   Type of Note: Work Note   Pt requested work note to be sent to job today to inform them of hospitalization. Per request, generic work note has been faxed to [employer] in [place] [phone], number provided by pt.   Signed:  Keegan Bensch, LCSW-A 01/31/2024  12:49 PM

## 2024-01-31 NOTE — Group Note (Signed)
 Date:  01/31/2024 Time:  9:57 PM  Group Topic/Focus:  Wrap-Up Group:   The focus of this group is to help patients review their daily goal of treatment and discuss progress on daily workbooks.    Participation Level:  Did Not Attend  Participation Quality:  DNA  Affect:  DNA  Cognitive:  DNA  Insight: Limited  Engagement in Group:  None  Modes of Intervention:  Discussion  Additional Comments:  Pt did not attend group.  Rutherford JINNY Bend 01/31/2024, 9:57 PM

## 2024-01-31 NOTE — Progress Notes (Signed)
   01/31/24 0920  Psych Admission Type (Psych Patients Only)  Admission Status Voluntary  Psychosocial Assessment  Patient Complaints Anxiety;Depression;Substance abuse  Eye Contact Fair  Facial Expression Anxious  Affect Anxious;Depressed  Speech Logical/coherent  Interaction Assertive  Motor Activity Slow  Appearance/Hygiene Improved  Behavior Characteristics Appropriate to situation  Mood Depressed;Anxious  Thought Process  Coherency WDL  Content WDL  Delusions None reported or observed  Perception WDL  Hallucination None reported or observed  Judgment Impaired  Confusion None  Danger to Self  Current suicidal ideation? Denies  Agreement Not to Harm Self Yes  Description of Agreement Verbal  Danger to Others  Danger to Others None reported or observed

## 2024-01-31 NOTE — Group Note (Signed)
 Recreation Therapy Group Note   Group Topic:General Recreation  Group Date: 01/31/2024 Start Time: 0930 End Time: 1000 Facilitators: Jeremie Abdelaziz-McCall, LRT,CTRS Location: [address] Dayroom   Group Topic/Focus: General Recreation   Goal Area(s) Addresses:  Patient will use appropriate interactions with peers.   Patient will work together to come up with answers.  Behavioral Response:   Intervention: Worksheet, Music  Activity: Dentist. Patients were given a worksheet (front and back) of brain teasers. Patients could work together to figure out each individual puzzle presented. While working, LRT played music. LRT and patients went over the answers when patients completed the assignment.     Affect/Mood: N/A   Participation Level: Did not attend    Clinical Observations/Individualized Feedback:     Plan: Continue to engage patient in RT group sessions 2-3x/week.   Harless Molinari-McCall, LRT,CTRS 01/31/2024 11:42 AM

## 2024-01-31 NOTE — Plan of Care (Signed)
  Problem: Activity: Goal: Interest or engagement in activities will improve Outcome: Progressing Goal: Sleeping patterns will improve Outcome: Progressing   Problem: Coping: Goal: Ability to verbalize frustrations and anger appropriately will improve Outcome: Progressing Goal: Ability to demonstrate self-control will improve Outcome: Progressing   Problem: Safety: Goal: Ability to remain free from injury will improve Outcome: Progressing   Problem: Medication: Goal: Compliance with prescribed medication regimen will improve Outcome: Progressing
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
## 2024-01-31 NOTE — BH IP Treatment Plan (Signed)
 Interdisciplinary Treatment and Diagnostic Plan Update  01/31/2024 Time of Session: 10:35 AM Vernon Allen MRN: [id number]  Principal Diagnosis: Bipolar I disorder, most recent episode depressed (HCC)  Secondary Diagnoses: Principal Problem:   Bipolar I disorder, most recent episode depressed (HCC) Active Problems:   Arthritis   EtOH dependence (HCC)   Major depressive disorder, recurrent severe without psychotic features (HCC)   Substance induced mood disorder (HCC)   Bipolar I disorder, single manic episode, severe, with psychosis (HCC)   Alcohol use disorder   Tobacco use disorder   Chronic back pain   Current Medications:  Current Facility-Administered Medications  Medication Dose Route Frequency Provider Last Rate Last Admin   acetaminophen  (TYLENOL ) tablet 650 mg  650 mg Oral Q6H PRN Smith, Annie B, NP   650 mg at 01/31/24 1448   alum & mag hydroxide-simeth (MAALOX/MYLANTA) 200-200-20 MG/5ML suspension 30 mL  30 mL Oral Q4H PRN Smith, Annie B, NP       chlordiazePOXIDE (LIBRIUM) capsule 25 mg  25 mg Oral TID Leigh Corean Massa, MD   25 mg at 01/31/24 1714   Followed by   NOREEN ON 02/01/2024] chlordiazePOXIDE (LIBRIUM) capsule 10 mg  10 mg Oral TID Leigh Corean Massa, MD       Followed by   NOREEN ON 02/03/2024] chlordiazePOXIDE (LIBRIUM) capsule 5 mg  5 mg Oral TID Leigh Corean Massa, MD       haloperidol  (HALDOL ) tablet 5 mg  5 mg Oral TID PRN Smith, Annie B, NP       And   diphenhydrAMINE  (BENADRYL ) capsule 50 mg  50 mg Oral TID PRN Smith, Annie B, NP       haloperidol  lactate (HALDOL ) injection 5 mg  5 mg Intramuscular TID PRN Smith, Annie B, NP       And   diphenhydrAMINE  (BENADRYL ) injection 50 mg  50 mg Intramuscular TID PRN Smith, Annie B, NP       And   LORazepam  (ATIVAN ) injection 2 mg  2 mg Intramuscular TID PRN Smith, Annie B, NP       haloperidol  lactate (HALDOL ) injection 10 mg  10 mg Intramuscular TID PRN Smith, Annie B, NP       And    diphenhydrAMINE  (BENADRYL ) injection 50 mg  50 mg Intramuscular TID PRN Smith, Annie B, NP       And   LORazepam  (ATIVAN ) injection 2 mg  2 mg Intramuscular TID PRN Smith, Annie B, NP       EPINEPHrine  (EPI-PEN) injection 0.3 mg  0.3 mg Intramuscular PRN Trudy Carwin, NP       feeding supplement (ENSURE PLUS HIGH PROTEIN) liquid 237 mL  237 mL Oral BID BM McLauchlin, Angela, NP   237 mL at 01/31/24 1439   folic acid (FOLVITE) tablet 1 mg  1 mg Oral Daily Smith, Annie B, NP   1 mg at 01/31/24 9182   gabapentin  (NEURONTIN ) capsule 100 mg  100 mg Oral TID Leigh Corean Massa, MD   100 mg at 01/31/24 1714   LORazepam  (ATIVAN ) tablet 0-4 mg  0-4 mg Oral Q6H Smith, Annie B, NP   1 mg at 01/31/24 1438   Followed by   NOREEN ON 02/01/2024] LORazepam  (ATIVAN ) tablet 0-4 mg  0-4 mg Oral Q12H Smith, Annie B, NP       magnesium  hydroxide (MILK OF MAGNESIA) suspension 30 mL  30 mL Oral Daily PRN Smith, Annie B, NP       melatonin  tablet 5 mg  5 mg Oral QHS Hill, Corean Massa, MD       mirtazapine (REMERON) tablet 15 mg  15 mg Oral QHS Hill, Corean Massa, MD       multivitamin with minerals tablet 1 tablet  1 tablet Oral Daily Smith, Annie B, NP   1 tablet at 01/31/24 9182   ondansetron  (ZOFRAN ) tablet 4 mg  4 mg Oral Q8H PRN Smith, Annie B, NP   4 mg at 01/30/24 2118   pantoprazole  (PROTONIX ) EC tablet 40 mg  40 mg Oral Daily Leigh Corean Massa, MD   40 mg at 01/31/24 1242   thiamine  (Vitamin B-1) tablet 100 mg  100 mg Oral Daily Smith, Annie B, NP   100 mg at 01/31/24 9182   Or   thiamine  (VITAMIN B1) injection 100 mg  100 mg Intravenous Daily Smith, Annie B, NP       traZODone  (DESYREL ) tablet 50 mg  50 mg Oral QHS McLauchlin, Angela, NP   50 mg at 01/30/24 2239   PTA Medications: Medications Prior to Admission  Medication Sig Dispense Refill Last Dose/Taking   ARIPiprazole  (ABILIFY ) 5 MG tablet Take 1 tablet (5 mg total) by mouth daily. (Patient not taking: Reported on 01/30/2024) 30 tablet  0    cyclobenzaprine  (FLEXERIL ) 5 MG tablet Take 1 tablet (5 mg total) by mouth 3 (three) times daily as needed. (Patient not taking: Reported on 01/30/2024) 30 tablet 0    DULoxetine  (CYMBALTA ) 20 MG capsule Take 1 capsule (20 mg total) by mouth at bedtime. (Patient not taking: Reported on 01/30/2024) 30 capsule 0    melatonin 5 MG TABS Take 1 tablet (5 mg total) by mouth at bedtime. 30 tablet 0    oxyCODONE  (OXY IR/ROXICODONE ) 5 MG immediate release tablet Take 1-2 tablets (5-10 mg total) by mouth every 4 (four) hours as needed for moderate pain (pain score 4-6). (Patient not taking: Reported on 01/30/2024) 40 tablet 0    topiramate  (TOPAMAX ) 50 MG tablet Take 1 tablet (50 mg total) by mouth daily. (Patient not taking: Reported on 01/30/2024) 30 tablet 0     Patient Stressors: Medication change or noncompliance   Substance abuse    Patient Strengths: Forensic psychologist fund of knowledge  Motivation for treatment/growth  Supportive family/friends   Treatment Modalities: Medication Management, Group therapy, Case management,  1 to 1 session with clinician, Psychoeducation, Recreational therapy.   Physician Treatment Plan for Primary Diagnosis: Bipolar I disorder, most recent episode depressed (HCC) Long Term Goal(s): Improvement in symptoms so as ready for discharge   Short Term Goals: Ability to identify changes in lifestyle to reduce recurrence of condition will improve Ability to verbalize feelings will improve Ability to disclose and discuss suicidal ideas Ability to demonstrate self-control will improve Ability to identify and develop effective coping behaviors will improve Ability to maintain clinical measurements within normal limits will improve Compliance with prescribed medications will improve Ability to identify triggers associated with substance abuse/mental health issues will improve  Medication Management: Evaluate patient's response, side effects, and tolerance  of medication regimen.  Therapeutic Interventions: 1 to 1 sessions, Unit Group sessions and Medication administration.  Evaluation of Outcomes: Not Progressing  Physician Treatment Plan for Secondary Diagnosis: Principal Problem:   Bipolar I disorder, most recent episode depressed (HCC) Active Problems:   Arthritis   EtOH dependence (HCC)   Major depressive disorder, recurrent severe without psychotic features (HCC)   Substance induced mood disorder (HCC)  Bipolar I disorder, single manic episode, severe, with psychosis (HCC)   Alcohol use disorder   Tobacco use disorder   Chronic back pain  Long Term Goal(s): Improvement in symptoms so as ready for discharge   Short Term Goals: Ability to identify changes in lifestyle to reduce recurrence of condition will improve Ability to verbalize feelings will improve Ability to disclose and discuss suicidal ideas Ability to demonstrate self-control will improve Ability to identify and develop effective coping behaviors will improve Ability to maintain clinical measurements within normal limits will improve Compliance with prescribed medications will improve Ability to identify triggers associated with substance abuse/mental health issues will improve     Medication Management: Evaluate patient's response, side effects, and tolerance of medication regimen.  Therapeutic Interventions: 1 to 1 sessions, Unit Group sessions and Medication administration.  Evaluation of Outcomes: Not Progressing   RN Treatment Plan for Primary Diagnosis: Bipolar I disorder, most recent episode depressed (HCC) Long Term Goal(s): Knowledge of disease and therapeutic regimen to maintain health will improve  Short Term Goals: Ability to remain free from injury will improve, Ability to verbalize frustration and anger appropriately will improve, Ability to demonstrate self-control, Ability to participate in decision making will improve, Ability to verbalize  feelings will improve, Ability to disclose and discuss suicidal ideas, Ability to identify and develop effective coping behaviors will improve, and Compliance with prescribed medications will improve  Medication Management: RN will administer medications as ordered by provider, will assess and evaluate patient's response and provide education to patient for prescribed medication. RN will report any adverse and/or side effects to prescribing provider.  Therapeutic Interventions: 1 on 1 counseling sessions, Psychoeducation, Medication administration, Evaluate responses to treatment, Monitor vital signs and CBGs as ordered, Perform/monitor CIWA, COWS, AIMS and Fall Risk screenings as ordered, Perform wound care treatments as ordered.  Evaluation of Outcomes: Not Progressing   LCSW Treatment Plan for Primary Diagnosis: Bipolar I disorder, most recent episode depressed (HCC) Long Term Goal(s): Safe transition to appropriate next level of care at discharge, Engage patient in therapeutic group addressing interpersonal concerns.  Short Term Goals: Engage patient in aftercare planning with referrals and resources, Increase social support, Increase ability to appropriately verbalize feelings, Increase emotional regulation, Facilitate acceptance of mental health diagnosis and concerns, Facilitate patient progression through stages of change regarding substance use diagnoses and concerns, Identify triggers associated with mental health/substance abuse issues, and Increase skills for wellness and recovery  Therapeutic Interventions: Assess for all discharge needs, 1 to 1 time with Social worker, Explore available resources and support systems, Assess for adequacy in community support network, Educate family and significant other(s) on suicide prevention, Complete Psychosocial Assessment, Interpersonal group therapy.  Evaluation of Outcomes: Not Progressing   Progress in Treatment: Attending groups:  No. Participating in groups: No. Taking medication as prescribed: Yes. Toleration medication: Yes. Family/Significant other contact made: No, will contact:  Burnard Right, significant other [phone] (lives together) OR Mom Andres [phone] Patient understands diagnosis: Yes. Discussing patient identified problems/goals with staff: Yes. Medical problems stabilized or resolved: Yes. Denies suicidal/homicidal ideation: Yes. Issues/concerns per patient self-inventory: No.  New problem(s) identified:  No  New Short Term/Long Term Goal(s):  detox, medication management for mood stabilization; elimination of SI thoughts; development of comprehensive mental wellness/sobriety plan   Patient Goals:  I'm trying to detox from alcohol and start taking my bipolar mediations.  I feel depressed, and it's harder to face people.  I want to get myself right so I can be  there for my family.  I have a home to go to, and I'm trying to keep my job.    Discharge Plan or Barriers:  Patient recently admitted. CSW will continue to follow and assess for appropriate referrals and possible discharge planning.    Reason for Continuation of Hospitalization: Depression Medication stabilization  Estimated Length of Stay:  5 - 7 days  Last 3 [place] Suicide Severity Risk Score: Flowsheet Row [facility] (Current[facility] Most recent reading at 01/30/2024  8:36 PM ED from 01/30/2024 in [facility] Emergency Department at [facility] Most recent reading at 01/30/2024  1:38 PM ED from 01/08/2024 in [facility] Emergency Department at [facility] Most recent reading at 01/08/2024  9:52 AM  C-SSRS RISK CATEGORY Low Risk Low Risk No Risk    Last PHQ 2/9 Scores:    06/14/2023    3:21 PM 03/02/2022    2:07 PM  Depression screen PHQ 2/9  Decreased Interest 0 0  Down, Depressed, Hopeless 0 0  PHQ - 2 Score 0 0    Scribe for Treatment  Team: Barbaraann Avans O Keyonia Gluth, LCSWA 01/31/2024 7:59 PM

## 2024-01-31 NOTE — Progress Notes (Signed)
(  Sleep Hours) - 7.5 (Any PRNs that were needed, meds refused, or side effects to meds)- Ativan  for [place], Zofran  for Nausea, and Tylenol  for headache (Any disturbances and when (visitation, over night)-  Withdrawal (Concerns raised by the patient)-  Substance abuse (SI/HI/AVH)- Denies
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
## 2024-01-31 NOTE — Plan of Care (Signed)
   Problem: Education: Goal: Mental status will improve Outcome: Progressing Goal: Verbalization of understanding the information provided will improve Outcome: Progressing   Problem: Activity: Goal: Interest or engagement in activities will improve Outcome: Progressing

## 2024-01-31 NOTE — H&P (Signed)
 Psychiatric Admission Assessment Adult  Patient Identification: Vernon Allen MRN:  [id number] Date of Evaluation:  01/31/2024 Chief Complaint:  Alcohol use disorder [F10.90] Principal Diagnosis: Bipolar I disorder, most recent episode depressed (HCC) Diagnosis:  Principal Problem:   Bipolar I disorder, most recent episode depressed (HCC) Active Problems:   Arthritis   EtOH dependence (HCC)   Major depressive disorder, recurrent severe without psychotic features (HCC)   Substance induced mood disorder (HCC)   Bipolar I disorder, single manic episode, severe, with psychosis (HCC)   Alcohol use disorder   Tobacco use disorder   Chronic back pain  History of Present Illness: 43 YO M with a history of anxiety, bipolar and depression and alcohol dependence who presented to the [facility] seeking detox and treatment for his mood disorder following 3 months of medication non-compliance and increase in drinking behaviors.              The patient was seen this morning in treatment team and again later in the afternoon. He explains that he has been drinking for years, but a few months ago he increased his intake to 3-4 boot leggers + 7-8 beers due to poor sleep. He has also been having a couple of absences from work, and now his coworkers are making accusations and engaging in bullying behavior. Because of this, he has been ashamed of who I was, so I just chose to lay around my house and drink. He then started missing his son's football games and he feels very guilty, hopeless and like he is a bad father. His self esteem is very low. He has chronic pain. He is unable to quit drinking on his own and has a history of having withdrawal symptoms (but not seizures). He is not currently feeling suicidal. He is not having hallucinations or delusions. He blames himself and states several times I just don't like myself anymore.   Associated Signs/Symptoms: Depression Symptoms:  depressed  mood, insomnia, fatigue, feelings of worthlessness/guilt, difficulty concentrating, hopelessness, anxiety, loss of energy/fatigue, decreased appetite, (Hypo) Manic Symptoms:  NA Anxiety Symptoms:  Excessive Worry, Social Anxiety, Psychotic Symptoms:  NA PTSD Symptoms: Negative Total Time spent with patient: 1 hour  Past Psychiatric History: has been to [facility] and [facility] in the past. Previously tried on topamax  and trazodone . Had Ritalin as a child and did not tolerate it. Remote suicide attempt in teens. No current outpatient provider.   Is the patient at risk to self? No.  Has the patient been a risk to self in the past 6 months? Yes.    Has the patient been a risk to self within the distant past? Yes.    Is the patient a risk to others? No.  Has the patient been a risk to others in the past 6 months? No.  Has the patient been a risk to others within the distant past? No.   [place] Scale:  Flowsheet Row [facility] (Current) [facility] Most recent reading at 01/30/2024  8:36 PM ED from 01/30/2024 in [facility] Emergency Department at [facility] Most recent reading at 01/30/2024  1:38 PM ED from 01/08/2024 in [facility] Emergency Department at [facility] Most recent reading at 01/08/2024  9:52 AM  C-SSRS RISK CATEGORY Low Risk Low Risk No Risk     Prior Inpatient Therapy: Yes.   If yes, describein teens  Prior Outpatient Therapy: Yes.      Alcohol Screening: Patient refused Alcohol Screening  Tool: Yes 1. How often do you have a drink containing alcohol?: 4 or more times a week 2. How many drinks containing alcohol do you have on a typical day when you are drinking?: 7, 8, or 9 3. How often do you have six or more drinks on one occasion?: Daily or almost daily AUDIT-C Score: 11 4. How often during the last year have you found that you were not able to stop drinking once you had started?: Daily or almost daily 5.  How often during the last year have you failed to do what was normally expected from you because of drinking?: Weekly 6. How often during the last year have you needed a first drink in the morning to get yourself going after a heavy drinking session?: Daily or almost daily 7. How often during the last year have you had a feeling of guilt of remorse after drinking?: Daily or almost daily 8. How often during the last year have you been unable to remember what happened the night before because you had been drinking?: Monthly 9. Have you or someone else been injured as a result of your drinking?: No 10. Has a relative or friend or a doctor or another health worker been concerned about your drinking or suggested you cut down?: Yes, during the last year Alcohol Use Disorder Identification Test Final Score (AUDIT): 32 Alcohol Brief Interventions/Follow-up: Alcohol education/Brief advice Substance Abuse History in the last 12 months:  Yes.   Consequences of Substance Abuse: Family Consequences:  missing games. Work issues Withdrawal Symptoms:   Nausea Tremors Previous Psychotropic Medications: Yes  Psychological Evaluations: Yes  Past Medical History:  Past Medical History:  Diagnosis Date   Alcohol abuse    Allergy to alpha-gal    Anxiety    Avascular necrosis of bone of right hip (HCC) 02/2023   Bipolar 1 disorder (HCC)    Elevated blood pressure reading with diagnosis of hypertension    Perforated appendicitis 10/09/2018   Perirectal abscess 05/19/2021    Past Surgical History:  Procedure Laterality Date   BACK SURGERY  2008   L4-5 ruptured disc   INCISION AND DRAINAGE PERIRECTAL ABSCESS Left 05/19/2021   LAPAROSCOPIC APPENDECTOMY N/A 10/09/2018   Procedure: APPENDECTOMY LAPAROSCOPIC ATTEMPTED;  Surgeon: Jordis Laneta FALCON, MD;  Location: [facility] ORS;  Service: General;  Laterality: N/A;   PARTIAL COLECTOMY Right 10/09/2018   Procedure: PARTIAL COLECTOMY;  Surgeon: Jordis Laneta FALCON, MD;   Location: [facility] ORS;  Service: General;  Laterality: Right;   TOTAL HIP ARTHROPLASTY Right 04/11/2023   Procedure: TOTAL HIP ARTHROPLASTY;  Surgeon: Edie Norleen PARAS, MD;  Location: [facility] ORS;  Service: Orthopedics;  Laterality: Right;   Family History:  Family History  Adopted: Yes   Family Psychiatric  History: denies Tobacco Screening:  Social History   Tobacco Use  Smoking Status Some Days   Current packs/day: 0.25   Types: Cigarettes  Smokeless Tobacco Never    BH Tobacco Counseling     Are you interested in Tobacco Cessation Medications?  N/A, patient does not use tobacco products Counseled patient on smoking cessation:  N/A, patient does not use tobacco products Reason Tobacco Screening Not Completed: No value filed.       Social History:  Social History   Substance and Sexual Activity  Alcohol Use Yes   Alcohol/week: 12.0 standard drinks of alcohol   Types: 12 Cans of beer per week   Comment: 6 pack daily     Social History  Substance and Sexual Activity  Drug Use Not Currently   Types: Marijuana   Comment: occassional    Additional Social History: Marital status: Long term relationship Long term relationship, how long?: Been with Burnard for 14 years, I am still legally married to my sons mom, but I got up with another girl here recently and Burnard found out so we are separated What types of issues is patient dealing with in the relationship?: She found out about the girl being pregnant Additional relationship information: NA Are you sexually active?: Yes What is your sexual orientation?: Heterosexual Has your sexual activity been affected by drugs, alcohol, medication, or emotional stress?: Yes Does patient have children?: Yes How many children?: 2 How is patient's relationship with their children?: They have been raised well, they love me but I can tell they want more of me                         Allergies:   Allergies  Allergen Reactions    Bovine (Beef) Protein-Containing Drug Products Anaphylaxis   Keflex [Cephalexin] Anaphylaxis   Mobic [Meloxicam] Anaphylaxis   Porcine (Pork) Protein-Containing Drug Products Anaphylaxis   Bc Fast Pain Relief [Aspirin-Salicylamide-Caffeine] Hives   Ciprofloxacin  Hives   Esomeprazole Magnesium  Hives   Lab Results:  Results for orders placed or performed during the hospital encounter of 01/30/24 (from the past 48 hours)  Ethanol     Status: Abnormal   Collection Time: 01/30/24 12:11 PM  Result Value Ref Range   Alcohol, Ethyl (B) 35 (H) <15 mg/dL    Comment: (NOTE) For medical purposes only. Performed at [facility], [address] [place], [place] [address]   cbc     Status: Abnormal   Collection Time: 01/30/24 12:11 PM  Result Value Ref Range   WBC 6.8 4.0 - 10.5 K/uL   RBC 4.33 4.22 - 5.81 MIL/uL   Hemoglobin 14.9 13.0 - 17.0 g/dL   HCT 59.0 60.9 - 47.9 %   MCV 94.5 80.0 - 100.0 fL   MCH 34.4 (H) 26.0 - 34.0 pg   MCHC 36.4 (H) 30.0 - 36.0 g/dL   RDW 85.5 88.4 - 84.4 %   Platelets 194 150 - 400 K/uL   nRBC 0.0 0.0 - 0.2 %    Comment: Performed at [facility], [address] [place], [place] [address]  Comprehensive metabolic panel with GFR     Status: Abnormal   Collection Time: 01/30/24  1:11 PM  Result Value Ref Range   Sodium 136 135 - 145 mmol/L   Potassium 5.8 (H) 3.5 - 5.1 mmol/L    Comment: HEMOLYSIS AT THIS LEVEL MAY AFFECT RESULT POST-ULTRACENTRIFUGATION MU    Chloride 101 98 - 111 mmol/L   CO2 24 22 - 32 mmol/L   Glucose, Bld 110 (H) 70 - 99 mg/dL    Comment: Glucose reference range applies only to samples taken after fasting for at least 8 hours.   BUN 13 6 - 20 mg/dL   Creatinine, Ser 8.88 0.61 - 1.24 mg/dL   Calcium 8.6 (L) 8.9 - 10.3 mg/dL   Total Protein 6.1 (L) 6.5 - 8.1 g/dL   Albumin 3.2 (L) 3.5 - 5.0 g/dL   AST 867 (H) 15 - 41 U/L    Comment: HEMOLYSIS AT THIS LEVEL MAY AFFECT RESULT POST-ULTRACENTRIFUGATION MU     ALT 29 0 - 44 U/L    Comment: HEMOLYSIS AT THIS LEVEL MAY AFFECT  RESULT POST-ULTRACENTRIFUGATION MU    Alkaline Phosphatase 122 38 - 126 U/L    Comment: HEMOLYSIS AT THIS LEVEL MAY AFFECT RESULT POST-ULTRACENTRIFUGATION MU    Total Bilirubin 2.8 (H) 0.0 - 1.2 mg/dL    Comment: HEMOLYSIS AT THIS LEVEL MAY AFFECT RESULT POST-ULTRACENTRIFUGATION MU    GFR, Estimated >60 >60 mL/min    Comment: (NOTE) Calculated using the CKD-EPI Creatinine Equation (2021)    Anion gap 11 5 - 15    Comment: Performed at [facility], [address], [place], [place] [address]  Urine Drug Screen, Qualitative     Status: None   Collection Time: 01/30/24  1:26 PM  Result Value Ref Range   Tricyclic, Ur Screen NONE DETECTED NONE DETECTED   Amphetamines, Ur Screen NONE DETECTED NONE DETECTED   MDMA (Ecstasy)Ur Screen NONE DETECTED NONE DETECTED   Cocaine Metabolite,Ur [place] NONE DETECTED NONE DETECTED   Opiate, Ur Screen NONE DETECTED NONE DETECTED   Phencyclidine (PCP) Ur S NONE DETECTED NONE DETECTED   Cannabinoid 50 Ng, Ur [place] NONE DETECTED NONE DETECTED   Barbiturates, Ur Screen NONE DETECTED NONE DETECTED   Benzodiazepine, Ur Scrn NONE DETECTED NONE DETECTED   Methadone Scn, Ur NONE DETECTED NONE DETECTED    Comment: (NOTE) Tricyclics + metabolites, urine    Cutoff 1000 ng/mL Amphetamines + metabolites, urine  Cutoff 1000 ng/mL MDMA (Ecstasy), urine              Cutoff 500 ng/mL Cocaine Metabolite, urine          Cutoff 300 ng/mL Opiate + metabolites, urine        Cutoff 300 ng/mL Phencyclidine (PCP), urine         Cutoff 25 ng/mL Cannabinoid, urine                 Cutoff 50 ng/mL Barbiturates + metabolites, urine  Cutoff 200 ng/mL Benzodiazepine, urine              Cutoff 200 ng/mL Methadone, urine                   Cutoff 300 ng/mL  The urine drug screen provides only a preliminary, unconfirmed analytical test result and should not be used for non-medical purposes. Clinical  consideration and professional judgment should be applied to any positive drug screen result due to possible interfering substances. A more specific alternate chemical method must be used in order to obtain a confirmed analytical result. Gas chromatography / mass spectrometry (GC/MS) is the preferred confirm atory method. Performed at [facility], [address] [place], [place] [address]     Blood Alcohol level:  Lab Results  Component Value Date   ETH 35 (H) 01/30/2024   ETH 82 (H) 06/14/2023    Metabolic Disorder Labs:  Lab Results  Component Value Date   HGBA1C 4.6 (L) 06/16/2023   MPG 85.32 06/16/2023   MPG 91.06 06/14/2023   No results found for: PROLACTIN Lab Results  Component Value Date   CHOL 232 (H) 06/16/2023   TRIG 235 (H) 06/16/2023   HDL 75 06/16/2023   CHOLHDL 3.1 06/16/2023   VLDL 47 (H) 06/16/2023   LDLCALC 110 (H) 06/16/2023   LDLCALC UNABLE TO CALCULATE IF TRIGLYCERIDE OVER 400 mg/dL 97/78/7974    Current Medications: Current Facility-Administered Medications  Medication Dose Route Frequency Provider Last Rate Last Admin   acetaminophen  (TYLENOL ) tablet 650 mg  650 mg Oral Q6H PRN Smith, Annie B, NP  650 mg at 01/31/24 1448   alum & mag hydroxide-simeth (MAALOX/MYLANTA) 200-200-20 MG/5ML suspension 30 mL  30 mL Oral Q4H PRN Smith, Annie B, NP       chlordiazePOXIDE (LIBRIUM) capsule 25 mg  25 mg Oral TID Leigh Corean Massa, MD   25 mg at 01/31/24 1242   Followed by   NOREEN ON 02/01/2024] chlordiazePOXIDE (LIBRIUM) capsule 10 mg  10 mg Oral TID Leigh Corean Massa, MD       Followed by   NOREEN ON 02/03/2024] chlordiazePOXIDE (LIBRIUM) capsule 5 mg  5 mg Oral TID Leigh Corean Massa, MD       haloperidol  (HALDOL ) tablet 5 mg  5 mg Oral TID PRN Smith, Annie B, NP       And   diphenhydrAMINE  (BENADRYL ) capsule 50 mg  50 mg Oral TID PRN Smith, Annie B, NP       haloperidol  lactate (HALDOL ) injection 5 mg  5 mg Intramuscular TID  PRN Smith, Annie B, NP       And   diphenhydrAMINE  (BENADRYL ) injection 50 mg  50 mg Intramuscular TID PRN Smith, Annie B, NP       And   LORazepam  (ATIVAN ) injection 2 mg  2 mg Intramuscular TID PRN Smith, Annie B, NP       haloperidol  lactate (HALDOL ) injection 10 mg  10 mg Intramuscular TID PRN Smith, Annie B, NP       And   diphenhydrAMINE  (BENADRYL ) injection 50 mg  50 mg Intramuscular TID PRN Smith, Annie B, NP       And   LORazepam  (ATIVAN ) injection 2 mg  2 mg Intramuscular TID PRN Smith, Annie B, NP       EPINEPHrine  (EPI-PEN) injection 0.3 mg  0.3 mg Intramuscular PRN Trudy Carwin, NP       feeding supplement (ENSURE PLUS HIGH PROTEIN) liquid 237 mL  237 mL Oral BID BM McLauchlin, Angela, NP   237 mL at 01/31/24 1439   folic acid (FOLVITE) tablet 1 mg  1 mg Oral Daily Smith, Annie B, NP   1 mg at 01/31/24 9182   gabapentin  (NEURONTIN ) capsule 100 mg  100 mg Oral TID Leigh Corean Massa, MD       LORazepam  (ATIVAN ) tablet 0-4 mg  0-4 mg Oral Q6H Smith, Annie B, NP   1 mg at 01/31/24 1438   Followed by   NOREEN ON 02/01/2024] LORazepam  (ATIVAN ) tablet 0-4 mg  0-4 mg Oral Q12H Smith, Annie B, NP       magnesium  hydroxide (MILK OF MAGNESIA) suspension 30 mL  30 mL Oral Daily PRN Smith, Annie B, NP       melatonin tablet 5 mg  5 mg Oral QHS Kensleigh Gates, Corean Massa, MD       mirtazapine (REMERON) tablet 15 mg  15 mg Oral QHS Aneita Kiger, Corean Massa, MD       multivitamin with minerals tablet 1 tablet  1 tablet Oral Daily Smith, Annie B, NP   1 tablet at 01/31/24 9182   ondansetron  (ZOFRAN ) tablet 4 mg  4 mg Oral Q8H PRN Smith, Annie B, NP   4 mg at 01/30/24 2118   pantoprazole  (PROTONIX ) EC tablet 40 mg  40 mg Oral Daily Leigh Corean Massa, MD   40 mg at 01/31/24 1242   thiamine  (Vitamin B-1) tablet 100 mg  100 mg Oral Daily Smith, Annie B, NP   100 mg at 01/31/24 9182   Or   thiamine  (VITAMIN  B1) injection 100 mg  100 mg Intravenous Daily Smith, Annie B, NP       traZODone  (DESYREL )  tablet 50 mg  50 mg Oral QHS McLauchlin, Angela, NP   50 mg at 01/30/24 2239   PTA Medications: Medications Prior to Admission  Medication Sig Dispense Refill Last Dose/Taking   ARIPiprazole  (ABILIFY ) 5 MG tablet Take 1 tablet (5 mg total) by mouth daily. (Patient not taking: Reported on 01/30/2024) 30 tablet 0    cyclobenzaprine  (FLEXERIL ) 5 MG tablet Take 1 tablet (5 mg total) by mouth 3 (three) times daily as needed. (Patient not taking: Reported on 01/30/2024) 30 tablet 0    DULoxetine  (CYMBALTA ) 20 MG capsule Take 1 capsule (20 mg total) by mouth at bedtime. (Patient not taking: Reported on 01/30/2024) 30 capsule 0    melatonin 5 MG TABS Take 1 tablet (5 mg total) by mouth at bedtime. 30 tablet 0    oxyCODONE  (OXY IR/ROXICODONE ) 5 MG immediate release tablet Take 1-2 tablets (5-10 mg total) by mouth every 4 (four) hours as needed for moderate pain (pain score 4-6). (Patient not taking: Reported on 01/30/2024) 40 tablet 0    topiramate  (TOPAMAX ) 50 MG tablet Take 1 tablet (50 mg total) by mouth daily. (Patient not taking: Reported on 01/30/2024) 30 tablet 0     AIMS:  ,  ,  ,  ,  ,  ,    Musculoskeletal: Strength & Muscle Tone: within normal limits Gait & Station: normal Patient leans: N/A            Psychiatric Specialty Exam:  Presentation  General Appearance:  Disheveled  Eye Contact: Fair  Speech: Normal Rate  Speech Volume: Normal  Handedness: Right   Mood and Affect  Mood: Depressed  Affect: Tearful   Thought Process  Thought Processes: Goal Directed  Duration of Psychotic Symptoms:3-4 months Past Diagnosis of Schizophrenia or Psychoactive disorder: No  Descriptions of Associations:Intact  Orientation:Full (Time, Place and Person)  Thought Content:Logical  Hallucinations:Hallucinations: None  Ideas of Reference:None  Suicidal Thoughts:Suicidal Thoughts: No  Homicidal Thoughts:Homicidal Thoughts: No   Sensorium  Memory: Immediate  Fair; Recent Fair; Remote Fair  Judgment: Fair  Insight: Fair   Art therapist  Concentration: Fair  Attention Span: Fair  Recall: Fair  Fund of Knowledge: Good  Language: Good   Psychomotor Activity  Psychomotor Activity: Psychomotor Activity: Normal   Assets  Assets: Communication Skills; Desire for Improvement; Housing; Social Support; Vocational/Educational   Sleep  Sleep: Sleep: Poor  Estimated Sleeping Duration (Last 24 Hours): 8.00-11.00 hours   Physical Exam: Physical Exam Vitals and nursing note reviewed.  Constitutional:      Appearance: Normal appearance.  HENT:     Head: Normocephalic and atraumatic.  Eyes:     Extraocular Movements: Extraocular movements intact.  Pulmonary:     Effort: Pulmonary effort is normal.  Musculoskeletal:        General: Normal range of motion.     Cervical back: Normal range of motion.  Neurological:     General: No focal deficit present.     Mental Status: He is alert and oriented to person, place, and time.  Psychiatric:        Mood and Affect: Mood is depressed. Affect is tearful.    Review of Systems  Constitutional:  Negative for chills and fever.  Respiratory:  Negative for cough and shortness of breath.   Cardiovascular:  Negative for chest pain.  Gastrointestinal:  Negative for constipation, diarrhea,  nausea and vomiting.  Genitourinary:  Negative for urgency.  Musculoskeletal:  Positive for back pain and joint pain.  Skin:  Negative for rash.  Neurological:  Positive for tremors.  Psychiatric/Behavioral:  Negative for hallucinations and suicidal ideas.    Blood pressure (!) 131/92, pulse 87, temperature 98 F (36.7 C), temperature source Oral, resp. rate 18, height 5' 9 (1.753 m), weight 104.4 kg, SpO2 99%. Body mass index is 33.99 kg/m.  Treatment Plan Summary: Daily contact with patient to assess and evaluate symptoms and progress in treatment and Medication  management  Observation Level/Precautions:  Detox 15 minute checks  Laboratory:  CBC Chemistry Profile HbAIC Vitamin B-12  Psychotherapy:    Medications:    Consultations:    Discharge Concerns:    Estimated LOS:  Other:     Physician Treatment Plan for Primary Diagnosis: Bipolar I disorder, most recent episode depressed (HCC) Long Term Goal(s): Improvement in symptoms so as ready for discharge  Short Term Goals: Ability to identify changes in lifestyle to reduce recurrence of condition will improve, Ability to verbalize feelings will improve, Ability to disclose and discuss suicidal ideas, Ability to demonstrate self-control will improve, Ability to identify and develop effective coping behaviors will improve, Ability to maintain clinical measurements within normal limits will improve, Compliance with prescribed medications will improve, and Ability to identify triggers associated with substance abuse/mental health issues will improve  Physician Treatment Plan for Secondary Diagnosis: Principal Problem:   Bipolar I disorder, most recent episode depressed (HCC) Active Problems:   Arthritis   EtOH dependence (HCC)   Major depressive disorder, recurrent severe without psychotic features (HCC)   Substance induced mood disorder (HCC)   Bipolar I disorder, single manic episode, severe, with psychosis (HCC)   Alcohol use disorder   Tobacco use disorder   Chronic back pain  Long Term Goal(s): Improvement in symptoms so as ready for discharge  Short Term Goals: Ability to identify changes in lifestyle to reduce recurrence of condition will improve, Ability to verbalize feelings will improve, Ability to disclose and discuss suicidal ideas, Ability to demonstrate self-control will improve, Ability to identify and develop effective coping behaviors will improve, Ability to maintain clinical measurements within normal limits will improve, Compliance with prescribed medications will improve, and  Ability to identify triggers associated with substance abuse/mental health issues will improve  I certify that inpatient services furnished can reasonably be expected to improve the patient's condition.    Corean Anette Potters, MD 10/10/20253:50 PM

## 2024-02-01 DIAGNOSIS — R7989 Other specified abnormal findings of blood chemistry: Secondary | ICD-10-CM | POA: Diagnosis present

## 2024-02-01 DIAGNOSIS — E7220 Disorder of urea cycle metabolism, unspecified: Secondary | ICD-10-CM

## 2024-02-01 DIAGNOSIS — E559 Vitamin D deficiency, unspecified: Secondary | ICD-10-CM

## 2024-02-01 LAB — COMPREHENSIVE METABOLIC PANEL WITH GFR
ALT: 33 U/L (ref 0–44)
AST: 71 U/L — ABNORMAL HIGH (ref 15–41)
Albumin: 3.5 g/dL (ref 3.5–5.0)
Alkaline Phosphatase: 150 U/L — ABNORMAL HIGH (ref 38–126)
Anion gap: 18 — ABNORMAL HIGH (ref 5–15)
BUN: 5 mg/dL — ABNORMAL LOW (ref 6–20)
CO2: 19 mmol/L — ABNORMAL LOW (ref 22–32)
Calcium: 9.2 mg/dL (ref 8.9–10.3)
Chloride: 103 mmol/L (ref 98–111)
Creatinine, Ser: 0.78 mg/dL (ref 0.61–1.24)
GFR, Estimated: >60 mL/min (ref >60)
Glucose, Bld: 85 mg/dL (ref 70–99)
Potassium: 3.7 mmol/L (ref 3.5–5.1)
Sodium: 140 mmol/L (ref 135–145)
Total Bilirubin: 0.8 mg/dL (ref 0.0–1.2)
Total Protein: 6.4 g/dL — ABNORMAL LOW (ref 6.5–8.1)

## 2024-02-01 LAB — AMMONIA: Ammonia: 43 umol/L — ABNORMAL HIGH (ref 9–35)

## 2024-02-01 LAB — LIPID PANEL
Cholesterol: 637 mg/dL — ABNORMAL HIGH (ref 0–200)
HDL: 28 mg/dL — ABNORMAL LOW (ref >40)
LDL Cholesterol: UNDETERMINED mg/dL (ref 0–99)
Total CHOL/HDL Ratio: 22.5 ratio
Triglycerides: 1519 mg/dL — ABNORMAL HIGH (ref <150)
VLDL: 304 mg/dL — ABNORMAL HIGH (ref 0–40)

## 2024-02-01 LAB — HEPATITIS PANEL, ACUTE
HCV Ab: NONREACTIVE
Hep A IgM: NONREACTIVE
Hep B C IgM: NONREACTIVE
Hepatitis B Surface Ag: NONREACTIVE

## 2024-02-01 LAB — VITAMIN D 25 HYDROXY (VIT D DEFICIENCY, FRACTURES): Vit D, 25-Hydroxy: 9.62 ng/mL — ABNORMAL LOW (ref 30–100)

## 2024-02-01 LAB — RPR: RPR Ser Ql: NONREACTIVE

## 2024-02-01 LAB — HEMOGLOBIN A1C
Hgb A1c MFr Bld: 5.3 % (ref 4.8–5.6)
Mean Plasma Glucose: 105.41 mg/dL

## 2024-02-01 LAB — MAGNESIUM: Magnesium: 2.1 mg/dL (ref 1.7–2.4)

## 2024-02-01 LAB — TSH: TSH: 2.02 u[IU]/mL (ref 0.350–4.500)

## 2024-02-01 LAB — VITAMIN B12: Vitamin B-12: 221 pg/mL (ref 180–914)

## 2024-02-01 MED ORDER — LACTULOSE 10 GM/15ML PO SOLN
10.0000 g | Freq: Two times a day (BID) | ORAL | Status: DC
  Administered 2024-02-01 – 2024-02-05 (×8): 10 g via ORAL
  Filled 2024-02-01 (×8): qty 15

## 2024-02-01 MED ORDER — VITAMIN D (ERGOCALCIFEROL) 1.25 MG (50000 UNIT) PO CAPS
50000.0000 [IU] | ORAL_CAPSULE | ORAL | Status: DC
  Administered 2024-02-01 – 2024-02-08 (×2): 50000 [IU] via ORAL
  Filled 2024-02-01 (×3): qty 1

## 2024-02-01 NOTE — Plan of Care (Signed)
   Problem: Education: Goal: Knowledge of [facility] Education information/materials will improve Outcome: Progressing Goal: Emotional status will improve Outcome: Progressing Goal: Mental status will improve Outcome: Progressing Goal: Verbalization of understanding the information provided will improve Outcome: Progressing

## 2024-02-01 NOTE — Progress Notes (Addendum)
 Patient denies SI/HI/AVH this morning. Pt continues to complain of alcohol withdrawal symptoms today, agitation, and anxiety, scheduled medications given to treat symptoms per [facility]. Q 15 minute safety checks in place for safety.    02/01/24 0806  Psych Admission Type (Psych Patients Only)  Admission Status Voluntary  Psychosocial Assessment  Patient Complaints Anxiety;Substance abuse;Sleep disturbance  Eye Contact Fair  Facial Expression Anxious;Worried  Affect Anxious  Surveyor, minerals Activity Slow  Appearance/Hygiene Unremarkable  Behavior Characteristics Cooperative;Anxious  Mood Anxious  Thought Process  Coherency WDL  Content WDL  Delusions None reported or observed  Perception WDL  Hallucination None reported or observed  Judgment Impaired  Confusion None  Danger to Self  Current suicidal ideation? Denies  Agreement Not to Harm Self Yes  Description of Agreement Pt verbally contracts for safety  Danger to Others  Danger to Others None reported or observed

## 2024-02-01 NOTE — Progress Notes (Signed)
   02/01/24 2143  Psych Admission Type (Psych Patients Only)  Admission Status Voluntary  Psychosocial Assessment  Patient Complaints Anxiety;Substance abuse;Sleep disturbance  Eye Contact Fair  Facial Expression Animated;Anxious  Affect Appropriate to circumstance;Anxious  Speech Logical/coherent  Interaction Assertive  Motor Activity Other (Comment) (WDL)  Appearance/Hygiene Unremarkable  Behavior Characteristics Appropriate to situation  Mood Anxious;Pleasant  Thought Process  Coherency WDL  Content WDL  Delusions None reported or observed  Perception WDL  Hallucination None reported or observed  Judgment Impaired  Confusion None  Danger to Self  Current suicidal ideation? Denies  Agreement Not to Harm Self Yes  Description of Agreement verbal  Danger to Others  Danger to Others None reported or observed

## 2024-02-01 NOTE — Progress Notes (Signed)
 [facility] MD Progress Note  02/01/2024 3:10 PM Vernon Allen  MRN:  [id number] Subjective:  [name] was seen in his room on rounds. He was in bed and says he is doing alright. He has been anxious, and feels agitated at times. He had night sweats overnight. He is trying to eat better. He denies hallucinations, thoughts of harm to self or others.   Principal Problem: Bipolar I disorder, most recent episode depressed (HCC) Diagnosis: Principal Problem:   Bipolar I disorder, most recent episode depressed (HCC) Active Problems:   Arthritis   EtOH dependence (HCC)   Major depressive disorder, recurrent severe without psychotic features (HCC)   Substance induced mood disorder (HCC)   Bipolar I disorder, single manic episode, severe, with psychosis (HCC)   Alcohol use disorder   Tobacco use disorder   Chronic back pain   Increased ammonia level   Vitamin D deficiency  Total Time spent with patient: 20 minutes  Past Psychiatric History: has been to [facility] and [facility] in the past. Previously tried on topamax  and trazodone . Had Ritalin as a child and did not tolerate it. Remote suicide attempt in teens. No current outpatient provider.   Past Medical History:  Past Medical History:  Diagnosis Date   Alcohol abuse    Allergy to alpha-gal    Anxiety    Avascular necrosis of bone of right hip (HCC) 02/2023   Bipolar 1 disorder (HCC)    Elevated blood pressure reading with diagnosis of hypertension    Perforated appendicitis 10/09/2018   Perirectal abscess 05/19/2021    Past Surgical History:  Procedure Laterality Date   BACK SURGERY  2008   L4-5 ruptured disc   INCISION AND DRAINAGE PERIRECTAL ABSCESS Left 05/19/2021   LAPAROSCOPIC APPENDECTOMY N/A 10/09/2018   Procedure: APPENDECTOMY LAPAROSCOPIC ATTEMPTED;  Surgeon: Jordis Laneta FALCON, MD;  Location: [facility] ORS;  Service: General;  Laterality: N/A;   PARTIAL COLECTOMY Right 10/09/2018   Procedure: PARTIAL COLECTOMY;  Surgeon: Jordis Laneta FALCON, MD;  Location: [facility] ORS;  Service: General;  Laterality: Right;   TOTAL HIP ARTHROPLASTY Right 04/11/2023   Procedure: TOTAL HIP ARTHROPLASTY;  Surgeon: Edie Norleen PARAS, MD;  Location: [facility] ORS;  Service: Orthopedics;  Laterality: Right;   Family History:  Family History  Adopted: Yes   Family Psychiatric  History: denies Social History:  Social History   Substance and Sexual Activity  Alcohol Use Yes   Alcohol/week: 12.0 standard drinks of alcohol   Types: 12 Cans of beer per week   Comment: 6 pack daily     Social History   Substance and Sexual Activity  Drug Use Not Currently   Types: Marijuana   Comment: occassional    Social History   Socioeconomic History   Marital status: Divorced    Spouse name: Not on file   Number of children: 2   Years of education: Not on file   Highest education level: Not on file  Occupational History   Not on file  Tobacco Use   Smoking status: Some Days    Current packs/day: 0.25    Types: Cigarettes   Smokeless tobacco: Never  Vaping Use   Vaping status: Never Used  Substance and Sexual Activity   Alcohol use: Yes    Alcohol/week: 12.0 standard drinks of alcohol    Types: 12 Cans of beer per week    Comment: 6 pack daily   Drug use: Not Currently    Types: Marijuana    Comment: occassional  Sexual activity: Not on file  Other Topics Concern   Not on file  Social History Narrative   Lives with sons and fiance   Social Drivers of Health   Financial Resource Strain: Medium Risk (05/24/2023)   Received from [facility]   Overall Financial Resource Strain (CARDIA)    Difficulty of Paying Living Expenses: Somewhat hard  Food Insecurity: No Food Insecurity (01/30/2024)   Hunger Vital Sign    Worried About Running Out of Food in the Last Year: Never true    Ran Out of Food in the Last Year: Never true  Transportation Needs: No Transportation Needs (01/30/2024)   PRAPARE - Scientist, research (physical sciences) (Medical): No    Lack of Transportation (Non-Medical): No  Physical Activity: Not on file  Stress: Not on file  Social Connections: Not on file   Additional Social History:                         Sleep: Poor Estimated Sleeping Duration (Last 24 Hours): 7.00-8.75 hours  Appetite:  Fair  Current Medications: Current Facility-Administered Medications  Medication Dose Route Frequency Provider Last Rate Last Admin   acetaminophen  (TYLENOL ) tablet 650 mg  650 mg Oral Q6H PRN Smith, Annie B, NP   650 mg at 02/01/24 0547   alum & mag hydroxide-simeth (MAALOX/MYLANTA) 200-200-20 MG/5ML suspension 30 mL  30 mL Oral Q4H PRN Smith, Annie B, NP       chlordiazePOXIDE (LIBRIUM) capsule 10 mg  10 mg Oral TID Leigh Corean Massa, MD       Followed by   NOREEN ON 02/03/2024] chlordiazePOXIDE (LIBRIUM) capsule 5 mg  5 mg Oral TID Leigh Corean Massa, MD       haloperidol  (HALDOL ) tablet 5 mg  5 mg Oral TID PRN Smith, Annie B, NP       And   diphenhydrAMINE  (BENADRYL ) capsule 50 mg  50 mg Oral TID PRN Smith, Annie B, NP       haloperidol  lactate (HALDOL ) injection 5 mg  5 mg Intramuscular TID PRN Smith, Annie B, NP       And   diphenhydrAMINE  (BENADRYL ) injection 50 mg  50 mg Intramuscular TID PRN Smith, Annie B, NP       And   LORazepam  (ATIVAN ) injection 2 mg  2 mg Intramuscular TID PRN Smith, Annie B, NP       haloperidol  lactate (HALDOL ) injection 10 mg  10 mg Intramuscular TID PRN Smith, Annie B, NP       And   diphenhydrAMINE  (BENADRYL ) injection 50 mg  50 mg Intramuscular TID PRN Smith, Annie B, NP       And   LORazepam  (ATIVAN ) injection 2 mg  2 mg Intramuscular TID PRN Smith, Annie B, NP       EPINEPHrine  (EPI-PEN) injection 0.3 mg  0.3 mg Intramuscular PRN Trudy Carwin, NP       feeding supplement (ENSURE PLUS HIGH PROTEIN) liquid 237 mL  237 mL Oral BID BM McLauchlin, Angela, NP   237 mL at 02/01/24 1428   folic acid (FOLVITE) tablet 1 mg  1 mg Oral Daily  Smith, Annie B, NP   1 mg at 02/01/24 0732   gabapentin  (NEURONTIN ) capsule 100 mg  100 mg Oral TID Leigh Corean Massa, MD   100 mg at 02/01/24 1239   lactulose (CHRONULAC) 10 GM/15ML solution 10 g  10 g Oral BID Tabor Bartram,  Corean Massa, MD       LORazepam  (ATIVAN ) tablet 0-4 mg  0-4 mg Oral Q6H Smith, Annie B, NP   1 mg at 02/01/24 1429   Followed by   LORazepam  (ATIVAN ) tablet 0-4 mg  0-4 mg Oral Q12H Smith, Annie B, NP       magnesium  hydroxide (MILK OF MAGNESIA) suspension 30 mL  30 mL Oral Daily PRN Smith, Annie B, NP       melatonin tablet 5 mg  5 mg Oral QHS Caitlin Hillmer, Corean Massa, MD   5 mg at 01/31/24 2030   mirtazapine (REMERON) tablet 15 mg  15 mg Oral QHS Leigh Corean Massa, MD   15 mg at 01/31/24 2029   multivitamin with minerals tablet 1 tablet  1 tablet Oral Daily Smith, Annie B, NP   1 tablet at 02/01/24 0732   ondansetron  (ZOFRAN ) tablet 4 mg  4 mg Oral Q8H PRN Smith, Annie B, NP   4 mg at 01/30/24 2118   pantoprazole  (PROTONIX ) EC tablet 40 mg  40 mg Oral Daily Leigh Corean Massa, MD   40 mg at 02/01/24 0732   thiamine  (Vitamin B-1) tablet 100 mg  100 mg Oral Daily Smith, Annie B, NP   100 mg at 02/01/24 9267   Or   thiamine  (VITAMIN B1) injection 100 mg  100 mg Intravenous Daily Smith, Annie B, NP       traZODone  (DESYREL ) tablet 50 mg  50 mg Oral QHS McLauchlin, Jon, NP   50 mg at 01/31/24 2029   Vitamin D (Ergocalciferol) (DRISDOL) 1.25 MG (50000 UNIT) capsule 50,000 Units  50,000 Units Oral Q7 days Leigh Corean Massa, MD        Lab Results:  Results for orders placed or performed during the hospital encounter of 01/30/24 (from the past 48 hours)  Comprehensive metabolic panel     Status: Abnormal   Collection Time: 02/01/24  6:35 AM  Result Value Ref Range   Sodium 140 135 - 145 mmol/L   Potassium 3.7 3.5 - 5.1 mmol/L   Chloride 103 98 - 111 mmol/L   CO2 19 (L) 22 - 32 mmol/L   Glucose, Bld 85 70 - 99 mg/dL    Comment: Glucose reference range applies  only to samples taken after fasting for at least 8 hours.   BUN 5 (L) 6 - 20 mg/dL   Creatinine, Ser 9.21 0.61 - 1.24 mg/dL   Calcium 9.2 8.9 - 89.6 mg/dL   Total Protein 6.4 (L) 6.5 - 8.1 g/dL   Albumin 3.5 3.5 - 5.0 g/dL   AST 71 (H) 15 - 41 U/L    Comment: HEMOLYSIS AT THIS LEVEL MAY AFFECT RESULT   ALT 33 0 - 44 U/L   Alkaline Phosphatase 150 (H) 38 - 126 U/L   Total Bilirubin 0.8 0.0 - 1.2 mg/dL   GFR, Estimated >39 >39 mL/min    Comment: (NOTE) Calculated using the CKD-EPI Creatinine Equation (2021)    Anion gap 18 (H) 5 - 15    Comment: Performed at [facility], [address], [place], [place] [address]  Magnesium      Status: None   Collection Time: 02/01/24  6:35 AM  Result Value Ref Range   Magnesium  2.1 1.7 - 2.4 mg/dL    Comment: Performed at [facility], [address], [place], [place] [address]  Ammonia     Status: Abnormal   Collection Time: 02/01/24  6:35 AM  Result Value Ref Range   Ammonia 43 (H) 9 - 35 umol/L    Comment: Performed at [facility], [address] [place], [place] [address]  Lipid panel     Status: Abnormal   Collection Time: 02/01/24  6:35 AM  Result Value Ref Range   Cholesterol 637 (H) 0 - 200 mg/dL    Comment:        ATP III CLASSIFICATION:  <200     mg/dL   Desirable  799-760  mg/dL   Borderline High  >=759    mg/dL   High           Triglycerides 1,519 (H) <150 mg/dL   HDL 28 (L) >59 mg/dL   Total CHOL/HDL Ratio 22.5 RATIO   VLDL 304 (H) 0 - 40 mg/dL   LDL Cholesterol UNABLE TO CALCULATE IF TRIGLYCERIDE OVER 400 mg/dL 0 - 99 mg/dL    Comment:        Total Cholesterol/HDL:CHD Risk Coronary Heart Disease Risk Table                     Men   Women  1/2 Average Risk   3.4   3.3  Average Risk       5.0   4.4  2 X Average Risk   9.6   7.1  3 X Average Risk  23.4   11.0        Use the calculated Patient Ratio above and the CHD Risk Table to determine the patient's CHD  Risk.        ATP III CLASSIFICATION (LDL):  <100     mg/dL   Optimal  899-870  mg/dL   Near or Above                    Optimal  130-159  mg/dL   Borderline  839-810  mg/dL   High  >809     mg/dL   Very High Performed at [facility], [address], [place], [place] [address]   Hemoglobin A1c     Status: None   Collection Time: 02/01/24  6:35 AM  Result Value Ref Range   Hgb A1c MFr Bld 5.3 4.8 - 5.6 %    Comment: (NOTE) Diagnosis of Diabetes The following HbA1c ranges recommended by the American Diabetes Association (ADA) may be used as an aid in the diagnosis of diabetes mellitus.  Hemoglobin             Suggested A1C NGSP%              Diagnosis  <5.7                   Non Diabetic  5.7-6.4                Pre-Diabetic  >6.4                   Diabetic  <7.0                   Glycemic control for                       adults with diabetes.     Mean Plasma Glucose 105.41 mg/dL    Comment: Performed at [facility], [address] [place], [place] [address]  TSH     Status: None   Collection Time: 02/01/24  6:35 AM  Result Value Ref Range   TSH 2.020 0.350 - 4.500 uIU/mL    Comment: Performed at [facility], [address], [place], [place] [address]  Hepatitis panel, acute     Status: None   Collection Time: 02/01/24  6:35 AM  Result Value Ref Range   Hepatitis B Surface Ag NON REACTIVE NON REACTIVE   HCV Ab NON REACTIVE NON REACTIVE    Comment: (NOTE) Nonreactive HCV antibody screen is consistent with no HCV infections,  unless recent infection is suspected or other evidence exists to indicate HCV infection.     Hep A IgM NON REACTIVE NON REACTIVE   Hep B C IgM NON REACTIVE NON REACTIVE    Comment: Performed at [facility], [address] [place], [place] [address]  RPR     Status: None   Collection Time: 02/01/24  6:35 AM  Result Value Ref Range   RPR Ser Ql NON REACTIVE NON REACTIVE    Comment:  Performed at [facility], [address] [place], [place] [address]  VITAMIN D 25 Hydroxy (Vit-D Deficiency, Fractures)     Status: Abnormal   Collection Time: 02/01/24  6:35 AM  Result Value Ref Range   Vit D, 25-Hydroxy 9.62 (L) 30 - 100 ng/mL    Comment: (NOTE) Vitamin D deficiency has been defined by the Institute of Medicine  and an Endocrine Society practice guideline as a level of serum 25-OH  vitamin D less than 20 ng/mL (1,2). The Endocrine Society went on to  further define vitamin D insufficiency as a level between 21 and 29  ng/mL (2).  1. IOM (Institute of Medicine). 2010. Dietary reference intakes for  calcium and D. [place]: [employer]. 2. Holick MF, Binkley [place], Bischoff-Ferrari HA, et al. Evaluation,  treatment, and prevention of vitamin D deficiency: an Endocrine  Society clinical practice guideline, JCEM. 2011 Jul; 96(7): 1911-30.  Performed at [facility], [address], [place], [place] [address]   Vitamin B12     Status: None   Collection Time: 02/01/24  6:35 AM  Result Value Ref Range   Vitamin B-12 221 180 - 914 pg/mL    Comment: Performed at [facility], [address] [place], [place] [address]    Blood Alcohol level:  Lab Results  Component Value Date   ETH 35 (H) 01/30/2024   ETH 82 (H) 06/14/2023    Metabolic Disorder Labs: Lab Results  Component Value Date   HGBA1C 5.3 02/01/2024   MPG 105.41 02/01/2024   MPG 85.32 06/16/2023   No results found for: PROLACTIN Lab Results  Component Value Date   CHOL 637 (H) 02/01/2024   TRIG 1,519 (H) 02/01/2024   HDL 28 (L) 02/01/2024   CHOLHDL 22.5 02/01/2024   VLDL 304 (H) 02/01/2024   LDLCALC UNABLE TO CALCULATE IF TRIGLYCERIDE OVER 400 mg/dL 89/88/7974   LDLCALC 889 (H) 06/16/2023    Physical Findings: AIMS:  ,  ,  ,  ,  ,  ,   CIWA:  CIWA-Ar Total: 7 COWS:     Musculoskeletal: Strength & Muscle Tone: within normal limits Gait  & Station: normal Patient leans: N/A  Psychiatric Specialty Exam:  Presentation  General Appearance:  Disheveled  Eye Contact: Fleeting  Speech: Normal Rate  Speech Volume: Normal  Handedness: Right   Mood and Affect  Mood: Anxious  Affect: Congruent   Thought Process  Thought  Processes: Linear  Descriptions of Associations:Intact  Orientation:Full (Time, Place and Person)  Thought Content:Logical  History of Schizophrenia/Schizoaffective disorder:No  Duration of Psychotic Symptoms:Greater than six months  Hallucinations:Hallucinations: None  Ideas of Reference:None  Suicidal Thoughts:Suicidal Thoughts: No  Homicidal Thoughts:Homicidal Thoughts: No   Sensorium  Memory: Immediate Fair; Recent Fair; Remote Fair  Judgment: Fair  Insight: Fair   Art therapist  Concentration: Fair  Attention Span: Fair  Recall: Fair  Fund of Knowledge: Good  Language: Good   Psychomotor Activity  Psychomotor Activity: Psychomotor Activity: Decreased   Assets  Assets: Communication Skills; Desire for Improvement; Housing; Social Support; Vocational/Educational   Sleep  Sleep: Sleep: Poor    Physical Exam: Physical Exam Vitals and nursing note reviewed.  Constitutional:      Appearance: Normal appearance.  HENT:     Head: Normocephalic and atraumatic.  Eyes:     Extraocular Movements: Extraocular movements intact.  Pulmonary:     Effort: Pulmonary effort is normal.  Musculoskeletal:        General: Normal range of motion.     Cervical back: Normal range of motion.  Neurological:     General: No focal deficit present.     Mental Status: He is oriented to person, place, and time.  Psychiatric:        Mood and Affect: Mood is anxious.    Review of Systems  Constitutional:  Positive for chills and diaphoresis.  Gastrointestinal:  Negative for constipation, diarrhea, nausea and vomiting.  Genitourinary:  Negative for  dysuria.  Neurological:  Positive for tremors.  Psychiatric/Behavioral:  The patient is nervous/anxious and has insomnia.    Blood pressure 131/88, pulse (!) 101, temperature 98 F (36.7 C), temperature source Oral, resp. rate 18, height 5' 9 (1.753 m), weight 104.4 kg, SpO2 97%. Body mass index is 33.99 kg/m.   Treatment Plan Summary: Long Term Goals: Improvement in symptoms so as ready for discharge   Short Term Goals: Patient will verbalize feelings in meetings with treatment team members., Patient will attend at least of 50% of the groups daily., Pt will complete the PHQ9 on admission, day 3 and discharge., and Patient will take medications as prescribed daily.   Medications: Mood/anxiety: continue group therapy, milieu therapy, 1:1 evaluation with provider.  Medication management: started remeron 15mg  PO at bedtime for mood, anxiety and sleep. Also added melatonin 5mg  PO at bedtime Gabapentin  100mg  PO TID for addiction, as well as chronic pain and anxiety  Substance Abuse:  brief intervention provided abstinence advised.  Severe alcohol use disorder: replacing thiamine . Encourage adequate PO intake. May require lab monitoring of electrolytes. Benzodiazepine taper as tolerated. Seizure precautions. Monitor for delirium tremens.  Nicotine and tobacco use: Nicotine replacement therapy provided.  Medical: PRNs for pain, constipation, indigestion available.  Ammonia elevated. Starting lactulose 10mL PO BID LFT trending as expected. Lipids are abnormal, will need follow up with primary care after discharge.  Vitamin D low, replacing Safety and Monitoring: voluntarily admission to [facility] inpatient unit unit for safety, stabilization and treatment Daily contact with patient to assess and evaluate symptoms and progress in treatment Patient's case to be discussed in multi-disciplinary team meeting Observation Level : q15 minute checks Vital signs: q12  hours Precautions: withdrawal   Based on my evaluation the patient does not appear to have an emergency medical condition.    I certify that inpatient services furnished can reasonably be expected to improve the patient's condition.   Corean Anette Potters, MD 02/01/2024,  3:10 PM

## 2024-02-01 NOTE — Progress Notes (Signed)
(  Sleep Hours) - 11.75 (Any PRNs that were needed, meds refused, or side effects to meds)- No PRN meds given, 0230 dose of ativan  not given due to pt being asleep and not complaining of withdrawal symptoms.  (Any disturbances and when (visitation, over night)- None  (Concerns raised by the patient)- None  (SI/HI/AVH)- Denies SI/HI/AVH

## 2024-02-01 NOTE — Progress Notes (Signed)
 Patient approached Clinical research associate stating the he feels terrible and is so anxious and doesn't know what to do. Patient reports I feel awful. This is why I have thoughts of not wanting to live no more. Patient is able to verbally contract for safety while on the unit. Writer provided verbal support and listened to patient's concerns. Writer administered patient's prescribed meds and provided encouragement to patient. Pt reports he is looking forward to his son visiting tonight.

## 2024-02-01 NOTE — Plan of Care (Signed)
   Problem: Education: Goal: Emotional status will improve Outcome: Progressing Goal: Mental status will improve Outcome: Progressing Goal: Verbalization of understanding the information provided will improve Outcome: Progressing   Problem: Activity: Goal: Interest or engagement in activities will improve Outcome: Progressing

## 2024-02-01 NOTE — Group Note (Signed)
 Date:  02/01/2024 Time:  3:40 PM  Group Topic/Focus: Responsibility- Discussing the importance of taking personal responsibility  Self Care:   The focus of this group is to help patients understand the importance of self-care in order to improve or restore emotional, physical, spiritual, interpersonal, and financial health.    Participation Level:  Did Not Attend  Shanda JONETTA Challenger 02/01/2024, 3:40 PM

## 2024-02-02 LAB — HIV-1 RNA QUANT-NO REFLEX-BLD
HIV 1 RNA Quant: <20 {copies}/mL
LOG10 HIV-1 RNA: UNDETERMINED {Log_copies}/mL

## 2024-02-02 MED ORDER — QUETIAPINE FUMARATE 25 MG PO TABS
25.0000 mg | ORAL_TABLET | Freq: Three times a day (TID) | ORAL | Status: DC
Start: 2024-02-02 — End: 2024-02-03
  Administered 2024-02-02 – 2024-02-03 (×3): 25 mg via ORAL
  Filled 2024-02-02 (×3): qty 1

## 2024-02-02 MED ORDER — MIRTAZAPINE 30 MG PO TABS
30.0000 mg | ORAL_TABLET | Freq: Every day | ORAL | Status: DC
  Administered 2024-02-02: 30 mg via ORAL
  Filled 2024-02-02: qty 1

## 2024-02-02 NOTE — Plan of Care (Signed)
   Problem: Education: Goal: Verbalization of understanding the information provided will improve Outcome: Progressing   Problem: Activity: Goal: Interest or engagement in activities will improve Outcome: Progressing

## 2024-02-02 NOTE — Group Note (Signed)
 Date:  02/02/2024 Time:  5:59 AM  Group Topic/Focus:  Wrap-Up Group:   The focus of this group is to help patients review their daily goal of treatment and discuss progress on daily workbooks.    Participation Level:  None  Participation Quality:  Resistant  Affect:  Resistant  Cognitive:  Lacking  Insight: Lacking  Engagement in Group:  Lacking  Modes of Intervention:  Discussion  Additional Comments:  Patient attended group but did not participate   Vernon Allen 02/02/2024, 5:59 AM

## 2024-02-02 NOTE — Plan of Care (Signed)
  Problem: Education: Goal: Emotional status will improve Outcome: Progressing Goal: Verbalization of understanding the information provided will improve Outcome: Progressing   Problem: Activity: Goal: Interest or engagement in activities will improve Outcome: Progressing   

## 2024-02-02 NOTE — Group Note (Unsigned)
 Date:  02/02/2024 Time:  8:51 PM  Group Topic/Focus:  Wrap-Up Group:   The focus of this group is to help patients review their daily goal of treatment and discuss progress on daily workbooks.     Participation Level:  {BHH PARTICIPATION OZCZO:77735}  Participation Quality:  {BHH PARTICIPATION QUALITY:22265}  Affect:  {BHH AFFECT:22266}  Cognitive:  {BHH COGNITIVE:22267}  Insight: {BHH Insight2:20797}  Engagement in Group:  {BHH ENGAGEMENT IN HMNLE:77731}  Modes of Intervention:  {BHH MODES OF INTERVENTION:22269}  Additional Comments:  ***  [name] 02/02/2024, 8:51 PM

## 2024-02-02 NOTE — BHH Group Notes (Signed)
 Adult Psychoeducational Group Note   Date:  02/02/2024 Time:  10:19 AM   Group Topic/Focus:  Activity:  Thumball activities for groups involve tossing a ball with questions or prompts, where the person who catches it responds to the panel under their thumb. This can be used for various purposes, such as icebreakers, team-building, and even physical warm-ups. Specific activities include collaborative exercises, guided discussions, and even movement-based games.    Participation Level:  Did Not Attend   Additional Comments:  Pt was invited but did not attend group for thumbball activity.    Vernon Allen 02/02/2024, 10:19 AM

## 2024-02-02 NOTE — BHH Group Notes (Signed)
 Adult Psychoeducational Group Note  Date:  02/02/2024 Time:  10:13 AM  Group Topic/Focus:  Goals Group:   The focus of this group is to help patients establish daily goals to achieve during treatment and discuss how the patient can incorporate goal setting into their daily lives to aide in recovery.  Participation Level:  Did Not Attend  Additional Comments:  Pt was invited but did not attend orientation/goals group.  Vernon Allen CHRISTELLA Nightingale 02/02/2024, 10:13 AM

## 2024-02-02 NOTE — Progress Notes (Signed)
   02/02/24 0800  Psych Admission Type (Psych Patients Only)  Admission Status Voluntary  Psychosocial Assessment  Patient Complaints Anxiety;Substance abuse  Eye Contact Fair  Facial Expression Animated;Anxious  Affect Anxious  Speech Logical/coherent  Interaction Assertive  Motor Activity Other (Comment) (WNL)  Appearance/Hygiene Unremarkable  Behavior Characteristics Cooperative;Anxious  Mood Pleasant;Anxious  Thought Process  Coherency WDL  Content WDL  Delusions None reported or observed  Perception WDL  Hallucination None reported or observed  Judgment Impaired  Confusion None  Danger to Self  Current suicidal ideation? Denies  Agreement Not to Harm Self Yes  Description of Agreement verbal  Danger to Others  Danger to Others None reported or observed

## 2024-02-02 NOTE — Progress Notes (Signed)
   02/02/24 2215  Psych Admission Type (Psych Patients Only)  Admission Status Voluntary  Psychosocial Assessment  Patient Complaints Anxiety  Eye Contact Fair  Facial Expression Animated  Affect Appropriate to circumstance  Speech Logical/coherent  Interaction Assertive  Motor Activity Other (Comment) (WDL)  Appearance/Hygiene Unremarkable  Behavior Characteristics Appropriate to situation  Mood Pleasant  Thought Process  Coherency WDL  Content WDL  Delusions None reported or observed  Perception WDL  Hallucination None reported or observed  Judgment Impaired  Confusion None  Danger to Self  Current suicidal ideation? Denies  Agreement Not to Harm Self Yes  Description of Agreement verbal  Danger to Others  Danger to Others None reported or observed

## 2024-02-02 NOTE — Progress Notes (Signed)
 [facility] MD Progress Note  02/02/2024 3:40 PM Vernon Allen  MRN:  [id number] Subjective:  Vernon Allen was seen in his room on rounds. He feels he is sleeping fair, with broken pattern. He is eating okay. He is still have shaking, but less than previous. Right hip pain, ongoing. He is not having hallucinations, paranoia or thoughts of harm to self or others. He feels that his biggest issue at this time is anxiety and depression, and would like medication added for this.   Principal Problem: Bipolar I disorder, most recent episode depressed (HCC) Diagnosis: Principal Problem:   Bipolar I disorder, most recent episode depressed (HCC) Active Problems:   Arthritis   EtOH dependence (HCC)   Major depressive disorder, recurrent severe without psychotic features (HCC)   Substance induced mood disorder (HCC)   Bipolar I disorder, single manic episode, severe, with psychosis (HCC)   Alcohol use disorder   Tobacco use disorder   Chronic back pain   Increased ammonia level   Vitamin D deficiency  Total Time spent with patient: 20 minutes  Past Psychiatric History: has been to [facility] and [facility] in the past. Previously tried on topamax  and trazodone . Had Ritalin as a child and did not tolerate it. Remote suicide attempt in teens. No current outpatient provider.   Past Medical History:  Past Medical History:  Diagnosis Date   Alcohol abuse    Allergy to alpha-gal    Anxiety    Avascular necrosis of bone of right hip (HCC) 02/2023   Bipolar 1 disorder (HCC)    Elevated blood pressure reading with diagnosis of hypertension    Perforated appendicitis 10/09/2018   Perirectal abscess 05/19/2021    Past Surgical History:  Procedure Laterality Date   BACK SURGERY  2008   L4-5 ruptured disc   INCISION AND DRAINAGE PERIRECTAL ABSCESS Left 05/19/2021   LAPAROSCOPIC APPENDECTOMY N/A 10/09/2018   Procedure: APPENDECTOMY LAPAROSCOPIC ATTEMPTED;  Surgeon: Jordis Laneta FALCON, MD;  Location: [facility] ORS;  Service:  General;  Laterality: N/A;   PARTIAL COLECTOMY Right 10/09/2018   Procedure: PARTIAL COLECTOMY;  Surgeon: Jordis Laneta FALCON, MD;  Location: [facility] ORS;  Service: General;  Laterality: Right;   TOTAL HIP ARTHROPLASTY Right 04/11/2023   Procedure: TOTAL HIP ARTHROPLASTY;  Surgeon: Edie Norleen PARAS, MD;  Location: [facility] ORS;  Service: Orthopedics;  Laterality: Right;   Family History:  Family History  Adopted: Yes   Family Psychiatric  History: see H&P Social History:  Social History   Substance and Sexual Activity  Alcohol Use Yes   Alcohol/week: 12.0 standard drinks of alcohol   Types: 12 Cans of beer per week   Comment: 6 pack daily     Social History   Substance and Sexual Activity  Drug Use Not Currently   Types: Marijuana   Comment: occassional    Social History   Socioeconomic History   Marital status: Divorced    Spouse name: Not on file   Number of children: 2   Years of education: Not on file   Highest education level: Not on file  Occupational History   Not on file  Tobacco Use   Smoking status: Some Days    Current packs/day: 0.25    Types: Cigarettes   Smokeless tobacco: Never  Vaping Use   Vaping status: Never Used  Substance and Sexual Activity   Alcohol use: Yes    Alcohol/week: 12.0 standard drinks of alcohol    Types: 12 Cans of beer per week  Comment: 6 pack daily   Drug use: Not Currently    Types: Marijuana    Comment: occassional   Sexual activity: Not on file  Other Topics Concern   Not on file  Social History Narrative   Lives with sons and fiance   Social Drivers of Health   Financial Resource Strain: Medium Risk (05/24/2023)   Received from [facility]   Overall Financial Resource Strain (CARDIA)    Difficulty of Paying Living Expenses: Somewhat hard  Food Insecurity: No Food Insecurity (01/30/2024)   Hunger Vital Sign    Worried About Running Out of Food in the Last Year: Never true    Ran Out of Food in the Last  Year: Never true  Transportation Needs: No Transportation Needs (01/30/2024)   PRAPARE - Administrator, Civil Service (Medical): No    Lack of Transportation (Non-Medical): No  Physical Activity: Not on file  Stress: Not on file  Social Connections: Not on file   Additional Social History:                         Sleep: Fair Estimated Sleeping Duration (Last 24 Hours): 5.75-6.75 hours  Appetite:  Good  Current Medications: Current Facility-Administered Medications  Medication Dose Route Frequency Provider Last Rate Last Admin   acetaminophen  (TYLENOL ) tablet 650 mg  650 mg Oral Q6H PRN Smith, Annie B, NP   650 mg at 02/01/24 1645   alum & mag hydroxide-simeth (MAALOX/MYLANTA) 200-200-20 MG/5ML suspension 30 mL  30 mL Oral Q4H PRN Smith, Annie B, NP       chlordiazePOXIDE (LIBRIUM) capsule 10 mg  10 mg Oral TID Leigh Corean Massa, MD   10 mg at 02/02/24 1143   Followed by   NOREEN ON 02/03/2024] chlordiazePOXIDE (LIBRIUM) capsule 5 mg  5 mg Oral TID Leigh Corean Massa, MD       haloperidol  (HALDOL ) tablet 5 mg  5 mg Oral TID PRN Smith, Annie B, NP       And   diphenhydrAMINE  (BENADRYL ) capsule 50 mg  50 mg Oral TID PRN Smith, Annie B, NP       haloperidol  lactate (HALDOL ) injection 5 mg  5 mg Intramuscular TID PRN Smith, Annie B, NP       And   diphenhydrAMINE  (BENADRYL ) injection 50 mg  50 mg Intramuscular TID PRN Smith, Annie B, NP       And   LORazepam  (ATIVAN ) injection 2 mg  2 mg Intramuscular TID PRN Smith, Annie B, NP       haloperidol  lactate (HALDOL ) injection 10 mg  10 mg Intramuscular TID PRN Smith, Annie B, NP       And   diphenhydrAMINE  (BENADRYL ) injection 50 mg  50 mg Intramuscular TID PRN Smith, Annie B, NP       And   LORazepam  (ATIVAN ) injection 2 mg  2 mg Intramuscular TID PRN Smith, Annie B, NP       EPINEPHrine  (EPI-PEN) injection 0.3 mg  0.3 mg Intramuscular PRN Trudy Carwin, NP       feeding supplement (ENSURE PLUS HIGH  PROTEIN) liquid 237 mL  237 mL Oral BID BM McLauchlin, Angela, NP   237 mL at 02/02/24 1142   folic acid (FOLVITE) tablet 1 mg  1 mg Oral Daily Smith, Annie B, NP   1 mg at 02/02/24 0749   gabapentin  (NEURONTIN ) capsule 100 mg  100 mg Oral TID Kalel Harty,  Corean Massa, MD   100 mg at 02/02/24 1143   lactulose (CHRONULAC) 10 GM/15ML solution 10 g  10 g Oral BID Leigh Corean Massa, MD   10 g at 02/02/24 9250   LORazepam  (ATIVAN ) tablet 0-4 mg  0-4 mg Oral Q12H Smith, Annie B, NP   1 mg at 02/02/24 9251   magnesium  hydroxide (MILK OF MAGNESIA) suspension 30 mL  30 mL Oral Daily PRN Smith, Annie B, NP       melatonin tablet 5 mg  5 mg Oral QHS Danely Bayliss, Corean Massa, MD   5 mg at 02/01/24 2105   mirtazapine (REMERON) tablet 15 mg  15 mg Oral QHS Takyla Kuchera, Corean Massa, MD   15 mg at 02/01/24 2106   multivitamin with minerals tablet 1 tablet  1 tablet Oral Daily Smith, Annie B, NP   1 tablet at 02/02/24 0749   ondansetron  (ZOFRAN ) tablet 4 mg  4 mg Oral Q8H PRN Smith, Annie B, NP   4 mg at 01/30/24 2118   pantoprazole  (PROTONIX ) EC tablet 40 mg  40 mg Oral Daily Leigh Corean Massa, MD   40 mg at 02/02/24 0749   QUEtiapine (SEROQUEL) tablet 25 mg  25 mg Oral TID Leigh Corean Massa, MD       thiamine  (Vitamin B-1) tablet 100 mg  100 mg Oral Daily Smith, Annie B, NP   100 mg at 02/02/24 9250   Or   thiamine  (VITAMIN B1) injection 100 mg  100 mg Intravenous Daily Smith, Annie B, NP       traZODone  (DESYREL ) tablet 50 mg  50 mg Oral QHS McLauchlin, Jon, NP   50 mg at 02/01/24 2105   Vitamin D (Ergocalciferol) (DRISDOL) 1.25 MG (50000 UNIT) capsule 50,000 Units  50,000 Units Oral Q7 days Leigh Corean Massa, MD   50,000 Units at 02/01/24 1646    Lab Results:  Results for orders placed or performed during the hospital encounter of 01/30/24 (from the past 48 hours)  Comprehensive metabolic panel     Status: Abnormal   Collection Time: 02/01/24  6:35 AM  Result Value Ref Range   Sodium 140 135 -  145 mmol/L   Potassium 3.7 3.5 - 5.1 mmol/L   Chloride 103 98 - 111 mmol/L   CO2 19 (L) 22 - 32 mmol/L   Glucose, Bld 85 70 - 99 mg/dL    Comment: Glucose reference range applies only to samples taken after fasting for at least 8 hours.   BUN 5 (L) 6 - 20 mg/dL   Creatinine, Ser 9.21 0.61 - 1.24 mg/dL   Calcium 9.2 8.9 - 89.6 mg/dL   Total Protein 6.4 (L) 6.5 - 8.1 g/dL   Albumin 3.5 3.5 - 5.0 g/dL   AST 71 (H) 15 - 41 U/L    Comment: HEMOLYSIS AT THIS LEVEL MAY AFFECT RESULT   ALT 33 0 - 44 U/L   Alkaline Phosphatase 150 (H) 38 - 126 U/L   Total Bilirubin 0.8 0.0 - 1.2 mg/dL   GFR, Estimated >39 >39 mL/min    Comment: (NOTE) Calculated using the CKD-EPI Creatinine Equation (2021)    Anion gap 18 (H) 5 - 15    Comment: Performed at [facility], [address], [place], [place] [address]  Magnesium      Status: None   Collection Time: 02/01/24  6:35 AM  Result Value Ref Range   Magnesium  2.1 1.7 - 2.4 mg/dL    Comment: Performed at [facility], [address], [place], [place] [address]  Ammonia     Status: Abnormal   Collection Time: 02/01/24  6:35 AM  Result Value Ref Range   Ammonia 43 (H) 9 - 35 umol/L    Comment: Performed at [facility], [address] [place], [place] [address]  Lipid panel     Status: Abnormal   Collection Time: 02/01/24  6:35 AM  Result Value Ref Range   Cholesterol 637 (H) 0 - 200 mg/dL    Comment:        ATP III CLASSIFICATION:  <200     mg/dL   Desirable  799-760  mg/dL   Borderline High  >=759    mg/dL   High           Triglycerides 1,519 (H) <150 mg/dL   HDL 28 (L) >59 mg/dL   Total CHOL/HDL Ratio 22.5 RATIO   VLDL 304 (H) 0 - 40 mg/dL   LDL Cholesterol UNABLE TO CALCULATE IF TRIGLYCERIDE OVER 400 mg/dL 0 - 99 mg/dL    Comment:        Total Cholesterol/HDL:CHD Risk Coronary Heart Disease Risk Table                     Men   Women  1/2 Average Risk   3.4   3.3  Average Risk        5.0   4.4  2 X Average Risk   9.6   7.1  3 X Average Risk  23.4   11.0        Use the calculated Patient Ratio above and the CHD Risk Table to determine the patient's CHD Risk.        ATP III CLASSIFICATION (LDL):  <100     mg/dL   Optimal  899-870  mg/dL   Near or Above                    Optimal  130-159  mg/dL   Borderline  839-810  mg/dL   High  >809     mg/dL   Very High Performed at [facility], [address] [place], [place] [address]   Hemoglobin A1c     Status: None   Collection Time: 02/01/24  6:35 AM  Result Value Ref Range   Hgb A1c MFr Bld 5.3 4.8 - 5.6 %    Comment: (NOTE) Diagnosis of Diabetes The following HbA1c ranges recommended by the American Diabetes Association (ADA) may be used as an aid in the diagnosis of diabetes mellitus.  Hemoglobin             Suggested A1C NGSP%              Diagnosis  <5.7                   Non Diabetic  5.7-6.4                Pre-Diabetic  >6.4                   Diabetic  <7.0                   Glycemic control for                       adults with diabetes.     Mean Plasma Glucose 105.41 mg/dL  Comment: Performed at [facility], [address] [place], [place] [address]  TSH     Status: None   Collection Time: 02/01/24  6:35 AM  Result Value Ref Range   TSH 2.020 0.350 - 4.500 uIU/mL    Comment: Performed at [facility], [address], [place], [place] [address]  Hepatitis panel, acute     Status: None   Collection Time: 02/01/24  6:35 AM  Result Value Ref Range   Hepatitis B Surface Ag NON REACTIVE NON REACTIVE   HCV Ab NON REACTIVE NON REACTIVE    Comment: (NOTE) Nonreactive HCV antibody screen is consistent with no HCV infections,  unless recent infection is suspected or other evidence exists to indicate HCV infection.     Hep A IgM NON REACTIVE NON REACTIVE   Hep B C IgM NON REACTIVE NON REACTIVE    Comment: Performed at [facility],  [address], [place], [place] [address]  HIV-1 RNA quant-no reflex-bld     Status: None   Collection Time: 02/01/24  6:35 AM  Result Value Ref Range   HIV 1 RNA Quant <20 copies/mL    Comment: (NOTE) HIV-1 RNA not detected The reportable range for this assay is 20 to 10,000,000 copies HIV-1 RNA/mL. Performed At: [facility] [address] [place], [place] [id number] Jennette Shorter MD Ey:[id number]    LOG10 HIV-1 RNA UNABLE TO CALCULATE log10copy/mL    Comment: (NOTE) Unable to calculate result since non-numeric result obtained for component test.   RPR     Status: None   Collection Time: 02/01/24  6:35 AM  Result Value Ref Range   RPR Ser Ql NON REACTIVE NON REACTIVE    Comment: Performed at [facility], [address] [place], [place] [address]  VITAMIN D 25 Hydroxy (Vit-D Deficiency, Fractures)     Status: Abnormal   Collection Time: 02/01/24  6:35 AM  Result Value Ref Range   Vit D, 25-Hydroxy 9.62 (L) 30 - 100 ng/mL    Comment: (NOTE) Vitamin D deficiency has been defined by the Institute of Medicine  and an Endocrine Society practice guideline as a level of serum 25-OH  vitamin D less than 20 ng/mL (1,2). The Endocrine Society went on to  further define vitamin D insufficiency as a level between 21 and 29  ng/mL (2).  1. IOM (Institute of Medicine). 2010. Dietary reference intakes for  calcium and D. [place]: [employer]. 2. Holick MF, Binkley [place], Bischoff-Ferrari HA, et al. Evaluation,  treatment, and prevention of vitamin D deficiency: an Endocrine  Society clinical practice guideline, JCEM. 2011 Jul; 96(7): 1911-30.  Performed at [facility], [address], [place], [place] [address]   Vitamin B12     Status: None   Collection Time: 02/01/24  6:35 AM  Result Value Ref Range   Vitamin B-12 221 180 - 914 pg/mL    Comment: Performed at [facility], [address], [place], [place] [address]     Blood Alcohol level:  Lab Results  Component Value Date   ETH 35 (H) 01/30/2024   ETH 82 (H) 06/14/2023    Metabolic Disorder Labs: Lab Results  Component Value Date   HGBA1C 5.3 02/01/2024   MPG 105.41 02/01/2024   MPG 85.32 06/16/2023   No results found for: PROLACTIN Lab Results  Component Value Date   CHOL 637 (H) 02/01/2024   TRIG 1,519 (H)  02/01/2024   HDL 28 (L) 02/01/2024   CHOLHDL 22.5 02/01/2024   VLDL 304 (H) 02/01/2024   LDLCALC UNABLE TO CALCULATE IF TRIGLYCERIDE OVER 400 mg/dL 89/88/7974   LDLCALC 889 (H) 06/16/2023    Physical Findings: AIMS:  ,  ,  ,  ,  ,  ,   CIWA:  CIWA-Ar Total: 2 COWS:     Musculoskeletal: Strength & Muscle Tone: within normal limits Gait & Station: normal Patient leans: N/A  Psychiatric Specialty Exam:  Presentation  General Appearance:  Casual  Eye Contact: Good  Speech: Normal Rate  Speech Volume: Normal  Handedness: Right   Mood and Affect  Mood: Depressed; Anxious  Affect: Congruent   Thought Process  Thought Processes: Goal Directed  Descriptions of Associations:Intact  Orientation:Full (Time, Place and Person)  Thought Content:Logical  History of Schizophrenia/Schizoaffective disorder:No  Duration of Psychotic Symptoms:Greater than six months  Hallucinations:Hallucinations: None  Ideas of Reference:None  Suicidal Thoughts:Suicidal Thoughts: No  Homicidal Thoughts:Homicidal Thoughts: No   Sensorium  Memory: Immediate Good; Recent Good; Remote Fair  Judgment: Fair  Insight: Fair   Art therapist  Concentration: Fair  Attention Span: Fair  Recall: Good  Fund of Knowledge: Good  Language: Good   Psychomotor Activity  Psychomotor Activity: Psychomotor Activity: Decreased   Assets  Assets: Communication Skills; Desire for Improvement; Leisure Time   Sleep  Sleep: Sleep: Good    Physical Exam: Physical Exam Vitals and nursing note  reviewed.  Constitutional:      Appearance: Normal appearance.  HENT:     Head: Normocephalic and atraumatic.  Eyes:     Extraocular Movements: Extraocular movements intact.  Pulmonary:     Effort: Pulmonary effort is normal.  Musculoskeletal:        General: Normal range of motion.     Cervical back: Normal range of motion.  Neurological:     General: No focal deficit present.     Mental Status: He is alert and oriented to person, place, and time.  Psychiatric:        Mood and Affect: Mood is anxious and depressed.        Behavior: Behavior is cooperative.    Review of Systems  Constitutional:  Negative for chills and fever.  Respiratory:  Negative for cough and shortness of breath.   Cardiovascular:  Negative for chest pain.  Gastrointestinal:  Negative for constipation, diarrhea, nausea and vomiting.  Genitourinary:  Negative for dysuria.  Musculoskeletal:  Positive for joint pain and myalgias.  Skin:  Negative for rash.  Neurological:  Positive for tremors.  Psychiatric/Behavioral:  Negative for hallucinations and suicidal ideas.    Blood pressure 135/87, pulse 75, temperature (!) 97.5 F (36.4 C), temperature source Oral, resp. rate 20, height 5' 9 (1.753 m), weight 104.4 kg, SpO2 98%. Body mass index is 33.99 kg/m.   Treatment Plan Summary: Long Term Goals: Improvement in symptoms so as ready for discharge   Short Term Goals: Patient will verbalize feelings in meetings with treatment team members., Patient will attend at least of 50% of the groups daily., Pt will complete the PHQ9 on admission, day 3 and discharge., and Patient will take medications as prescribed daily.   Medications: Mood/anxiety: continue group therapy, milieu therapy, 1:1 evaluation with provider.  Medication management: increased remeron 30 mg PO at bedtime for mood, anxiety and sleep. Also added melatonin 5mg  PO at bedtime Gabapentin  100mg  PO TID for addiction, as well as chronic pain and  anxiety  Added seroquel  25mg  PO TID for mood and anxiety.  Substance Abuse:  brief intervention provided abstinence advised.  Severe alcohol use disorder: replacing thiamine . Encourage adequate PO intake. May require lab monitoring of electrolytes. Benzodiazepine taper as tolerated. Seizure precautions. Monitor for delirium tremens.  Nicotine and tobacco use: Nicotine replacement therapy provided.  Medical: PRNs for pain, constipation, indigestion available.  Ammonia elevated. Started lactulose 10mL PO BID LFT trending as expected. Lipids are abnormal, will need follow up with primary care after discharge.  Vitamin D low, replacing Safety and Monitoring: voluntarily admission to [facility] inpatient unit unit for safety, stabilization and treatment Daily contact with patient to assess and evaluate symptoms and progress in treatment Patient's case to be discussed in multi-disciplinary team meeting Observation Level : q15 minute checks Vital signs: q12 hours Precautions: withdrawal   Based on my evaluation the patient does not appear to have an emergency medical condition.    I certify that inpatient services furnished can reasonably be expected to improve the patient's condition.   Corean Anette Potters, MD 02/02/2024, 3:40 PM

## 2024-02-02 NOTE — Progress Notes (Signed)
(  Sleep Hours) - 8.5 hours (Any PRNs that were needed, meds refused, or side effects to meds)-  None (Any disturbances and when (visitation, over night)- None (Concerns raised by the patient)-  Continues to report anxiety (SI/HI/AVH)-  Denies

## 2024-02-02 NOTE — Plan of Care (Signed)
   Problem: Education: Goal: Verbalization of understanding the information provided will improve Outcome: Progressing   Problem: Activity: Goal: Sleeping patterns will improve Outcome: Progressing

## 2024-02-02 NOTE — Group Note (Unsigned)
 Date:  02/02/2024 Time:  8:31 PM  Group Topic/Focus:  Wrap-Up Group:   The focus of this group is to help patients review their daily goal of treatment and discuss progress on daily workbooks.     Participation Level:  {BHH PARTICIPATION OZCZO:77735}  Participation Quality:  {BHH PARTICIPATION QUALITY:22265}  Affect:  {BHH AFFECT:22266}  Cognitive:  {BHH COGNITIVE:22267}  Insight: {BHH Insight2:20797}  Engagement in Group:  {BHH ENGAGEMENT IN HMNLE:77731}  Modes of Intervention:  {BHH MODES OF INTERVENTION:22269}  Additional Comments:  ***  [name] 02/02/2024, 8:31 PM

## 2024-02-03 DIAGNOSIS — F319 Bipolar disorder, unspecified: Secondary | ICD-10-CM

## 2024-02-03 LAB — MISC LABCORP TEST (SEND OUT): Labcorp test code: 120295

## 2024-02-03 MED ORDER — MIRTAZAPINE 15 MG PO TABS
15.0000 mg | ORAL_TABLET | Freq: Every day | ORAL | Status: DC
  Administered 2024-02-03: 15 mg via ORAL
  Filled 2024-02-03: qty 1

## 2024-02-03 MED ORDER — QUETIAPINE FUMARATE 100 MG PO TABS: 100.0000 mg | ORAL_TABLET | Freq: Every day | ORAL | Status: DC

## 2024-02-03 NOTE — Group Note (Signed)
 Recreation Therapy Group Note   Group Topic:Communication  Group Date: 02/03/2024 Start Time: 0935 End Time: 1000 Facilitators: Haisley Arens-McCall, LRT,CTRS Location: [address] Day[address]   Group Topic: Communication, Problem Solving   Goal Area(s) Addresses:  Patient will effectively listen to complete activity.  Patient will identify communication skills used to make activity successful.  Patient will identify how skills used during activity can be used to reach post d/c goals.    Behavioral Response:   Intervention: Building surveyor Activity - Geometric pattern cards, pencils, blank paper    Activity: Geometric Drawings.  Three volunteers from the peer group will be shown an abstract picture with a particular arrangement of geometrical shapes.  Each round, one 'speaker' will describe the pattern, as accurately as possible without revealing the image to the group.  The remaining group members will listen and draw the picture to reflect how it is described to them. Patients with the role of 'listener' cannot ask clarifying questions but, may request that the speaker repeat a direction. Once the drawings are complete, the presenter will show the rest of the group the picture and compare how close each person came to drawing the picture. LRT will facilitate a post-activity discussion regarding effective communication and the importance of planning, listening, and asking for clarification in daily interactions with others.  Education: Environmental consultant, Active listening, Support systems, Discharge planning  Education Outcome: Acknowledges understanding/In group clarification offered/Needs additional education.    Affect/Mood: N/A   Participation Level: Did not attend    Clinical Observations/Individualized Feedback:      Plan: Continue to engage patient in RT group sessions 2-3x/week.   Bryant Saye-McCall, LRT,CTRS 02/03/2024 11:11 AM

## 2024-02-03 NOTE — Group Note (Signed)
 Occupational Therapy Group Note  Group Topic: Sleep Hygiene  Group Date: 02/03/2024 Start Time: 1500 End Time: 1539 Facilitators: Dot [place] MATSU, OT   Group Description: Group encouraged increased participation and engagement through topic focused on sleep hygiene. Patients reflected on the quality of sleep they typically receive and identified areas that need improvement. Group was given background information on sleep and sleep hygiene, including common sleep disorders. Group members also received information on how to improve one's sleep and introduced a sleep diary as a tool that can be utilized to track sleep quality over a length of time. Group session ended with patients identifying one or more strategies they could utilize or implement into their sleep routine in order to improve overall sleep quality.        Therapeutic Goal(s):  Identify one or more strategies to improve overall sleep hygiene  Identify one or more areas of sleep that are negatively impacted (sleep too much, too little, etc)     Participation Level: Engaged   Participation Quality: Independent   Behavior: Appropriate   Speech/Thought Process: Relevant   Affect/Mood: Appropriate   Insight: Fair   Judgement: Fair      Modes of Intervention: Education  Patient Response to Interventions:  Attentive   Plan: Continue to engage patient in OT groups 2 - 3x/week.  02/03/2024  [place] MATSU Dot, OT   Zillah Alexie, OT

## 2024-02-03 NOTE — Group Note (Signed)
 Date:  02/03/2024 Time:  8:18 PM  Group Topic/Focus:  Wrap-Up Group:   The focus of this group is to help patients review their daily goal of treatment and discuss progress on daily workbooks.    Participation Level:  Active  Participation Quality:  Appropriate  Affect:  Appropriate  Cognitive:  Appropriate  Insight: Appropriate  Engagement in Group:  Engaged  Modes of Intervention:  Discussion  Additional Comments:   Pt attended AA meeting  Karilynn Carranza A Damari Suastegui 02/03/2024, 8:18 PM

## 2024-02-03 NOTE — Progress Notes (Signed)
 [facility] Inpatient Psychiatry Progress Note  Date: 02/03/24 Patient: Vernon Allen MRN: [id number]  Assessment and Plan: Vernon Allen is a 43 y.o. male admitted for symptoms of depression related to chronic ongoing alcohol abuse.   10/13 - Continuing to manage alcohol withdrawal. Patient requesting outpatient intensive SUD treatment. Will explore options. Adjusting quetiapine and mirtazapine to consolidate dosing. Also, question veracity of BPAD diagnosis vs substance induced mood disorder.   # Bipolar I disorder, most recent episode depressed (HCC) - Substance induced mood disorder? - Quetiapine 100 mg at bedtime - mirtazapine 15 mg at bedtime, tapering  # Alcohol use disorder, severe - Gabapentin  100 mg TID - Librium taper, DC after today   Risk Assessment - Low for acute danger to self or others  Discharge Planning Barriers to discharge: medical monitoring of alcohol withdrawal Estimated length of stay: 4-7 days Predicted Discharge location: Home    Interval History and update: Chart reviewed. No significant events overnight. Patient has been med compliant. Highest CIWA was 5 this morning. 1 mg prn lorazepam  given. On assessment today he reported feeling okay. He reports feeling mildly to moderately anxious and jittery related to alcohol withdrawal. He reports feeling depressed largely on account of the consequences of his drinking and states that he is motivated to enter into a treatment program after he discharges. He would like to engage in a IOP or PHP program and states that he does not want to leave the hospital until his medications are right.       Physical Exam MSK/Neuro - Normal gait and station  Mental Status Exam Appearance - Casually dressed, appropriate hygiene and grooming, overweight Attitude - Calm, polite, not guarded Speech - normal volume, prosody, inflection Mood - Okay Affect - Restricted, at times tearful Thought  Process - LLGD Thought Content - No delusional TC expressed SI/HI - Denies  Perceptions - Denies AVH; not RIS Judgement/Insight - [employer] of knowledge - WNL Language - No impairments      Lab Results:  Admission on 01/30/2024  Component Date Value Ref Range Status   Sodium 02/01/2024 140  135 - 145 mmol/L Final   Potassium 02/01/2024 3.7  3.5 - 5.1 mmol/L Final   Chloride 02/01/2024 103  98 - 111 mmol/L Final   CO2 02/01/2024 19 (L)  22 - 32 mmol/L Final   Glucose, Bld 02/01/2024 85  70 - 99 mg/dL Final   BUN 89/88/7974 5 (L)  6 - 20 mg/dL Final   Creatinine, Ser 02/01/2024 0.78  0.61 - 1.24 mg/dL Final   Calcium 89/88/7974 9.2  8.9 - 10.3 mg/dL Final   Total Protein 89/88/7974 6.4 (L)  6.5 - 8.1 g/dL Final   Albumin 89/88/7974 3.5  3.5 - 5.0 g/dL Final   AST 89/88/7974 71 (H)  15 - 41 U/L Final   ALT 02/01/2024 33  0 - 44 U/L Final   Alkaline Phosphatase 02/01/2024 150 (H)  38 - 126 U/L Final   Total Bilirubin 02/01/2024 0.8  0.0 - 1.2 mg/dL Final   GFR, Estimated 02/01/2024 >60  >60 mL/min Final   Anion gap 02/01/2024 18 (H)  5 - 15 Final   Magnesium  02/01/2024 2.1  1.7 - 2.4 mg/dL Final   Ammonia 89/88/7974 43 (H)  9 - 35 umol/L Final   Cholesterol 02/01/2024 637 (H)  0 - 200 mg/dL Final   Triglycerides 89/88/7974 1,519 (H)  <150 mg/dL Final   HDL 89/88/7974 28 (L)  >40 mg/dL  Final   Total CHOL/HDL Ratio 02/01/2024 22.5  RATIO Final   VLDL 02/01/2024 304 (H)  0 - 40 mg/dL Final   LDL Cholesterol 02/01/2024 UNABLE TO CALCULATE IF TRIGLYCERIDE OVER 400 mg/dL  0 - 99 mg/dL Final   Hgb J8r MFr Bld 02/01/2024 5.3  4.8 - 5.6 % Final   Mean Plasma Glucose 02/01/2024 105.41  mg/dL Final   TSH 89/88/7974 2.020  0.350 - 4.500 uIU/mL Final   Hepatitis B Surface Ag 02/01/2024 NON REACTIVE  NON REACTIVE Final   HCV Ab 02/01/2024 NON REACTIVE  NON REACTIVE Final   Hep A IgM 02/01/2024 NON REACTIVE  NON REACTIVE Final   Hep B C IgM 02/01/2024 NON REACTIVE  NON REACTIVE Final   HIV 1  RNA Quant 02/01/2024 <20  copies/mL Corrected   LOG10 HIV-1 RNA 02/01/2024 UNABLE TO CALCULATE  log10copy/mL Final   RPR Ser Ql 02/01/2024 NON REACTIVE  NON REACTIVE Final   Vit D, 25-Hydroxy 02/01/2024 9.62 (L)  30 - 100 ng/mL Final   Vitamin B-12 02/01/2024 221  180 - 914 pg/mL Final   [facility] test code 02/01/2024 [id number]   Final   [facility] test name 02/01/2024 DIRLDL   Final   Source ([facility]) 02/01/2024 PLASMA   Final   Misc [facility] result 02/01/2024 COMMENT   Final     Vitals: Blood pressure 131/88, pulse 88, temperature 97.7 F (36.5 C), temperature source Oral, resp. rate 18, height 5' 9 (1.753 m), weight 104.4 kg, SpO2 98%.    Oliva DELENA Salmon, DO

## 2024-02-03 NOTE — Plan of Care (Signed)
   Problem: Education: Goal: Emotional status will improve Outcome: Progressing Goal: Mental status will improve Outcome: Progressing Goal: Verbalization of understanding the information provided will improve Outcome: Progressing   Problem: Activity: Goal: Interest or engagement in activities will improve Outcome: Progressing

## 2024-02-03 NOTE — [facility] Group Notes (Signed)
 Spirituality Group   Description: Participant directed exploration of values, beliefs and meaning **Focus on sources of hope & possibility from our own experiences  Following a brief framework of chaplain's role and ground rules of group behavior, participants are invited to share concerns or questions that engage spiritual life. Emphasis placed on common themes and shared experiences and ways to make meaning and clarify living into one's values.   Theory/Process/Goal: Utilize the theoretical framework of group therapy established by Celena Kite, Relational Cultural Theory and Rogerian approaches to facilitate relational empathy and use of the "here and now" to foster reflection, self-awareness, and sharing.   Observations: Vernon Allen was engaged in the group disussion. He expressed hopes for his own growth but conveyed some self-judgement, issue around self-forgiveness. Will try to offer follow up support as able.  [name] L. [name] HERO.Div

## 2024-02-03 NOTE — Plan of Care (Signed)
   Problem: Education: Goal: Emotional status will improve Outcome: Progressing Goal: Mental status will improve Outcome: Progressing   Problem: Activity: Goal: Sleeping patterns will improve Outcome: Progressing

## 2024-02-03 NOTE — Progress Notes (Signed)
(  Sleep Hours) -  (Any PRNs that were needed, meds refused, or side effects to meds)- Trazodone  50mg   (Any disturbances and when (visitation, over night)-none  (Concerns raised by the patient)- REFUSED MELATONIN  (SI/HI/AVH)-denies

## 2024-02-03 NOTE — [facility] Group Notes (Signed)
 [facility] Group Notes:  (Nursing/MHT/Case Management/Adjunct)  Date:  02/03/2024  Time:  9:24 AM  Type of Therapy:  Group Therapy  Participation Level:  Active  Participation Quality:  Appropriate  Affect:  Appropriate  Cognitive:  Appropriate  Insight:  Appropriate  Engagement in Group:  Engaged  Modes of Intervention:  Orientation  Summary of Progress/Problems: Pt goal is to work on a plan to get out of here.   Anett Ranker-McCall, LRT,CTRS 02/03/2024, 9:24 AM

## 2024-02-03 NOTE — Progress Notes (Signed)
   02/03/24 0800  Psych Admission Type (Psych Patients Only)  Admission Status Voluntary  Psychosocial Assessment  Patient Complaints Anxiety  Eye Contact Fair  Facial Expression Animated  Affect Appropriate to circumstance  Speech Logical/coherent  Interaction Assertive  Motor Activity Slow  Appearance/Hygiene Unremarkable  Behavior Characteristics Appropriate to situation  Mood Pleasant  Thought Process  Coherency WDL  Content WDL  Delusions None reported or observed  Perception WDL  Hallucination None reported or observed  Judgment Impaired  Confusion None  Danger to Self  Current suicidal ideation? Denies  Description of Suicide Plan No Plan  Agreement Not to Harm Self Yes  Description of Agreement Verbal  Danger to Others  Danger to Others None reported or observed

## 2024-02-03 NOTE — Progress Notes (Signed)
(  Sleep Hours) - 6.5 hours (Any PRNs that were needed, meds refused, or side effects to meds)- None (Any disturbances and when (visitation, overnight)- None (Concerns raised by the patient)- None (SI/HI/AVH)- Denies

## 2024-02-04 DIAGNOSIS — F419 Anxiety disorder, unspecified: Secondary | ICD-10-CM

## 2024-02-04 MED ORDER — QUETIAPINE FUMARATE 25 MG PO TABS
25.0000 mg | ORAL_TABLET | Freq: Two times a day (BID) | ORAL | Status: DC
  Administered 2024-02-04 – 2024-02-11 (×14): 25 mg via ORAL
  Filled 2024-02-04 (×12): qty 1
  Filled 2024-02-04: qty 28
  Filled 2024-02-04 (×2): qty 1
  Filled 2024-02-04: qty 28

## 2024-02-04 MED ORDER — MIRTAZAPINE 7.5 MG PO TABS
7.5000 mg | ORAL_TABLET | Freq: Every day | ORAL | Status: DC
  Administered 2024-02-04: 7.5 mg via ORAL
  Filled 2024-02-04: qty 1

## 2024-02-04 MED ORDER — GABAPENTIN 100 MG PO CAPS
200.0000 mg | ORAL_CAPSULE | Freq: Three times a day (TID) | ORAL | Status: DC
  Administered 2024-02-04 – 2024-02-06 (×6): 200 mg via ORAL
  Filled 2024-02-04 (×6): qty 2

## 2024-02-04 MED ORDER — QUETIAPINE FUMARATE 50 MG PO TABS
150.0000 mg | ORAL_TABLET | Freq: Every day | ORAL | Status: DC
Start: 2024-02-04 — End: 2024-02-05
  Administered 2024-02-04: 150 mg via ORAL
  Filled 2024-02-04: qty 3

## 2024-02-04 NOTE — Group Note (Signed)
 Recreation Therapy Group Note   Group Topic:Leisure Education  Group Date: 02/04/2024 Start Time: 0930 End Time: 1005 Facilitators: Auron Tadros-McCall, LRT,CTRS Location: [address] Dayroom   Group Topic: Leisure Education  Goal Area(s) Addresses:  Patient will successfully identify positive leisure and recreation activities.  Patient will acknowledge benefits of participation in healthy leisure activities post discharge.    Behavioral Response:    Intervention: Competitive Group Game    Activity: Guess the Lyric. In groups or individually, patients will take turns spinning the spinner. Whatever category ([address], Dance, [place], [place], R&B and Hip Hop) the spinner lands on, the individual/group has to fill in the missing song lyric. The spinner also has a section where you can pick the category you want or steal a card from one of the other people/groups. The individual/group with the most cards at the end, wins the game.    Education:  Teacher, English as a foreign language, Leisure as Merchant navy officer, Programmer, applications, Building control surveyor   Education Outcome: Acknowledges education/In group clarification offered/Needs additional education   Affect/Mood: N/A   Participation Level: Did not attend    Clinical Observations/Individualized Feedback:      Plan: Continue to engage patient in RT group sessions 2-3x/week.   Kamoni Gentles-McCall, LRT,CTRS 02/04/2024 12:18 PM

## 2024-02-04 NOTE — [facility] Suicide Risk Assessment (Signed)
 [facility] INPATIENT:  Family/Significant Other Suicide Prevention Education  Suicide Prevention Education:  Education Completed; Vernon Allen (significant other) [phone],  (name of family member/significant other) has been identified by the patient as the family member/significant other with whom the patient will be residing, and identified as the person(s) who will aid the patient in the event of a mental health crisis (suicidal ideations/suicide attempt).  With written consent from the patient, the family member/significant other has been provided the following suicide prevention education, prior to the and/or following the discharge of the patient.  The suicide prevention education provided includes the following: Suicide risk factors Suicide prevention and interventions National Suicide Hotline telephone number [facility] assessment telephone number [facility] Emergency Assistance 911 [facility] and/or Residential Mobile Crisis Unit telephone number  Request made of family/significant other to: Remove weapons (e.g., guns, rifles, knives), all items previously/currently identified as safety concern.   Remove drugs/medications (over-the-counter, prescriptions, illicit drugs), all items previously/currently identified as a safety concern.  Vernon Allen states Vernon Allen has not been to work in a month. He's been very depressed and getting consistently worse. Vernon states he has a lot of ups and downs and has abused alcohol for over 10 years.   Vernon states there are firearms in the home that are out of access to patient. Vernon agrees to remove and safely store medications away from patient access. Vernon Allen is welcome to return home upon discharge.  The family member/significant other verbalizes understanding of the suicide prevention education information provided.  The family member/significant other agrees to remove the items of safety concern listed above.  Vernon Allen 02/04/2024, 1:06 PM

## 2024-02-04 NOTE — Group Note (Signed)
 Date:  02/04/2024 Time:  9:35 AM  Group Topic/Focus:  Goals Group:   The focus of this group is to help patients establish daily goals to achieve during treatment and discuss how the patient can incorporate goal setting into their daily lives to aide in recovery. Patients worked independently on worksheets regarding SMART goals, and the group discussed Maslow's hierarchy and its importance for goal setting.   Participation Level:  Did Not Attend  Participation Quality:  N/A  Affect:  N/A  Cognitive:  N/A  Insight: None  Engagement in Group:  None  Modes of Intervention:  N/A  Additional Comments:  Patient did not attend goals group.  Kristi HERO Collins Dimaria 02/04/2024, 9:35 AM

## 2024-02-04 NOTE — Progress Notes (Signed)
(  Sleep Hours) - (Any PRNs that were needed, meds refused, or side effects to meds)- none (Any disturbances and when (visitation, over night)- none (Concerns raised by the patient)- none (SI/HI/AVH)- denies

## 2024-02-04 NOTE — Group Note (Signed)
 Date:  02/04/2024 Time:  3:57 PM  Group Topic/Focus:  Boundaries: The focus of this group is to discuss five boundary types and describes what healthy, porous, and rigid boundaries look like for each. This encourages clients to reflect on their boundaries, understand how they differ, and identify strengths and weaknesses.    Participation Level:  Active  Participation Quality:  Appropriate  Affect:  Appropriate  Cognitive:  Alert and Appropriate  Insight: Appropriate  Engagement in Group:  Engaged  Modes of Intervention:  Discussion and Education   [name] 02/04/2024, 3:57 PM

## 2024-02-04 NOTE — Plan of Care (Signed)
   Problem: Education: Goal: Emotional status will improve Outcome: Progressing Goal: Mental status will improve Outcome: Progressing Goal: Verbalization of understanding the information provided will improve Outcome: Progressing

## 2024-02-04 NOTE — Progress Notes (Signed)
   02/04/24 1000  Psych Admission Type (Psych Patients Only)  Admission Status Voluntary  Psychosocial Assessment  Patient Complaints Anxiety  Eye Contact Brief  Facial Expression Animated  Affect Appropriate to circumstance  Speech Logical/coherent  Interaction Assertive  Motor Activity Slow  Appearance/Hygiene Unremarkable  Behavior Characteristics Appropriate to situation  Mood Pleasant  Thought Process  Coherency WDL  Content WDL  Delusions None reported or observed  Perception WDL  Hallucination None reported or observed  Judgment Impaired  Confusion None  Danger to Self  Current suicidal ideation? Denies  Description of Suicide Plan No Plan  Agreement Not to Harm Self Yes  Description of Agreement Verbal  Danger to Others  Danger to Others None reported or observed

## 2024-02-04 NOTE — Progress Notes (Signed)
 [facility] Inpatient Psychiatry Progress Note  Date: 02/04/24 Patient: Vernon Allen MRN: [id number]  Assessment and Plan: OLUSEGUN GERSTENBERGER is a 43 y.o. male admitted for symptoms of depression related to chronic ongoing alcohol abuse.   10/13 - Patient is progressing through alcohol withdrawal and it remains well managed. He continues to experience significant anxiety that appears related to the withdrawal and concern over his aftercare. We discussed attempting to manage this initially with gabapentin  and quetiapine given that he is already taking them. We will begin to explore residential rehab options with likely transfer early next week.   # Bipolar I disorder, most recent episode depressed (HCC) - Quetiapine 150 mg at bedtime, 25 mg twice daily. - mirtazapine 7.5 mg at bedtime, tapering  # Alcohol use disorder, severe - Gabapentin  200 mg TID - Librium taper, DC after today  # Unspecified Anxiety Disorder - Quetiapine as above  Risk Assessment - Low for acute danger to self or others  Discharge Planning Barriers to discharge: medical monitoring of alcohol withdrawal Estimated length of stay: 4-7 days Predicted Discharge location: Home    Interval History and update: Chart reviewed. No significant events overnight. Vitals have been stable. Highest CIWA was 5. He has been med compliant. This morning he expressed considerable anxiety over his medications being adjusted yesterday. He worries that his medications will not be right and exhibited a catastrophic thinking cascade regarding this. We reviewed his history and he does endorse periods of elevated mood and energy that are suggestive of hypomania or mania and not clearly only related or due to substance use. He shared more details regarding his life history, to include being adopted and not having any relationship with his biological parents.  He expressed guilt over not being a better husband and  father and identifies alcohol use as a primary contributor to this. He denies any significant withdrawal symptoms today and is tolerating the chlordiapoxide taper thus far. He denied any suicidal ideation today but continues to feel very depressed and is anxious about his aftercare. He expressed interest in discharging to a local 28-day program. He also reported a significant increase in appetite since starting mirtazapine and would like to discontinue this.       Physical Exam MSK/Neuro - Normal gait and station  Mental Status Exam Appearance - Casually dressed, appropriate hygiene and grooming, overweight Attitude - Calm, polite, not guarded Speech - normal volume, prosody, inflection Mood - anxious Affect - Restricted, anxious, at times tearful Thought Process - LLGD Thought Content - No delusional TC expressed SI/HI - Denies  Perceptions - Denies AVH; not RIS Judgement/Insight - Fair Fund of knowledge - WNL Language - No impairments      Lab Results:  Admission on 01/30/2024  Component Date Value Ref Range Status   Sodium 02/01/2024 140  135 - 145 mmol/L Final   Potassium 02/01/2024 3.7  3.5 - 5.1 mmol/L Final   Chloride 02/01/2024 103  98 - 111 mmol/L Final   CO2 02/01/2024 19 (L)  22 - 32 mmol/L Final   Glucose, Bld 02/01/2024 85  70 - 99 mg/dL Final   BUN 89/88/7974 5 (L)  6 - 20 mg/dL Final   Creatinine, Ser 02/01/2024 0.78  0.61 - 1.24 mg/dL Final   Calcium 89/88/7974 9.2  8.9 - 10.3 mg/dL Final   Total Protein 89/88/7974 6.4 (L)  6.5 - 8.1 g/dL Final   Albumin 89/88/7974 3.5  3.5 - 5.0 g/dL Final   AST  02/01/2024 71 (H)  15 - 41 U/L Final   ALT 02/01/2024 33  0 - 44 U/L Final   Alkaline Phosphatase 02/01/2024 150 (H)  38 - 126 U/L Final   Total Bilirubin 02/01/2024 0.8  0.0 - 1.2 mg/dL Final   GFR, Estimated 02/01/2024 >60  >60 mL/min Final   Anion gap 02/01/2024 18 (H)  5 - 15 Final   Magnesium  02/01/2024 2.1  1.7 - 2.4 mg/dL Final   Ammonia 89/88/7974 43 (H)  9  - 35 umol/L Final   Cholesterol 02/01/2024 637 (H)  0 - 200 mg/dL Final   Triglycerides 89/88/7974 1,519 (H)  <150 mg/dL Final   HDL 89/88/7974 28 (L)  >40 mg/dL Final   Total CHOL/HDL Ratio 02/01/2024 22.5  RATIO Final   VLDL 02/01/2024 304 (H)  0 - 40 mg/dL Final   LDL Cholesterol 02/01/2024 UNABLE TO CALCULATE IF TRIGLYCERIDE OVER 400 mg/dL  0 - 99 mg/dL Final   Hgb J8r MFr Bld 02/01/2024 5.3  4.8 - 5.6 % Final   Mean Plasma Glucose 02/01/2024 105.41  mg/dL Final   TSH 89/88/7974 2.020  0.350 - 4.500 uIU/mL Final   Hepatitis B Surface Ag 02/01/2024 NON REACTIVE  NON REACTIVE Final   HCV Ab 02/01/2024 NON REACTIVE  NON REACTIVE Final   Hep A IgM 02/01/2024 NON REACTIVE  NON REACTIVE Final   Hep B C IgM 02/01/2024 NON REACTIVE  NON REACTIVE Final   HIV 1 RNA Quant 02/01/2024 <20  copies/mL Corrected   LOG10 HIV-1 RNA 02/01/2024 UNABLE TO CALCULATE  log10copy/mL Final   RPR Ser Ql 02/01/2024 NON REACTIVE  NON REACTIVE Final   Vit D, 25-Hydroxy 02/01/2024 9.62 (L)  30 - 100 ng/mL Final   Vitamin B-12 02/01/2024 221  180 - 914 pg/mL Final   [facility] test code 02/01/2024 [id number]   Final   [facility] test name 02/01/2024 DIRLDL   Final   Source ([facility]) 02/01/2024 PLASMA   Final   Misc [facility] result 02/01/2024 COMMENT   Final     Vitals: Blood pressure 128/87, pulse 86, temperature 97.7 F (36.5 C), temperature source Oral, resp. rate 16, height 5' 9 (1.753 m), weight 104.4 kg, SpO2 96%.    Oliva DELENA Salmon, DO

## 2024-02-04 NOTE — Group Note (Signed)
 LCSW Group Therapy Note   Group Date: 02/04/2024 Start Time: 1100 End Time: 1200   Participation:  patient was present and actively participated in the discussion.  At times, he was disruptive when laughing.    Type of Therapy:  Group Therapy  Topic: Healing Flames: Navigating Anger with Compassion  Objective:  Foster self-awareness and promote compassion toward oneself and others when dealing with anger.  Goals:  Help participants understand the underlying emotions and needs fueling anger. Provide coping strategies for healthier emotional expression and anger management.  Summary: This session explored anger as a volcano--an explosion driven by deeper feelings and unmet needs. Participants learned to identify anger triggers and underlying emotions, then practiced coping strategies like deep breathing, physical activity, and journaling. The group discussed healthy ways to manage anger before it escalates, using both personal reflection and shared experiences.  Therapeutic Modalities: Cognitive Behavioral Therapy (CBT): Challenging thoughts that fuel anger. Mindfulness: Increasing awareness of emotions and sensations.   Vernon Allen O Payzlee Ryder, LCSWA 02/04/2024  12:40 PM

## 2024-02-04 NOTE — Plan of Care (Signed)
   Problem: Education: Goal: Emotional status will improve Outcome: Progressing Goal: Mental status will improve Outcome: Progressing Goal: Verbalization of understanding the information provided will improve Outcome: Progressing   Problem: Activity: Goal: Interest or engagement in activities will improve Outcome: Progressing

## 2024-02-05 ENCOUNTER — Encounter (HOSPITAL_COMMUNITY): Payer: Self-pay

## 2024-02-05 MED ORDER — QUETIAPINE FUMARATE 200 MG PO TABS
200.0000 mg | ORAL_TABLET | Freq: Every day | ORAL | Status: DC
  Administered 2024-02-05: 200 mg via ORAL
  Filled 2024-02-05: qty 1

## 2024-02-05 NOTE — [facility] Group Notes (Signed)
Patient attended the NA group. ?

## 2024-02-05 NOTE — Group Note (Signed)
 Date:  02/05/2024 Time:  10:06 AM  Group Topic/Focus:  Goals Group:   The focus of this group is to help patients establish daily goals to achieve during treatment and discuss how the patient can incorporate goal setting into their daily lives to aide in recovery.    Participation Level:  Active  Participation Quality:  Appropriate  Affect:  Appropriate  Cognitive:  Alert  Insight: Appropriate  Engagement in Group:  Engaged  Modes of Intervention:  Orientation  Additional Comments:  He came here for alcoholism and depression. Based off seeing the doctors and coming to groups. He feels that this is helping him a lot better. He feels human again and he's getting better for his children.  Raylyn Carton M Avrian Delfavero 02/05/2024, 10:06 AM

## 2024-02-05 NOTE — Group Note (Signed)
 Recreation Therapy Group Note   Group Topic:Problem Solving  Group Date: 02/05/2024 Start Time: 0940 End Time: 1010 Facilitators: Ymani Porcher-McCall, LRT,CTRS Location: [address] Dayroom   Group Topic: Communication, Team Building, Problem Solving   Goal Area(s) Addresses:  Patient will effectively work with peer towards shared goal.  Patient will identify skills used to make activity successful.  Patient will identify how skills used during activity can be used to reach post d/c goals.    Behavioral Response: Engaged   Intervention: STEM Activity   Activity: Stage manager. In teams of 3-5, patients were given 12 plastic drinking straws and an equal length of masking tape. Using the materials provided, patients were asked to build a landing pad to catch a golf ball dropped from approximately 5 feet in the air. All materials were required to be used by the team in their design. LRT facilitated post-activity discussion.   Education: Pharmacist, community, Scientist, physiological, Discharge Planning    Education Outcome: Acknowledges education/In group clarification offered/Needs additional education.     Affect/Mood: Appropriate   Participation Level: Did not attend   Participation Quality: Independent   Behavior: Appropriate   Speech/Thought Process: Focused   Insight: Good   Judgement: Good   Modes of Intervention: STEM Activity   Patient Response to Interventions:  Engaged   Education Outcome:  In group clarification offered    Clinical Observations/Individualized Feedback: Pt was engaged and equally shared in creating and constructing landing pad with peers. Pt was pleasant and focused throughout group.     Plan: Continue to engage patient in RT group sessions 2-3x/week.   Indiya Izquierdo-McCall, LRT,CTRS 02/05/2024 12:01 PM

## 2024-02-05 NOTE — Group Note (Signed)
 Date:  02/05/2024 Time:  4:38 PM  Group Topic/Focus:  Personal Choices and Values:   The focus of this group is to help patients assess and explore the importance of values in their lives, how their values affect their decisions, how they express their values and what opposes their expression.    Participation Level:  Active  Participation Quality:  Appropriate  Affect:  Appropriate  Cognitive:  Appropriate  Insight: Appropriate  Engagement in Group:  Engaged  Modes of Intervention:  Discussion and Education    [name] 02/05/2024, 4:38 PM

## 2024-02-05 NOTE — Progress Notes (Signed)
 Spirituality group facilitated by [name], BCC.  Group Description: Group focused on topic of hope. Patients participated in facilitated discussion around topic, connecting with one another around experiences and definitions for hope. Group members engaged with visual explorer photos, reflecting on what hope looks like for them today. Group engaged in discussion around how their definitions of hope are present today in hospital.  Modalities: Psycho-social ed, Adlerian, Narrative, MI  Patient Progress: Vernon Allen attended group and actively engaged and participated in group conversation and activities.  He shared about his determination to heal and make different decisions. Comments demonstrated good insight and contributed positively to the group conversation.

## 2024-02-05 NOTE — BHH Group Notes (Deleted)
 Adult Psychoeducational Group Note  Date:  02/05/2024 Time:  8:18 PM  Group Topic/Focus:  Wrap-Up Group:   The focus of this group is to help patients review their daily goal of treatment and discuss progress on daily workbooks.  Participation Level:  Active  Participation Quality:  Appropriate  Affect:  Appropriate  Cognitive:  Appropriate  Insight: Appropriate  Engagement in Group:  Engaged  Modes of Intervention:  Discussion  Additional Comments:  Damon rate his da 8. Goal for the day anttend all groups and share. Coping skills running and jogging.Favorite part of the day the medication part of the group.  Lang Drilling Long 02/05/2024, 8:18 PM

## 2024-02-05 NOTE — Progress Notes (Signed)
   02/05/24 1800  Psych Admission Type (Psych Patients Only)  Admission Status Voluntary  Psychosocial Assessment  Patient Complaints Anxiety  Eye Contact Brief  Facial Expression Animated;Anxious  Affect Appropriate to circumstance  Speech Logical/coherent  Interaction Assertive  Motor Activity Slow  Appearance/Hygiene Unremarkable  Behavior Characteristics Cooperative;Appropriate to situation  Mood Pleasant;Anxious  Thought Process  Coherency WDL  Content WDL  Delusions None reported or observed  Perception WDL  Hallucination None reported or observed  Judgment Impaired  Confusion None  Danger to Self  Current suicidal ideation? Denies  Agreement Not to Harm Self Yes  Description of Agreement Verbal  Danger to Others  Danger to Others None reported or observed

## 2024-02-05 NOTE — Plan of Care (Signed)
   Problem: Education: Goal: Knowledge of [place] [facility] Education information/materials will improve Outcome: Progressing Goal: Emotional status will improve Outcome: Progressing Goal: Mental status will improve Outcome: Progressing

## 2024-02-05 NOTE — Progress Notes (Signed)
 [facility] Inpatient Psychiatry Progress Note  Date: 02/05/24 Patient: Vernon Allen MRN: [id number]  Assessment and Plan: Vernon Allen is a 43 y.o. male admitted for symptoms of depression related to chronic ongoing alcohol abuse.   10/15 - Patient reports improved anxiety today. Will recheck labs. Lipid panel was very high. Suspect this was incorrect so will order a repeat tomorrow morning. Increase quetiapine. Likely DC CIWA tomorrow if vitals and other measure remain low.   # Bipolar I disorder, most recent episode depressed (HCC) - Quetiapine 200 mg at bedtime, 25 mg twice daily. - DC mirtazapine  # Alcohol use disorder, severe - Gabapentin  200 mg TID - Librium taper, DC after today  # Unspecified Anxiety Disorder - Quetiapine as above  Risk Assessment - Low for acute danger to self or others  Discharge Planning Barriers to discharge: medical monitoring of alcohol withdrawal Estimated length of stay: 4-7 days Predicted Discharge location: Inpatient S[facility] rehab    Interval History and update: Chart reviewed. No significant events overnight. CIWAs have not been elevated. Patient slept for around 8 hours last night and stated that he felt reasonably rested without grogginess. Anxiety is improved today, but still moderate to severe. He reports ongoing depression and feelings of guilt but also motivation for abstinences. He still wants to participate in a 28 day program. Denies any significant alcohol cravings today. He expressed some concern over the health of his liver and would like his labs rechecked. He otherwise has been visible in the milieu and participating well in groups.       Physical Exam MSK/Neuro - Normal gait and station  Mental Status Exam Appearance - Casually dressed, appropriate hygiene and grooming, overweight Attitude - Calm, polite, not guarded Speech - normal volume, prosody, inflection Mood - a little better Affect -  Restricted, anxious Thought Process - LLGD Thought Content - No delusional TC expressed SI/HI - Denies  Perceptions - Denies AVH; not RIS Judgement/Insight - Fair Fund of knowledge - WNL Language - No impairments      Lab Results:  Admission on 01/30/2024  Component Date Value Ref Range Status   Sodium 02/01/2024 140  135 - 145 mmol/L Final   Potassium 02/01/2024 3.7  3.5 - 5.1 mmol/L Final   Chloride 02/01/2024 103  98 - 111 mmol/L Final   CO2 02/01/2024 19 (L)  22 - 32 mmol/L Final   Glucose, Bld 02/01/2024 85  70 - 99 mg/dL Final   BUN 89/88/7974 5 (L)  6 - 20 mg/dL Final   Creatinine, Ser 02/01/2024 0.78  0.61 - 1.24 mg/dL Final   Calcium 89/88/7974 9.2  8.9 - 10.3 mg/dL Final   Total Protein 89/88/7974 6.4 (L)  6.5 - 8.1 g/dL Final   Albumin 89/88/7974 3.5  3.5 - 5.0 g/dL Final   AST 89/88/7974 71 (H)  15 - 41 U/L Final   ALT 02/01/2024 33  0 - 44 U/L Final   Alkaline Phosphatase 02/01/2024 150 (H)  38 - 126 U/L Final   Total Bilirubin 02/01/2024 0.8  0.0 - 1.2 mg/dL Final   GFR, Estimated 02/01/2024 >60  >60 mL/min Final   Anion gap 02/01/2024 18 (H)  5 - 15 Final   Magnesium  02/01/2024 2.1  1.7 - 2.4 mg/dL Final   Ammonia 89/88/7974 43 (H)  9 - 35 umol/L Final   Cholesterol 02/01/2024 637 (H)  0 - 200 mg/dL Final   Triglycerides 89/88/7974 1,519 (H)  <150 mg/dL Final  HDL 02/01/2024 28 (L)  >40 mg/dL Final   Total CHOL/HDL Ratio 02/01/2024 22.5  RATIO Final   VLDL 02/01/2024 304 (H)  0 - 40 mg/dL Final   LDL Cholesterol 02/01/2024 UNABLE TO CALCULATE IF TRIGLYCERIDE OVER 400 mg/dL  0 - 99 mg/dL Final   Hgb J8r MFr Bld 02/01/2024 5.3  4.8 - 5.6 % Final   Mean Plasma Glucose 02/01/2024 105.41  mg/dL Final   TSH 89/88/7974 2.020  0.350 - 4.500 uIU/mL Final   Hepatitis B Surface Ag 02/01/2024 NON REACTIVE  NON REACTIVE Final   HCV Ab 02/01/2024 NON REACTIVE  NON REACTIVE Final   Hep A IgM 02/01/2024 NON REACTIVE  NON REACTIVE Final   Hep B C IgM 02/01/2024 NON REACTIVE   NON REACTIVE Final   HIV 1 RNA Quant 02/01/2024 <20  copies/mL Corrected   LOG10 HIV-1 RNA 02/01/2024 UNABLE TO CALCULATE  log10copy/mL Final   RPR Ser Ql 02/01/2024 NON REACTIVE  NON REACTIVE Final   Vit D, 25-Hydroxy 02/01/2024 9.62 (L)  30 - 100 ng/mL Final   Vitamin B-12 02/01/2024 221  180 - 914 pg/mL Final   [facility] test code 02/01/2024 [id number]   Final   [facility] test name 02/01/2024 DIRLDL   Final   Source ([facility]) 02/01/2024 PLASMA   Final   Misc [facility] result 02/01/2024 COMMENT   Final     Vitals: Blood pressure (!) 123/90, pulse 94, temperature (!) 97.4 F (36.3 C), temperature source Oral, resp. rate 20, height 5' 9 (1.753 m), weight 104.4 kg, SpO2 98%.    Vernon DELENA Salmon, DO

## 2024-02-05 NOTE — Plan of Care (Signed)
   Problem: Education: Goal: Knowledge of [place] [facility] Education information/materials will improve Outcome: Progressing Goal: Emotional status will improve Outcome: Progressing

## 2024-02-05 NOTE — BHH Group Notes (Signed)
 Adult Psychoeducational Group Note  Date:  02/05/2024 Time:  8:14 AM  Group Topic/Focus:  Wrap-Up Group:   The focus of this group is to help patients review their daily goal of treatment and discuss progress on daily workbooks.  Participation Level:  Active  Participation Quality:  Appropriate  Affect:  Appropriate  Cognitive:  Appropriate  Insight: Appropriate  Engagement in Group:  Engaged  Modes of Intervention:  Discussion  Additional Comments:  Vernon Allen said his day was a 10. His goal to be happy and ne achieved his goal. Coping skills comedy and laughing. His favorite part of the day laughing  [name] 02/05/2024, 8:14 AM

## 2024-02-05 NOTE — BH IP Treatment Plan (Signed)
 Interdisciplinary Treatment and Diagnostic Plan Update  02/05/2024 Time of Session: 9:15AM - UPDATE Vernon Allen MRN: [id number]  Principal Diagnosis: Bipolar I disorder, most recent episode depressed (HCC)  Secondary Diagnoses: Principal Problem:   Bipolar I disorder, most recent episode depressed (HCC) Active Problems:   Arthritis   EtOH dependence (HCC)   Major depressive disorder, recurrent severe without psychotic features (HCC)   Substance induced mood disorder (HCC)   Bipolar I disorder, single manic episode, severe, with psychosis (HCC)   Alcohol use disorder   Tobacco use disorder   Chronic back pain   Increased ammonia level   Vitamin D deficiency   Current Medications:  Current Facility-Administered Medications  Medication Dose Route Frequency Provider Last Rate Last Admin   acetaminophen  (TYLENOL ) tablet 650 mg  650 mg Oral Q6H PRN Smith, Annie B, NP   650 mg at 02/01/24 1645   alum & mag hydroxide-simeth (MAALOX/MYLANTA) 200-200-20 MG/5ML suspension 30 mL  30 mL Oral Q4H PRN Smith, Annie B, NP       haloperidol  (HALDOL ) tablet 5 mg  5 mg Oral TID PRN Smith, Annie B, NP       And   diphenhydrAMINE  (BENADRYL ) capsule 50 mg  50 mg Oral TID PRN Smith, Annie B, NP       haloperidol  lactate (HALDOL ) injection 5 mg  5 mg Intramuscular TID PRN Smith, Annie B, NP       And   diphenhydrAMINE  (BENADRYL ) injection 50 mg  50 mg Intramuscular TID PRN Smith, Annie B, NP       And   LORazepam  (ATIVAN ) injection 2 mg  2 mg Intramuscular TID PRN Smith, Annie B, NP       haloperidol  lactate (HALDOL ) injection 10 mg  10 mg Intramuscular TID PRN Smith, Annie B, NP       And   diphenhydrAMINE  (BENADRYL ) injection 50 mg  50 mg Intramuscular TID PRN Smith, Annie B, NP       And   LORazepam  (ATIVAN ) injection 2 mg  2 mg Intramuscular TID PRN Smith, Annie B, NP       EPINEPHrine  (EPI-PEN) injection 0.3 mg  0.3 mg Intramuscular PRN Trudy Carwin, NP       feeding supplement (ENSURE  PLUS HIGH PROTEIN) liquid 237 mL  237 mL Oral BID BM McLauchlin, Angela, NP   237 mL at 02/05/24 1027   folic acid (FOLVITE) tablet 1 mg  1 mg Oral Daily Smith, Annie B, NP   1 mg at 02/05/24 9250   gabapentin  (NEURONTIN ) capsule 200 mg  200 mg Oral TID Prentis Kitchens A, DO   200 mg at 02/05/24 0748   lactulose (CHRONULAC) 10 GM/15ML solution 10 g  10 g Oral BID Leigh Corean Massa, MD   10 g at 02/05/24 9251   magnesium  hydroxide (MILK OF MAGNESIA) suspension 30 mL  30 mL Oral Daily PRN Smith, Annie B, NP       melatonin tablet 5 mg  5 mg Oral QHS Hill, Corean Massa, MD   5 mg at 02/02/24 2059   mirtazapine (REMERON) tablet 7.5 mg  7.5 mg Oral QHS Prentis Kitchens A, DO   7.5 mg at 02/04/24 2057   multivitamin with minerals tablet 1 tablet  1 tablet Oral Daily Smith, Annie B, NP   1 tablet at 02/05/24 0749   ondansetron  (ZOFRAN ) tablet 4 mg  4 mg Oral Q8H PRN Smith, Annie B, NP   4 mg at 01/30/24 2118  pantoprazole  (PROTONIX ) EC tablet 40 mg  40 mg Oral Daily Hill, Corean Massa, MD   40 mg at 02/05/24 0749   QUEtiapine (SEROQUEL) tablet 150 mg  150 mg Oral QHS Prentis Kitchens A, DO   150 mg at 02/04/24 2057   QUEtiapine (SEROQUEL) tablet 25 mg  25 mg Oral BID Prentis Kitchens A, DO   25 mg at 02/05/24 0749   thiamine  (Vitamin B-1) tablet 100 mg  100 mg Oral Daily Smith, Annie B, NP   100 mg at 02/05/24 9251   Or   thiamine  (VITAMIN B1) injection 100 mg  100 mg Intravenous Daily Smith, Annie B, NP       traZODone  (DESYREL ) tablet 50 mg  50 mg Oral QHS McLauchlin, Angela, NP   50 mg at 02/04/24 2057   Vitamin D (Ergocalciferol) (DRISDOL) 1.25 MG (50000 UNIT) capsule 50,000 Units  50,000 Units Oral Q7 days Leigh Corean Massa, MD   50,000 Units at 02/01/24 1646   PTA Medications: Medications Prior to Admission  Medication Sig Dispense Refill Last Dose/Taking   ARIPiprazole  (ABILIFY ) 5 MG tablet Take 1 tablet (5 mg total) by mouth daily. (Patient not taking: Reported on 01/30/2024) 30 tablet 0     cyclobenzaprine  (FLEXERIL ) 5 MG tablet Take 1 tablet (5 mg total) by mouth 3 (three) times daily as needed. (Patient not taking: Reported on 01/30/2024) 30 tablet 0    DULoxetine  (CYMBALTA ) 20 MG capsule Take 1 capsule (20 mg total) by mouth at bedtime. (Patient not taking: Reported on 01/30/2024) 30 capsule 0    melatonin 5 MG TABS Take 1 tablet (5 mg total) by mouth at bedtime. 30 tablet 0    oxyCODONE  (OXY IR/ROXICODONE ) 5 MG immediate release tablet Take 1-2 tablets (5-10 mg total) by mouth every 4 (four) hours as needed for moderate pain (pain score 4-6). (Patient not taking: Reported on 01/30/2024) 40 tablet 0    topiramate  (TOPAMAX ) 50 MG tablet Take 1 tablet (50 mg total) by mouth daily. (Patient not taking: Reported on 01/30/2024) 30 tablet 0     Patient Stressors: Medication change or noncompliance   Substance abuse    Patient Strengths: Forensic psychologist fund of knowledge  Motivation for treatment/growth  Supportive family/friends   Treatment Modalities: Medication Management, Group therapy, Case management,  1 to 1 session with clinician, Psychoeducation, Recreational therapy.   Physician Treatment Plan for Primary Diagnosis: Bipolar I disorder, most recent episode depressed (HCC) Long Term Goal(s): Improvement in symptoms so as ready for discharge   Short Term Goals: Ability to identify changes in lifestyle to reduce recurrence of condition will improve Ability to verbalize feelings will improve Ability to disclose and discuss suicidal ideas Ability to demonstrate self-control will improve Ability to identify and develop effective coping behaviors will improve Ability to maintain clinical measurements within normal limits will improve Compliance with prescribed medications will improve Ability to identify triggers associated with substance abuse/mental health issues will improve  Medication Management: Evaluate patient's response, side effects, and tolerance of  medication regimen.  Therapeutic Interventions: 1 to 1 sessions, Unit Group sessions and Medication administration.  Evaluation of Outcomes: Progressing  Physician Treatment Plan for Secondary Diagnosis: Principal Problem:   Bipolar I disorder, most recent episode depressed (HCC) Active Problems:   Arthritis   EtOH dependence (HCC)   Major depressive disorder, recurrent severe without psychotic features (HCC)   Substance induced mood disorder (HCC)   Bipolar I disorder, single manic episode, severe, with psychosis (HCC)  Alcohol use disorder   Tobacco use disorder   Chronic back pain   Increased ammonia level   Vitamin D deficiency  Long Term Goal(s): Improvement in symptoms so as ready for discharge   Short Term Goals: Ability to identify changes in lifestyle to reduce recurrence of condition will improve Ability to verbalize feelings will improve Ability to disclose and discuss suicidal ideas Ability to demonstrate self-control will improve Ability to identify and develop effective coping behaviors will improve Ability to maintain clinical measurements within normal limits will improve Compliance with prescribed medications will improve Ability to identify triggers associated with substance abuse/mental health issues will improve     Medication Management: Evaluate patient's response, side effects, and tolerance of medication regimen.  Therapeutic Interventions: 1 to 1 sessions, Unit Group sessions and Medication administration.  Evaluation of Outcomes: Progressing   RN Treatment Plan for Primary Diagnosis: Bipolar I disorder, most recent episode depressed (HCC) Long Term Goal(s): Knowledge of disease and therapeutic regimen to maintain health will improve  Short Term Goals: Ability to remain free from injury will improve, Ability to verbalize frustration and anger appropriately will improve, Ability to demonstrate self-control, Ability to participate in decision making  will improve, and Ability to verbalize feelings will improve  Medication Management: RN will administer medications as ordered by provider, will assess and evaluate patient's response and provide education to patient for prescribed medication. RN will report any adverse and/or side effects to prescribing provider.  Therapeutic Interventions: 1 on 1 counseling sessions, Psychoeducation, Medication administration, Evaluate responses to treatment, Monitor vital signs and CBGs as ordered, Perform/monitor CIWA, COWS, AIMS and Fall Risk screenings as ordered, Perform wound care treatments as ordered.  Evaluation of Outcomes: Progressing   LCSW Treatment Plan for Primary Diagnosis: Bipolar I disorder, most recent episode depressed (HCC) Long Term Goal(s): Safe transition to appropriate next level of care at discharge, Engage patient in therapeutic group addressing interpersonal concerns.  Short Term Goals: Engage patient in aftercare planning with referrals and resources, Increase social support, Increase ability to appropriately verbalize feelings, Increase emotional regulation, and Facilitate acceptance of mental health diagnosis and concerns  Therapeutic Interventions: Assess for all discharge needs, 1 to 1 time with Social worker, Explore available resources and support systems, Assess for adequacy in community support network, Educate family and significant other(s) on suicide prevention, Complete Psychosocial Assessment, Interpersonal group therapy.  Evaluation of Outcomes: Progressing   Progress in Treatment: Attending groups: No. Participating in groups: No. Taking medication as prescribed: Yes. Toleration medication: Yes. Family/Significant other contact made: No, will contact:  Burnard Right, significant other [phone] (lives together) OR Mom Andres [phone] Patient understands diagnosis: Yes. Discussing patient identified problems/goals with staff: Yes. Medical problems  stabilized or resolved: Yes. Denies suicidal/homicidal ideation: Yes. Issues/concerns per patient self-inventory: No.   New problem(s) identified:  No   New Short Term/Long Term Goal(s):  detox, medication management for mood stabilization; elimination of SI thoughts; development of comprehensive mental wellness/sobriety plan     Patient Goals:  I'm trying to detox from alcohol and start taking my bipolar mediations.  I feel depressed, and it's harder to face people.  I want to get myself right so I can be there for my family.  I have a home to go to, and I'm trying to keep my job.     Discharge Plan or Barriers:  Patient recently admitted. CSW will continue to follow and assess for appropriate referrals and possible discharge planning.      Reason  for Continuation of Hospitalization: Depression Medication stabilization   Estimated Length of Stay:  5 - 7 days  Last 3 [place] Suicide Severity Risk Score: Flowsheet Row [facility] (Current[facility] Most recent reading at 01/30/2024  8:36 PM ED from 01/30/2024 in [facility] Emergency Department at [facility] Most recent reading at 01/30/2024  1:38 PM ED from 01/08/2024 in [facility] Emergency Department at [facility] Most recent reading at 01/08/2024  9:52 AM  C-SSRS RISK CATEGORY Low Risk Low Risk No Risk    Last PHQ 2/9 Scores:    06/14/2023    3:21 PM 03/02/2022    2:07 PM  Depression screen PHQ 2/9  Decreased Interest 0 0  Down, Depressed, Hopeless 0 0  PHQ - 2 Score 0 0    Scribe for Treatment Team: Mandrell Vangilder M Justino Boze, [place] 02/05/2024 11:27 AM

## 2024-02-06 LAB — LIPID PANEL
Cholesterol: 434 mg/dL — ABNORMAL HIGH (ref 0–200)
HDL: 50 mg/dL (ref >40)
LDL Cholesterol: UNDETERMINED mg/dL (ref 0–99)
Total CHOL/HDL Ratio: 8.6 ratio
Triglycerides: 537 mg/dL — ABNORMAL HIGH (ref <150)
VLDL: 107 mg/dL — ABNORMAL HIGH (ref 0–40)

## 2024-02-06 LAB — COMPREHENSIVE METABOLIC PANEL WITH GFR
ALT: 25 U/L (ref 0–44)
AST: 45 U/L — ABNORMAL HIGH (ref 15–41)
Albumin: 3.7 g/dL (ref 3.5–5.0)
Alkaline Phosphatase: 124 U/L (ref 38–126)
Anion gap: 10 (ref 5–15)
BUN: 12 mg/dL (ref 6–20)
CO2: 26 mmol/L (ref 22–32)
Calcium: 9.6 mg/dL (ref 8.9–10.3)
Chloride: 105 mmol/L (ref 98–111)
Creatinine, Ser: 0.77 mg/dL (ref 0.61–1.24)
GFR, Estimated: >60 mL/min (ref >60)
Glucose, Bld: 109 mg/dL — ABNORMAL HIGH (ref 70–99)
Potassium: 4.5 mmol/L (ref 3.5–5.1)
Sodium: 142 mmol/L (ref 135–145)
Total Bilirubin: 0.5 mg/dL (ref 0.0–1.2)
Total Protein: 6.7 g/dL (ref 6.5–8.1)

## 2024-02-06 LAB — AMMONIA: Ammonia: 37 umol/L — ABNORMAL HIGH (ref 9–35)

## 2024-02-06 MED ORDER — GABAPENTIN 300 MG PO CAPS
300.0000 mg | ORAL_CAPSULE | Freq: Three times a day (TID) | ORAL | Status: DC
  Administered 2024-02-06 – 2024-02-08 (×6): 300 mg via ORAL
  Filled 2024-02-06 (×6): qty 1

## 2024-02-06 MED ORDER — QUETIAPINE FUMARATE 300 MG PO TABS
300.0000 mg | ORAL_TABLET | Freq: Every day | ORAL | Status: DC
  Administered 2024-02-06 – 2024-02-10 (×5): 300 mg via ORAL
  Filled 2024-02-06: qty 14
  Filled 2024-02-06 (×5): qty 1

## 2024-02-06 NOTE — Progress Notes (Signed)
 [facility] Inpatient Psychiatry Progress Note  Date: 02/06/24 Patient: Vernon Allen MRN: [id number]  Assessment and Plan: Vernon Allen is a 43 y.o. male admitted for symptoms of depression related to chronic ongoing alcohol abuse.   10/16 - Patient appears to be making progress. He is now likely outside the withdrawal window so will DC CIWA. He needs placement, however, in an inpatient program. He remains very anxious and expresses passive to conditional active SI related to the worry of not receiving treatment. Plan to increase gabapentin  today and increase quetiapine to 300 mg at bedtime.  # Bipolar I disorder, most recent episode depressed (HCC) - Quetiapine 300 mg at bedtime, 25 mg twice daily.   # Alcohol use disorder, severe - Gabapentin  300 mg TID - Librium taper, DC after today  # Unspecified Anxiety Disorder - Quetiapine as above  Risk Assessment - Low for acute danger to self or others while inpatient. Risk appears to be at least intermediate given passive SI if discharged today without transfer to rehab.  Discharge Planning Barriers to discharge: medical monitoring of alcohol withdrawal Estimated length of stay: 4-7 days Predicted Discharge location: Inpatient [facility] rehab    Interval History and update: Chart reviewed. No significant events overnight. He reports sleeping well last night and did not feel groggy in the morning. He reports that the quetiapine continues to be helpful in managing his anxiety and he feels that the scheduled daytime doses are beneficial and he would prefer to keep it scheduled as opposed to prn. He denies any significant alcohol withdrawal sxs today and continues to express motivation to go to a 28-day program. He expresses tenuous hope, but still presents very anxious and expresses suicidal ideation at the idea of being discharged prematurely.        Physical Exam MSK/Neuro - Normal gait and station  Mental  Status Exam Appearance - Casually dressed, appropriate hygiene and grooming, overweight Attitude - Calm, polite, not guarded Speech - normal volume, prosody, inflection Mood - okay Affect - Mildly restricted Thought Process - LLGD Thought Content - No delusional TC expressed SI/HI - Passive  Perceptions - Denies AVH; not RIS Judgement/Insight - [employer] of knowledge - WNL Language - No impairments      Lab Results:  Admission on 01/30/2024  Component Date Value Ref Range Status   Sodium 02/01/2024 140  135 - 145 mmol/L Final   Potassium 02/01/2024 3.7  3.5 - 5.1 mmol/L Final   Chloride 02/01/2024 103  98 - 111 mmol/L Final   CO2 02/01/2024 19 (L)  22 - 32 mmol/L Final   Glucose, Bld 02/01/2024 85  70 - 99 mg/dL Final   BUN 89/88/7974 5 (L)  6 - 20 mg/dL Final   Creatinine, Ser 02/01/2024 0.78  0.61 - 1.24 mg/dL Final   Calcium 89/88/7974 9.2  8.9 - 10.3 mg/dL Final   Total Protein 89/88/7974 6.4 (L)  6.5 - 8.1 g/dL Final   Albumin 89/88/7974 3.5  3.5 - 5.0 g/dL Final   AST 89/88/7974 71 (H)  15 - 41 U/L Final   ALT 02/01/2024 33  0 - 44 U/L Final   Alkaline Phosphatase 02/01/2024 150 (H)  38 - 126 U/L Final   Total Bilirubin 02/01/2024 0.8  0.0 - 1.2 mg/dL Final   GFR, Estimated 02/01/2024 >60  >60 mL/min Final   Anion gap 02/01/2024 18 (H)  5 - 15 Final   Magnesium  02/01/2024 2.1  1.7 - 2.4 mg/dL Final  Ammonia 02/01/2024 43 (H)  9 - 35 umol/L Final   Cholesterol 02/01/2024 637 (H)  0 - 200 mg/dL Final   Triglycerides 89/88/7974 1,519 (H)  <150 mg/dL Final   HDL 89/88/7974 28 (L)  >40 mg/dL Final   Total CHOL/HDL Ratio 02/01/2024 22.5  RATIO Final   VLDL 02/01/2024 304 (H)  0 - 40 mg/dL Final   LDL Cholesterol 02/01/2024 UNABLE TO CALCULATE IF TRIGLYCERIDE OVER 400 mg/dL  0 - 99 mg/dL Final   Hgb J8r MFr Bld 02/01/2024 5.3  4.8 - 5.6 % Final   Mean Plasma Glucose 02/01/2024 105.41  mg/dL Final   TSH 89/88/7974 2.020  0.350 - 4.500 uIU/mL Final   Hepatitis B Surface  Ag 02/01/2024 NON REACTIVE  NON REACTIVE Final   HCV Ab 02/01/2024 NON REACTIVE  NON REACTIVE Final   Hep A IgM 02/01/2024 NON REACTIVE  NON REACTIVE Final   Hep B C IgM 02/01/2024 NON REACTIVE  NON REACTIVE Final   HIV 1 RNA Quant 02/01/2024 <20  copies/mL Corrected   LOG10 HIV-1 RNA 02/01/2024 UNABLE TO CALCULATE  log10copy/mL Final   RPR Ser Ql 02/01/2024 NON REACTIVE  NON REACTIVE Final   Vit D, 25-Hydroxy 02/01/2024 9.62 (L)  30 - 100 ng/mL Final   Vitamin B-12 02/01/2024 221  180 - 914 pg/mL Final   [facility] test code 02/01/2024 [id number]   Final   [facility] test name 02/01/2024 DIRLDL   Final   Source ([facility]) 02/01/2024 PLASMA   Final   Misc [facility] result 02/01/2024 COMMENT   Final   Sodium 02/06/2024 142  135 - 145 mmol/L Final   Potassium 02/06/2024 4.5  3.5 - 5.1 mmol/L Final   Chloride 02/06/2024 105  98 - 111 mmol/L Final   CO2 02/06/2024 26  22 - 32 mmol/L Final   Glucose, Bld 02/06/2024 109 (H)  70 - 99 mg/dL Final   BUN 89/83/7974 12  6 - 20 mg/dL Final   Creatinine, Ser 02/06/2024 0.77  0.61 - 1.24 mg/dL Final   Calcium 89/83/7974 9.6  8.9 - 10.3 mg/dL Final   Total Protein 89/83/7974 6.7  6.5 - 8.1 g/dL Final   Albumin 89/83/7974 3.7  3.5 - 5.0 g/dL Final   AST 89/83/7974 45 (H)  15 - 41 U/L Final   ALT 02/06/2024 25  0 - 44 U/L Final   Alkaline Phosphatase 02/06/2024 124  38 - 126 U/L Final   Total Bilirubin 02/06/2024 0.5  0.0 - 1.2 mg/dL Final   GFR, Estimated 02/06/2024 >60  >60 mL/min Final   Anion gap 02/06/2024 10  5 - 15 Final   Cholesterol 02/06/2024 434 (H)  0 - 200 mg/dL Final   Triglycerides 89/83/7974 537 (H)  <150 mg/dL Final   HDL 89/83/7974 50  >40 mg/dL Final   Total CHOL/HDL Ratio 02/06/2024 8.6  RATIO Final   VLDL 02/06/2024 107 (H)  0 - 40 mg/dL Final   LDL Cholesterol 02/06/2024 UNABLE TO CALCULATE IF TRIGLYCERIDE OVER 400 mg/dL  0 - 99 mg/dL Final   Ammonia 89/83/7974 37 (H)  9 - 35 umol/L Final     Vitals: Blood pressure 115/87, pulse 87,  temperature (!) 97.2 F (36.2 C), temperature source Oral, resp. rate 20, height 5' 9 (1.753 m), weight 104.4 kg, SpO2 97%.    Oliva DELENA Salmon, DO

## 2024-02-06 NOTE — Group Note (Signed)
 Date:  02/06/2024 Time:  9:41 PM  Group Topic/Focus:  Wrap-Up Group:   The focus of this group is to help patients review their daily goal of treatment and discuss progress on daily workbooks.    Participation Level:  Active  Participation Quality:  Appropriate  Affect:  Appropriate  Cognitive:  Appropriate  Insight: Appropriate and Good  Engagement in Group:  Engaged  Modes of Intervention:  Discussion  Additional Comments:  Patient stated that he had a good day it was a 7 out of 10.    Bari Moats 02/06/2024, 9:41 PM

## 2024-02-06 NOTE — Progress Notes (Signed)
(  Sleep Hours) -7.75 (Any PRNs that were needed, meds refused, or side effects to meds)- none (Any disturbances and when (visitation, over night)-none (Concerns raised by the patient)- none (SI/HI/AVH)-denies all

## 2024-02-06 NOTE — Progress Notes (Signed)
 Patient denies SI/HI/AVH this morning. Pt rates his depression a 3/10 and anxiety a 5/10. Pt reports that he slept good last night. Pt expresses some irritation regarding another patient on the unit today. Pt reports I won't let them get a reaction out of me though I want to get out of here. Pt reports that he is waiting on placement at [facility]. Pt has been interactive on the unit and participating in groups throughout the day. Pt has been calm and cooperative throughout the day. Patient has been compliant with medications and treatment plan. Q 15 minute safety checks are in place for patient's safety. Patient is currently safe on the unit.   02/06/24 0824  Psych Admission Type (Psych Patients Only)  Admission Status Voluntary  Psychosocial Assessment  Patient Complaints Anxiety;Depression;Irritability  Eye Contact Fair  Facial Expression Animated  Affect Appropriate to circumstance  Speech Logical/coherent  Interaction Assertive  Motor Activity Slow  Appearance/Hygiene Unremarkable  Behavior Characteristics Cooperative  Mood Depressed;Anxious  Thought Process  Coherency WDL  Content WDL  Delusions None reported or observed  Perception WDL  Hallucination None reported or observed  Judgment Impaired  Confusion None  Danger to Self  Current suicidal ideation? Denies  Description of Suicide Plan No plan  Agreement Not to Harm Self Yes  Description of Agreement Pt verbally contracts for safety  Danger to Others  Danger to Others None reported or observed

## 2024-02-06 NOTE — Progress Notes (Signed)
 CONTACT NOTE:   CSW left voicemail for Olam Mixer from [place] [phone] to confirm she received fax for patient.   SIGNED: Brittinie Wherley Nunez-Uva, LCSW-A

## 2024-02-06 NOTE — Plan of Care (Signed)
   Problem: Education: Goal: Knowledge of [place] [facility] Education information/materials will improve Outcome: Progressing Goal: Verbalization of understanding the information provided will improve Outcome: Progressing

## 2024-02-06 NOTE — Group Note (Signed)
 LCSW Group Therapy Note   Group Date: 02/06/2024 Start Time: 1100 End Time: 1200   Participation:  patient was present.  He listened and was respectful but didn't participate in the discussion.  Type of Therapy:  Group Therapy  Topic:  Shining from Within:  Confidence and Self-Love Journey  Objective:  To support participants in developing confidence and self-love through self-awareness, self-compassion, and practical skills that nurture personal growth.   Group Goals Encourage self-reflection and self-acceptance by identifying personal strengths and achievements. Teach skills to challenge negative self-talk and replace it with supportive, truthful self-talk. Foster resilience and self-worth through [employer], gratitude, and self-care practices.   Summary:  This group explores the connection between confidence and self-love by guiding participants through reflection, mindset shifts, and practical tools like affirmations, strength recognition, and goal-setting. Activities are designed to promote self-compassion, build emotional resilience, and normalize the slow, patient journey of inner growth.   Therapeutic Modalities Used: Cognitive Behavioral Therapy (CBT): Challenging and reframing unhelpful self-talk. Motivational Interviewing (MI): Encouraging small, achievable goals. Elements of Dialectical Behavioral Therapist (DBT):  Mindfulness and Self-Compassion: Promoting present-moment awareness and kindness toward self.   Shashana Fullington O Davie Sagona, LCSWA 02/06/2024  12:25 PM

## 2024-02-06 NOTE — Progress Notes (Signed)
 Pt did not attend goals group.

## 2024-02-07 LAB — MISC LABCORP TEST (SEND OUT): Labcorp test code: 120295

## 2024-02-07 NOTE — Plan of Care (Signed)
   Problem: Education: Goal: Knowledge of [facility] Education information/materials will improve Outcome: Progressing Goal: Emotional status will improve Outcome: Progressing Goal: Mental status will improve Outcome: Progressing Goal: Verbalization of understanding the information provided will improve Outcome: Progressing

## 2024-02-07 NOTE — Plan of Care (Signed)
   Problem: Education: Goal: Knowledge of [place] [facility] Education information/materials will improve Outcome: Progressing Goal: Emotional status will improve Outcome: Progressing Goal: Mental status will improve Outcome: Progressing

## 2024-02-07 NOTE — Progress Notes (Signed)
 CONTACT NOTE:   [facility] [phone]   CSW spoke with admissions from [facility] who confirmed patient is approved for treatment. Patient is to arrive to facility at 12:30 pm on 02/11/24.  SIGNED: Kailen Hinkle Nunez-Uva, LCSW-A  02/07/24 11:53 AM

## 2024-02-07 NOTE — Progress Notes (Signed)
(  Sleep Hours) -9.0 (Any PRNs that were needed, meds refused, or side effects to meds)- none (Any disturbances and when (visitation, over night)-none (Concerns raised by the patient)- none (SI/HI/AVH)- denies all

## 2024-02-07 NOTE — Progress Notes (Signed)
 Patient denies SI/HI/AVH this morning. Pt rates their depression a 0/10 and anxiety a 0/10. Pt reports that they slept well last night. Pt reports that he woke up feeling even better today than yesterday. Pt has been interactive on the unit and participating in groups throughout the day. Pt has been calm and cooperative throughout the day. Patient has been compliant with medications and treatment plan. Q 15 minute safety checks are in place for patient's safety. Patient is currently safe on the unit.   02/07/24 0803  Psych Admission Type (Psych Patients Only)  Admission Status Voluntary  Psychosocial Assessment  Patient Complaints None  Eye Contact Fair  Facial Expression Animated  Affect Appropriate to circumstance  Speech Logical/coherent  Interaction Assertive  Motor Activity Other (Comment) (WDL)  Appearance/Hygiene Unremarkable  Behavior Characteristics Cooperative;Appropriate to situation  Mood Pleasant  Thought Process  Coherency WDL  Content WDL  Delusions None reported or observed  Perception WDL  Hallucination None reported or observed  Judgment Impaired  Confusion None  Danger to Self  Current suicidal ideation? Denies  Description of Suicide Plan No plan  Agreement Not to Harm Self Yes  Description of Agreement Pt verbally contracts for safety  Danger to Others  Danger to Others None reported or observed

## 2024-02-07 NOTE — Progress Notes (Signed)
 [facility] Inpatient Psychiatry Progress Note  Date: 02/07/24 Patient: Vernon Allen MRN: [id number]  Assessment and Plan: Vernon Allen is a 43 y.o. male admitted for symptoms of depression related to chronic ongoing alcohol abuse.   10/17 - Accepted at [facility]. Anxiety is slowly improving. Plan to hold on the unit until he can be directly transferred to [facility] on 10/21.   # Bipolar I disorder, most recent episode depressed (HCC) - Quetiapine 300 mg at bedtime, 25 mg twice daily.   # Alcohol use disorder, severe - Gabapentin  300 mg TID, increase to 400 mg TID tomorrow  # Unspecified Anxiety Disorder - Quetiapine as above  Risk Assessment - Low for acute danger to self or others while inpatient. Risk appears to be at least intermediate given passive SI if discharged today without transfer to rehab.  Discharge Planning Barriers to discharge: medical monitoring of alcohol withdrawal Estimated length of stay: until 10/21 Predicted Discharge location: [facility]    Interval History and update: Chart reviewed. No significant events overnight. Patient slept well last night. Denies that quetiapine is too sedating. Reports that anxiety has been improved. He is experiencing alcohol cravings and is concerned that if discharged he will relapse, which will result in a return of SI.  He reports that his mood is improved and more hopeful. He was accepted to [facility] with a start date of 10/21, which is the earliest available. He endorsed passive SI.    He reports sleeping well last night and did not feel groggy in the morning. He reports that the quetiapine continues to be helpful in managing his anxiety and he feels that the scheduled daytime doses are beneficial and he would prefer to keep it scheduled as opposed to prn. He denies any significant alcohol withdrawal sxs today and continues to express motivation to go to a 28-day program. He expresses tenuous hope, but still  presents very anxious and expresses suicidal ideation at the idea of being discharged prematurely.        Physical Exam MSK/Neuro - Normal gait and station  Mental Status Exam Appearance - Casually dressed, appropriate hygiene and grooming, overweight Attitude - Calm, polite, not guarded Speech - normal volume, prosody, inflection Mood - alright Affect - Mildly restricted Thought Process - LLGD Thought Content - No delusional TC expressed SI/HI - Passive  Perceptions - Denies AVH; not RIS Judgement/Insight - [employer] knowledge - WNL Language - No impairments      Lab Results:  Admission on 01/30/2024  Component Date Value Ref Range Status   Sodium 02/01/2024 140  135 - 145 mmol/L Final   Potassium 02/01/2024 3.7  3.5 - 5.1 mmol/L Final   Chloride 02/01/2024 103  98 - 111 mmol/L Final   CO2 02/01/2024 19 (L)  22 - 32 mmol/L Final   Glucose, Bld 02/01/2024 85  70 - 99 mg/dL Final   BUN 89/88/7974 5 (L)  6 - 20 mg/dL Final   Creatinine, Ser 02/01/2024 0.78  0.61 - 1.24 mg/dL Final   Calcium 89/88/7974 9.2  8.9 - 10.3 mg/dL Final   Total Protein 89/88/7974 6.4 (L)  6.5 - 8.1 g/dL Final   Albumin 89/88/7974 3.5  3.5 - 5.0 g/dL Final   AST 89/88/7974 71 (H)  15 - 41 U/L Final   ALT 02/01/2024 33  0 - 44 U/L Final   Alkaline Phosphatase 02/01/2024 150 (H)  38 - 126 U/L Final   Total Bilirubin 02/01/2024 0.8  0.0 -  1.2 mg/dL Final   GFR, Estimated 02/01/2024 >60  >60 mL/min Final   Anion gap 02/01/2024 18 (H)  5 - 15 Final   Magnesium  02/01/2024 2.1  1.7 - 2.4 mg/dL Final   Ammonia 89/88/7974 43 (H)  9 - 35 umol/L Final   Cholesterol 02/01/2024 637 (H)  0 - 200 mg/dL Final   Triglycerides 89/88/7974 1,519 (H)  <150 mg/dL Final   HDL 89/88/7974 28 (L)  >40 mg/dL Final   Total CHOL/HDL Ratio 02/01/2024 22.5  RATIO Final   VLDL 02/01/2024 304 (H)  0 - 40 mg/dL Final   LDL Cholesterol 02/01/2024 UNABLE TO CALCULATE IF TRIGLYCERIDE OVER 400 mg/dL  0 - 99 mg/dL Final   Hgb  J8r MFr Bld 02/01/2024 5.3  4.8 - 5.6 % Final   Mean Plasma Glucose 02/01/2024 105.41  mg/dL Final   TSH 89/88/7974 2.020  0.350 - 4.500 uIU/mL Final   Hepatitis B Surface Ag 02/01/2024 NON REACTIVE  NON REACTIVE Final   HCV Ab 02/01/2024 NON REACTIVE  NON REACTIVE Final   Hep A IgM 02/01/2024 NON REACTIVE  NON REACTIVE Final   Hep B C IgM 02/01/2024 NON REACTIVE  NON REACTIVE Final   HIV 1 RNA Quant 02/01/2024 <20  copies/mL Corrected   LOG10 HIV-1 RNA 02/01/2024 UNABLE TO CALCULATE  log10copy/mL Final   RPR Ser Ql 02/01/2024 NON REACTIVE  NON REACTIVE Final   Vit D, 25-Hydroxy 02/01/2024 9.62 (L)  30 - 100 ng/mL Final   Vitamin B-12 02/01/2024 221  180 - 914 pg/mL Final   [facility] test code 02/01/2024 [id number]   Final   [facility] test name 02/01/2024 DIRLDL   Final   Source ([facility]) 02/01/2024 PLASMA   Final   Misc [facility] result 02/01/2024 COMMENT   Final   Sodium 02/06/2024 142  135 - 145 mmol/L Final   Potassium 02/06/2024 4.5  3.5 - 5.1 mmol/L Final   Chloride 02/06/2024 105  98 - 111 mmol/L Final   CO2 02/06/2024 26  22 - 32 mmol/L Final   Glucose, Bld 02/06/2024 109 (H)  70 - 99 mg/dL Final   BUN 89/83/7974 12  6 - 20 mg/dL Final   Creatinine, Ser 02/06/2024 0.77  0.61 - 1.24 mg/dL Final   Calcium 89/83/7974 9.6  8.9 - 10.3 mg/dL Final   Total Protein 89/83/7974 6.7  6.5 - 8.1 g/dL Final   Albumin 89/83/7974 3.7  3.5 - 5.0 g/dL Final   AST 89/83/7974 45 (H)  15 - 41 U/L Final   ALT 02/06/2024 25  0 - 44 U/L Final   Alkaline Phosphatase 02/06/2024 124  38 - 126 U/L Final   Total Bilirubin 02/06/2024 0.5  0.0 - 1.2 mg/dL Final   GFR, Estimated 02/06/2024 >60  >60 mL/min Final   Anion gap 02/06/2024 10  5 - 15 Final   Cholesterol 02/06/2024 434 (H)  0 - 200 mg/dL Final   Triglycerides 89/83/7974 537 (H)  <150 mg/dL Final   HDL 89/83/7974 50  >40 mg/dL Final   Total CHOL/HDL Ratio 02/06/2024 8.6  RATIO Final   VLDL 02/06/2024 107 (H)  0 - 40 mg/dL Final   LDL Cholesterol  02/06/2024 UNABLE TO CALCULATE IF TRIGLYCERIDE OVER 400 mg/dL  0 - 99 mg/dL Final   Ammonia 89/83/7974 37 (H)  9 - 35 umol/L Final     Vitals: Blood pressure (!) 130/95, pulse (!) 105, temperature (!) 97.3 F (36.3 C), temperature source Oral, resp. rate 16, height 5' 9 (  1.753 m), weight 104.4 kg, SpO2 98%.    Oliva DELENA Salmon, DO

## 2024-02-07 NOTE — [facility] Group Notes (Signed)
 Adult Psychoeducational Group Note  Date:  02/07/2024 Time:  9:35 PM  Group Topic/Focus:  Wrap-Up Group:   The focus of this group is to help patients review their daily goal of treatment and discuss progress on daily workbooks.  Participation Level:  Active  Participation Quality:  Appropriate  Affect:  Appropriate  Cognitive:  Appropriate  Insight: Appropriate  Engagement in Group:  Engaged  Modes of Intervention:  Discussion  Additional Comments:  Pt told that today was a good day on the unit, the highlight of which was learning he was accepted into [facility], which he is hopeful about. On the subject of short term goals, Pt mentioned wanting to learn more coping skills. Pt rated his day a 7 out of 10.  Ammanda Dobbins Lee 02/07/2024, 9:35 PM

## 2024-02-07 NOTE — Group Note (Signed)
 Recreation Therapy Group Note   Group Topic:Health and Wellness  Group Date: 02/07/2024 Start Time: 0940 End Time: 1005 Facilitators: Miloh Alcocer-McCall, LRT,CTRS Location: [address] Dayroom   Group Topic: Wellness  Goal Area(s) Addresses:  Patient will define components of whole wellness. Patient will verbalize benefit of whole wellness.  Behavioral Response: Active  Intervention: Music  Activity: Exercise. Patients took turns leading group in the exercises of their choosing. Patients determined the difficulty of the exercises presented in group. Patients were encouraged not to do any exercise that was to difficult or aggravated any previous injuries. Patients were also encouraged to take breaks or get water as needed.     Education: Wellness, Building control surveyor.   Education Outcome: Acknowledges education/In group clarification offered/Needs additional education.    Affect/Mood: Appropriate   Participation Level: Active   Participation Quality: Independent   Behavior: Appropriate   Speech/Thought Process: Focused   Insight: Good   Judgement: Good   Modes of Intervention: Music   Patient Response to Interventions:  Engaged   Education Outcome:  In group clarification offered    Clinical Observations/Individualized Feedback: Pt came to group for the last few minutes. Pt led group in stretch for the back. Pt was bright and completed the exercises he could.     Plan: Continue to engage patient in RT group sessions 2-3x/week.   Alexxia Stankiewicz-McCall, LRT,CTRS 02/07/2024 12:31 PM

## 2024-02-07 NOTE — Group Note (Signed)
 Date:  02/07/2024 Time:  10:02 AM  Group Topic/Focus:  Goals Group:   The focus of this group is to help patients establish daily goals to achieve during treatment and discuss how the patient can incorporate goal setting into their daily lives to aide in recovery.    Participation Level:  Active  Participation Quality:  Appropriate  Affect:  Appropriate  Cognitive:  Alert  Insight: Good  Engagement in Group:  Improving  Modes of Intervention:  Orientation  Additional Comments:  He is doing better and is doing well with his anxiety. Just when he leaves he wans to fo to a better place and still make sure his family is okay while he's getting the help he needs  Dolores CHRISTELLA Fredericks 02/07/2024, 10:02 AM

## 2024-02-07 NOTE — Group Note (Signed)
 Date:  02/07/2024 Time:  5:10 PM  Group Topic/Focus: Identifying resources and support systems  Crisis Planning:   The purpose of this group is to help patients create a crisis plan for use upon discharge or in the future, as needed.    Participation Level:  Active  Participation Quality:  Appropriate  Affect:  Appropriate  Cognitive:  Appropriate  Insight: Appropriate  Engagement in Group:  Engaged  Modes of Intervention:  Discussion  Additional Comments:  Pt engaged appropriately in group.  Dannetta Lekas D Araseli Sherry 02/07/2024, 5:10 PM

## 2024-02-08 MED ORDER — GABAPENTIN 400 MG PO CAPS
400.0000 mg | ORAL_CAPSULE | Freq: Three times a day (TID) | ORAL | Status: DC
  Administered 2024-02-08 – 2024-02-11 (×8): 400 mg via ORAL
  Filled 2024-02-08 (×8): qty 1

## 2024-02-08 NOTE — Progress Notes (Signed)
(  Sleep Hours) - 9.25 (Any PRNs that were needed, meds refused, or side effects to meds)- none (Any disturbances and when (visitation, over night)- none (Concerns raised by the patient)- none (SI/HI/AVH)- denies

## 2024-02-08 NOTE — Group Note (Signed)
 [facility] LCSW Group Therapy Note  02/08/2024  10:00-11:00AM  Type of Therapy and Topic:  Group Therapy:  Building Supports and Asking for Help  Participation Level:  Active   Description of Group:  Patients in this group wished to discuss whether it is a weakness to ask for help.  Processing of this question and related matters took place while group facilitator ensured that all patients had an opportunity to speak, responses were on topic, and the direction of the discussion was positive in nature.    Therapeutic Goals: 1)  discuss why asking for help can be a strength rather than a weakness  2)  explore individual patients' experience in asking for assistance  3)  encourage mutual support among group members  4)  demonstrate the importance of adding supports and of setting boundaries   Summary of Patient Progress:  The patient expressed full comprehension of the concepts presented.  He spoke up little in group but when he did, he explained his thoughts clearly and had a positive impact on other group members.  He stated that God is his main help and he does not believe that his concept of God takes problems from him until he is ready to give them up.  Therapeutic Modalities:   Processing Demonstration  Elgie JINNY Crest, LCSW 02/08/2024 1:40 PM

## 2024-02-08 NOTE — Group Note (Signed)
 Date:  02/08/2024 Time:  9:05 PM  Group Topic/Focus:  Wrap-Up Group:   The focus of this group is to help patients review their daily goal of treatment and discuss progress on daily workbooks.    Participation Level:  Active  Participation Quality:  Appropriate  Affect:  Appropriate  Cognitive:  Appropriate  Insight: Improving  Engagement in Group:  Engaged  Modes of Intervention:  Discussion  Additional Comments:  Pt attended the evening wrap-up group. Tech introduced the staff for the evening, reminded group of the evening schedule and reminded them to ask for anything they need. Pt read and discussed a story named [employer]. Pt read and shared how he understood the story and his interpretation.   Rutherford PARAS Kerriann Kamphuis 02/08/2024, 9:05 PM

## 2024-02-08 NOTE — Plan of Care (Signed)
   Problem: Education: Goal: Emotional status will improve Outcome: Progressing Goal: Mental status will improve Outcome: Progressing

## 2024-02-08 NOTE — Progress Notes (Signed)
   02/08/24 2249  Psych Admission Type (Psych Patients Only)  Admission Status Voluntary  Psychosocial Assessment  Patient Complaints None  Eye Contact Fair  Facial Expression Animated  Affect Appropriate to circumstance  Speech Logical/coherent  Interaction Assertive  Motor Activity Other (Comment) (WDL)  Appearance/Hygiene Unremarkable  Behavior Characteristics Appropriate to situation  Mood Pleasant  Thought Process  Coherency WDL  Content WDL  Delusions None reported or observed  Perception WDL  Hallucination None reported or observed  Judgment Impaired  Confusion None  Danger to Self  Current suicidal ideation? Denies  Agreement Not to Harm Self Yes  Description of Agreement verbal  Danger to Others  Danger to Others None reported or observed

## 2024-02-08 NOTE — Progress Notes (Signed)
 [facility] Inpatient Psychiatry Progress Note  Date: 02/08/24 Patient: TITUS DRONE MRN: [id number]  Assessment and Plan: Vernon Allen is a 43 y.o. male admitted for symptoms of depression related to chronic ongoing alcohol abuse.   10/18 - Accepted at [facility]. Anxiety worse today. Plan to hold on the unit until he can be directly transferred to [facility] on 10/21.   # Bipolar I disorder, most recent episode depressed (HCC) - Quetiapine 300 mg at bedtime, 25 mg twice daily.   # Alcohol use disorder, severe - Gabapentin  400 mg TID  # Unspecified Anxiety Disorder - Quetiapine as above  Risk Assessment - Low for acute danger to self or others while inpatient. Risk appears to be at least intermediate given passive SI if discharged today without transfer to rehab.  Discharge Planning Barriers to discharge: medical monitoring of alcohol withdrawal Estimated length of stay: until 10/21 Predicted Discharge location: [facility]    Interval History and update: Chart reviewed. No significant events overnight. Patient slept well last night. Continues to take quetiapine and gabapentin  with good effect. Reports improved pain control. Denies significant cravings for alcohol today. Reports that his mood is mildly depressed. Denies anhedonia or SI. He reports feeling more anxious today due to increasing interpersonal stressors with his partner, and he is worried that they may split. He continues to state that he doesn't think I can make it on the outside if discharged prior to rehab, especially given this new stressor.        Physical Exam MSK/Neuro - Normal gait and station  Mental Status Exam Appearance - Casually dressed, appropriate hygiene and grooming, overweight Attitude - Calm, polite, not guarded Speech - normal volume, prosody, inflection Mood - alright Affect - Mildly restricted Thought Process - LLGD Thought Content - No delusional TC expressed SI/HI -  Passive  Perceptions - Denies AVH; not RIS Judgement/Insight - [employer] of knowledge - WNL Language - No impairments      Lab Results:  Admission on 01/30/2024  Component Date Value Ref Range Status   Sodium 02/01/2024 140  135 - 145 mmol/L Final   Potassium 02/01/2024 3.7  3.5 - 5.1 mmol/L Final   Chloride 02/01/2024 103  98 - 111 mmol/L Final   CO2 02/01/2024 19 (L)  22 - 32 mmol/L Final   Glucose, Bld 02/01/2024 85  70 - 99 mg/dL Final   BUN 89/88/7974 5 (L)  6 - 20 mg/dL Final   Creatinine, Ser 02/01/2024 0.78  0.61 - 1.24 mg/dL Final   Calcium 89/88/7974 9.2  8.9 - 10.3 mg/dL Final   Total Protein 89/88/7974 6.4 (L)  6.5 - 8.1 g/dL Final   Albumin 89/88/7974 3.5  3.5 - 5.0 g/dL Final   AST 89/88/7974 71 (H)  15 - 41 U/L Final   ALT 02/01/2024 33  0 - 44 U/L Final   Alkaline Phosphatase 02/01/2024 150 (H)  38 - 126 U/L Final   Total Bilirubin 02/01/2024 0.8  0.0 - 1.2 mg/dL Final   GFR, Estimated 02/01/2024 >60  >60 mL/min Final   Anion gap 02/01/2024 18 (H)  5 - 15 Final   Magnesium  02/01/2024 2.1  1.7 - 2.4 mg/dL Final   Ammonia 89/88/7974 43 (H)  9 - 35 umol/L Final   Cholesterol 02/01/2024 637 (H)  0 - 200 mg/dL Final   Triglycerides 89/88/7974 1,519 (H)  <150 mg/dL Final   HDL 89/88/7974 28 (L)  >40 mg/dL Final   Total CHOL/HDL Ratio  02/01/2024 22.5  RATIO Final   VLDL 02/01/2024 304 (H)  0 - 40 mg/dL Final   LDL Cholesterol 02/01/2024 UNABLE TO CALCULATE IF TRIGLYCERIDE OVER 400 mg/dL  0 - 99 mg/dL Final   Hgb J8r MFr Bld 02/01/2024 5.3  4.8 - 5.6 % Final   Mean Plasma Glucose 02/01/2024 105.41  mg/dL Final   TSH 89/88/7974 2.020  0.350 - 4.500 uIU/mL Final   Hepatitis B Surface Ag 02/01/2024 NON REACTIVE  NON REACTIVE Final   HCV Ab 02/01/2024 NON REACTIVE  NON REACTIVE Final   Hep A IgM 02/01/2024 NON REACTIVE  NON REACTIVE Final   Hep B C IgM 02/01/2024 NON REACTIVE  NON REACTIVE Final   HIV 1 RNA Quant 02/01/2024 <20  copies/mL Corrected   LOG10 HIV-1 RNA  02/01/2024 UNABLE TO CALCULATE  log10copy/mL Final   RPR Ser Ql 02/01/2024 NON REACTIVE  NON REACTIVE Final   Vit D, 25-Hydroxy 02/01/2024 9.62 (L)  30 - 100 ng/mL Final   Vitamin B-12 02/01/2024 221  180 - 914 pg/mL Final   [facility] test code 02/01/2024 [id number]   Final   [facility] test name 02/01/2024 DIRLDL   Final   Source ([facility]) 02/01/2024 PLASMA   Final   Misc [facility] result 02/01/2024 COMMENT   Final   Sodium 02/06/2024 142  135 - 145 mmol/L Final   Potassium 02/06/2024 4.5  3.5 - 5.1 mmol/L Final   Chloride 02/06/2024 105  98 - 111 mmol/L Final   CO2 02/06/2024 26  22 - 32 mmol/L Final   Glucose, Bld 02/06/2024 109 (H)  70 - 99 mg/dL Final   BUN 89/83/7974 12  6 - 20 mg/dL Final   Creatinine, Ser 02/06/2024 0.77  0.61 - 1.24 mg/dL Final   Calcium 89/83/7974 9.6  8.9 - 10.3 mg/dL Final   Total Protein 89/83/7974 6.7  6.5 - 8.1 g/dL Final   Albumin 89/83/7974 3.7  3.5 - 5.0 g/dL Final   AST 89/83/7974 45 (H)  15 - 41 U/L Final   ALT 02/06/2024 25  0 - 44 U/L Final   Alkaline Phosphatase 02/06/2024 124  38 - 126 U/L Final   Total Bilirubin 02/06/2024 0.5  0.0 - 1.2 mg/dL Final   GFR, Estimated 02/06/2024 >60  >60 mL/min Final   Anion gap 02/06/2024 10  5 - 15 Final   Cholesterol 02/06/2024 434 (H)  0 - 200 mg/dL Final   Triglycerides 89/83/7974 537 (H)  <150 mg/dL Final   HDL 89/83/7974 50  >40 mg/dL Final   Total CHOL/HDL Ratio 02/06/2024 8.6  RATIO Final   VLDL 02/06/2024 107 (H)  0 - 40 mg/dL Final   LDL Cholesterol 02/06/2024 UNABLE TO CALCULATE IF TRIGLYCERIDE OVER 400 mg/dL  0 - 99 mg/dL Final   Ammonia 89/83/7974 37 (H)  9 - 35 umol/L Final   [facility] test code 02/06/2024 [id number]   Final   [facility] test name 02/06/2024 DIRECT LDL   Final   Source ([facility]) 02/06/2024 PLASMA LT GREEN TUBE   Final   Misc [facility] result 02/06/2024 COMMENT   Final     Vitals: Blood pressure 118/89, pulse 89, temperature (!) 97.4 F (36.3 C), temperature source Oral, resp. rate 18, height 5'  9 (1.753 m), weight 104.4 kg, SpO2 98%.    Oliva DELENA Salmon, DO

## 2024-02-08 NOTE — Group Note (Signed)
 Date:  02/08/2024 Time:  9:20 AM  Group Topic/Focus:  Goals Group:   The focus of this group is to help patients establish daily goals to achieve during treatment and discuss how the patient can incorporate goal setting into their daily lives to aide in recovery. Orientation:   The focus of this group is to educate the patient on the purpose and policies of crisis stabilization and provide a format to answer questions about their admission.  The group details unit policies and expectations of patients while admitted.    Participation Level:  Did Not Attend

## 2024-02-08 NOTE — Progress Notes (Addendum)
 D. Pt has been friendly during interactions, visible in the milieu, observed attending groups. Pt reported sleeping well last night, described his appetite and concentration as 'good', and energy level as 'normal'. Per pt's self inventory, pt rated his depression,hopelessness and anxiety a 2/1/4, respectively.Pt currently denies SI/HI and AVH and doesn't appear to be responding to internal stimuli.  A. Labs and vitals monitored. Pt given Tylenol  for 6/10 hip pain related to playing outside for rec therapy.  Pt supported emotionally and encouraged to express concerns and ask questions.   R. Pt remains safe with 15 minute checks. Will continue POC.    02/08/24 1100  Psych Admission Type (Psych Patients Only)  Admission Status Voluntary  Psychosocial Assessment  Patient Complaints None  Eye Contact Fair  Facial Expression Animated  Affect Appropriate to circumstance  Speech Logical/coherent  Interaction Assertive  Motor Activity Other (Comment) (level 3 observation)  Appearance/Hygiene Unremarkable  Behavior Characteristics Cooperative  Mood Pleasant  Thought Process  Coherency WDL  Content WDL  Delusions None reported or observed  Perception WDL  Hallucination None reported or observed  Judgment Impaired  Confusion None  Danger to Self  Current suicidal ideation? Denies  Agreement Not to Harm Self Yes  Description of Agreement agreed to contact staff before acting on harmful thoughts  Danger to Others  Danger to Others None reported or observed

## 2024-02-08 NOTE — Group Note (Signed)
 Date:  02/08/2024 Time:  6:58 PM  Group Topic/Focus:  Making Healthy Choices:   The focus of this group is to help patients identify negative/unhealthy choices they were using prior to admission and identify positive/healthier coping strategies to replace them upon discharge. Self Care:   The focus of this group is to help patients understand the importance of self-care in order to improve or restore emotional, physical, spiritual, interpersonal, and financial health. Gut health: Taught about what the microbiome of the intestines and how it affects our physical and mental health. Taught how to care for it.    Participation Level:  Active  Participation Quality:  Appropriate  Affect:  Appropriate  Cognitive:  Appropriate  Insight: Appropriate  Engagement in Group:  Engaged  Modes of Intervention:  Discussion and Education  Additional Comments:    Vernon Allen 02/08/2024, 6:58 PM

## 2024-02-08 NOTE — Plan of Care (Signed)
   Problem: Education: Goal: Knowledge of [facility] Education information/materials will improve Outcome: Progressing Goal: Emotional status will improve Outcome: Progressing Goal: Mental status will improve Outcome: Progressing Goal: Verbalization of understanding the information provided will improve Outcome: Progressing

## 2024-02-09 NOTE — Progress Notes (Signed)
(  Sleep Hours) - 7.5 hours (Any PRNs that were needed, meds refused, or side effects to meds)- None (Any disturbances and when (visitation, over night)- None (Concerns raised by the patient)-  None (SI/HI/AVH)- Denies

## 2024-02-09 NOTE — Plan of Care (Signed)
   Problem: Education: Goal: Emotional status will improve Outcome: Progressing   Problem: Education: Goal: Mental status will improve Outcome: Progressing

## 2024-02-09 NOTE — Progress Notes (Signed)
   02/09/24 2243  Psych Admission Type (Psych Patients Only)  Admission Status Voluntary  Psychosocial Assessment  Patient Complaints Substance abuse;Depression  Eye Contact Fair  Facial Expression Animated  Affect Appropriate to circumstance  Speech Logical/coherent  Interaction Assertive  Motor Activity Fidgety  Appearance/Hygiene Unremarkable  Behavior Characteristics Appropriate to situation  Mood Pleasant;Anxious  Thought Process  Coherency WDL  Content WDL  Delusions None reported or observed  Perception WDL  Hallucination None reported or observed  Judgment WDL  Confusion None  Danger to Self  Current suicidal ideation? Denies  Agreement Not to Harm Self Yes  Description of Agreement verbal  Danger to Others  Danger to Others None reported or observed

## 2024-02-09 NOTE — Group Note (Signed)
 Date:  02/09/2024 Time:  9:36 AM  Group Topic/Focus:  Goals Group:   The focus of this group is to help patients establish daily goals to achieve during treatment and discuss how the patient can incorporate goal setting into their daily lives to aide in recovery. Orientation:   The focus of this group is to educate the patient on the purpose and policies of crisis stabilization and provide a format to answer questions about their admission.  The group details unit policies and expectations of patients while admitted.    Participation Level:  Active  Participation Quality:  Appropriate  Affect:  Appropriate  Cognitive:  Appropriate  Insight: Appropriate  Engagement in Group:  Engaged  Modes of Intervention:  Discussion and Orientation  Additional Comments:    Sharlot Sturkey D Ayaan Shutes 02/09/2024, 9:36 AM

## 2024-02-09 NOTE — Progress Notes (Signed)
 Pt was out in the milieu during the day acting appropriately. Went to [employer] and ate adequately. Attending group activity and participated. Denies SI/HI/SH/paranoia/AVH. Will continue to monitor. Expressed profound impact from group and is making a recommitment to God.

## 2024-02-09 NOTE — Progress Notes (Signed)
 [facility] Inpatient Psychiatry Progress Note  Date: 02/09/24 Patient: Vernon Allen MRN: [id number]  Assessment and Plan: Vernon Allen is a 43 y.o. male admitted for symptoms of depression related to chronic ongoing alcohol abuse.   10/19 - Accepted at [facility]. Plan to hold on the unit until he can be directly transferred to [facility] on 10/21.   # Bipolar I disorder, most recent episode depressed (HCC) - Quetiapine 300 mg at bedtime, 25 mg twice daily.   # Alcohol use disorder, severe - Gabapentin  400 mg TID  # Unspecified Anxiety Disorder - Quetiapine as above  Risk Assessment - Low for acute danger to self or others while inpatient. Risk appears to be at least intermediate given passive SI if discharged today without transfer to rehab.  Discharge Planning Barriers to discharge: medical monitoring of alcohol withdrawal Estimated length of stay: until 10/21 Predicted Discharge location: [facility]    Interval History and update: Chart reviewed. No significant events overnight. Patient reports feeling well today and feels stable on his medications. He reports mild anxiety and denies any significant side effects from his current medications. He is pending transfer to [facility] on Tuesday the 21st. He continues to be visible in the milieu and active and participating in groups.      Physical Exam MSK/Neuro - Normal gait and station  Mental Status Exam Appearance - Casually dressed, appropriate hygiene and grooming, overweight Attitude - Calm, polite, not guarded Speech - normal volume, prosody, inflection Mood - pretty good Affect - Mildly restricted Thought Process - LLGD Thought Content - No delusional TC expressed SI/HI - Passive  Perceptions - Denies AVH; not RIS Judgement/Insight - [employer] of knowledge - WNL Language - No impairments      Lab Results:  Admission on 01/30/2024  Component Date Value Ref Range Status   Sodium 02/01/2024 140  135  - 145 mmol/L Final   Potassium 02/01/2024 3.7  3.5 - 5.1 mmol/L Final   Chloride 02/01/2024 103  98 - 111 mmol/L Final   CO2 02/01/2024 19 (L)  22 - 32 mmol/L Final   Glucose, Bld 02/01/2024 85  70 - 99 mg/dL Final   BUN 89/88/7974 5 (L)  6 - 20 mg/dL Final   Creatinine, Ser 02/01/2024 0.78  0.61 - 1.24 mg/dL Final   Calcium 89/88/7974 9.2  8.9 - 10.3 mg/dL Final   Total Protein 89/88/7974 6.4 (L)  6.5 - 8.1 g/dL Final   Albumin 89/88/7974 3.5  3.5 - 5.0 g/dL Final   AST 89/88/7974 71 (H)  15 - 41 U/L Final   ALT 02/01/2024 33  0 - 44 U/L Final   Alkaline Phosphatase 02/01/2024 150 (H)  38 - 126 U/L Final   Total Bilirubin 02/01/2024 0.8  0.0 - 1.2 mg/dL Final   GFR, Estimated 02/01/2024 >60  >60 mL/min Final   Anion gap 02/01/2024 18 (H)  5 - 15 Final   Magnesium  02/01/2024 2.1  1.7 - 2.4 mg/dL Final   Ammonia 89/88/7974 43 (H)  9 - 35 umol/L Final   Cholesterol 02/01/2024 637 (H)  0 - 200 mg/dL Final   Triglycerides 89/88/7974 1,519 (H)  <150 mg/dL Final   HDL 89/88/7974 28 (L)  >40 mg/dL Final   Total CHOL/HDL Ratio 02/01/2024 22.5  RATIO Final   VLDL 02/01/2024 304 (H)  0 - 40 mg/dL Final   LDL Cholesterol 02/01/2024 UNABLE TO CALCULATE IF TRIGLYCERIDE OVER 400 mg/dL  0 - 99 mg/dL Final  Hgb A1c MFr Bld 02/01/2024 5.3  4.8 - 5.6 % Final   Mean Plasma Glucose 02/01/2024 105.41  mg/dL Final   TSH 89/88/7974 2.020  0.350 - 4.500 uIU/mL Final   Hepatitis B Surface Ag 02/01/2024 NON REACTIVE  NON REACTIVE Final   HCV Ab 02/01/2024 NON REACTIVE  NON REACTIVE Final   Hep A IgM 02/01/2024 NON REACTIVE  NON REACTIVE Final   Hep B C IgM 02/01/2024 NON REACTIVE  NON REACTIVE Final   HIV 1 RNA Quant 02/01/2024 <20  copies/mL Corrected   LOG10 HIV-1 RNA 02/01/2024 UNABLE TO CALCULATE  log10copy/mL Final   RPR Ser Ql 02/01/2024 NON REACTIVE  NON REACTIVE Final   Vit D, 25-Hydroxy 02/01/2024 9.62 (L)  30 - 100 ng/mL Final   Vitamin B-12 02/01/2024 221  180 - 914 pg/mL Final   [facility] test  code 02/01/2024 [id number]   Final   [facility] test name 02/01/2024 DIRLDL   Final   Source ([facility]) 02/01/2024 PLASMA   Final   Misc [facility] result 02/01/2024 COMMENT   Final   Sodium 02/06/2024 142  135 - 145 mmol/L Final   Potassium 02/06/2024 4.5  3.5 - 5.1 mmol/L Final   Chloride 02/06/2024 105  98 - 111 mmol/L Final   CO2 02/06/2024 26  22 - 32 mmol/L Final   Glucose, Bld 02/06/2024 109 (H)  70 - 99 mg/dL Final   BUN 89/83/7974 12  6 - 20 mg/dL Final   Creatinine, Ser 02/06/2024 0.77  0.61 - 1.24 mg/dL Final   Calcium 89/83/7974 9.6  8.9 - 10.3 mg/dL Final   Total Protein 89/83/7974 6.7  6.5 - 8.1 g/dL Final   Albumin 89/83/7974 3.7  3.5 - 5.0 g/dL Final   AST 89/83/7974 45 (H)  15 - 41 U/L Final   ALT 02/06/2024 25  0 - 44 U/L Final   Alkaline Phosphatase 02/06/2024 124  38 - 126 U/L Final   Total Bilirubin 02/06/2024 0.5  0.0 - 1.2 mg/dL Final   GFR, Estimated 02/06/2024 >60  >60 mL/min Final   Anion gap 02/06/2024 10  5 - 15 Final   Cholesterol 02/06/2024 434 (H)  0 - 200 mg/dL Final   Triglycerides 89/83/7974 537 (H)  <150 mg/dL Final   HDL 89/83/7974 50  >40 mg/dL Final   Total CHOL/HDL Ratio 02/06/2024 8.6  RATIO Final   VLDL 02/06/2024 107 (H)  0 - 40 mg/dL Final   LDL Cholesterol 02/06/2024 UNABLE TO CALCULATE IF TRIGLYCERIDE OVER 400 mg/dL  0 - 99 mg/dL Final   Ammonia 89/83/7974 37 (H)  9 - 35 umol/L Final   [facility] test code 02/06/2024 [id number]   Final   [facility] test name 02/06/2024 DIRECT LDL   Final   Source ([facility]) 02/06/2024 PLASMA LT GREEN TUBE   Final   Misc [facility] result 02/06/2024 COMMENT   Final     Vitals: Blood pressure 131/84, pulse 99, temperature 98 F (36.7 C), temperature source Oral, resp. rate 16, height 5' 9 (1.753 m), weight 104.4 kg, SpO2 97%.    Oliva DELENA Salmon, DO

## 2024-02-09 NOTE — Group Note (Signed)
 Date:  02/09/2024 Time:  1:05 PM  Group Topic/Focus:  Emotional Education:  The group explored themes of acceptance, change, resilience, and control through song analysis and a lyric substitution activity. The goal of the group is to increase self awareness, emotional expression, autonomy, and self-esteem.   Participation Level:  Active  Participation Quality:  Sharing  Affect:  Depressed  Cognitive:  Appropriate  Insight: Appropriate  Engagement in Group:  Engaged  Modes of Intervention:  Activity and Discussion  Additional Comments:    Shaqueena Mauceri D Cotina Freedman 02/09/2024, 1:05 PM

## 2024-02-09 NOTE — Hospital Course (Signed)
 Upon admission the patient was started on a chlordiazepoxide taper for alcohol withdrawal. He was also continued on gabapentin  and quetiapine, and mirtazapine was started for anxiety and insomnia. Early in his stay he experienced mild withdrawal symptoms but these were well controlled. CIWA scores remained low and he did not require significant PRN benzodiazepines after the initial days.  Throughout his hospitalization his mood and anxiety symptoms fluctuated, often correlating with concerns about aftercare and interpersonal stressors. He consistently expressed motivation for abstinence and engagement in a 28 day residential program. He was actively involved in group and the milieu, and his engagement improved as his withdrawal symptoms abated.  Further medication adjustments were made as withdrawal subsided. Quetiapine was increased to 300 mg qhs with 25 mg BID for mood stabilization and anxiety. Gabapentin  was increased to 400 mg TID for anxiety, cravings, and chronic pain. Mirtazapine was discontinued at the patient's request due to increased appetite. Librium was tapered and discontinued as he cleared the withdrawal window.  He was ultimately accepted to [facility] with a scheduled transfer date of 02/11/2024. Discharge planning focused on ensuring a safe transition to [facility], with ongoing medical monitoring for withdrawal and stabilization of mood and anxiety symptoms.  Initial labs were notable for transaminitis and severe hyperlipidemia (cholesterol 637 mg/dL, triglycerides 8,480 mg/dL). Ammonia was mildly elevated. Repeat labs showed improvement in LFTs and ammonia, but persistent hyperlipidemia.

## 2024-02-09 NOTE — Group Note (Signed)
 Date:  02/09/2024 Time:  6:52 PM  Group Topic/Focus:  Making Healthy Choices:   The focus of this group is to help patients identify negative/unhealthy choices they were using prior to admission and identify positive/healthier coping strategies to replace them upon discharge. Relapse Prevention Planning:   The focus of this group is to define relapse and discuss the need for planning to combat relapse.  Watched video of addiction behavior and it's triggers and possible solutions. Gave handouts with description of video and thought provoking questons. Personal sharing and audience sharing.    Participation Level:  Active  Participation Quality:  Appropriate  Affect:  Appropriate  Cognitive:  Appropriate  Insight: Appropriate  Engagement in Group:  Engaged  Modes of Intervention:  Discussion and Education  Additional Comments:    Juliene CHRISTELLA Huddle 02/09/2024, 6:52 PM

## 2024-02-09 NOTE — [facility] Group Notes (Signed)
 [facility] Group Notes:  (Nursing/MHT/Case Management/Adjunct)  Date:  02/09/2024  Time:  8:49 PM  Type of Therapy:  Group Therapy  Participation Level:  Active  Participation Quality:  Appropriate  Affect:  Appropriate  Cognitive:  Alert and Appropriate  Insight:  Appropriate and Good  Engagement in Group:  Supportive  Modes of Intervention:  Socialization and Support  Summary of Progress/Problems:  Vernon Allen 02/09/2024, 8:49 PM

## 2024-02-10 ENCOUNTER — Encounter (HOSPITAL_COMMUNITY): Payer: Self-pay

## 2024-02-10 ENCOUNTER — Other Ambulatory Visit (HOSPITAL_COMMUNITY): Payer: Self-pay

## 2024-02-10 MED ORDER — QUETIAPINE FUMARATE 300 MG PO TABS
300.0000 mg | ORAL_TABLET | Freq: Every day | ORAL | 0 refills | Status: DC
  Filled 2024-02-10: qty 30, 30d supply, fill #0

## 2024-02-10 MED ORDER — FOLIC ACID 1 MG PO TABS
1.0000 mg | ORAL_TABLET | Freq: Every day | ORAL | 0 refills | Status: DC
  Filled 2024-02-10: qty 30, 30d supply, fill #0

## 2024-02-10 MED ORDER — QUETIAPINE FUMARATE 25 MG PO TABS
25.0000 mg | ORAL_TABLET | Freq: Two times a day (BID) | ORAL | 0 refills | Status: DC
  Filled 2024-02-10: qty 60, 30d supply, fill #0

## 2024-02-10 MED ORDER — TRAZODONE HCL 50 MG PO TABS
50.0000 mg | ORAL_TABLET | Freq: Every day | ORAL | 0 refills | Status: DC
  Filled 2024-02-10: qty 30, 30d supply, fill #0

## 2024-02-10 MED ORDER — GABAPENTIN 400 MG PO CAPS
400.0000 mg | ORAL_CAPSULE | Freq: Three times a day (TID) | ORAL | 0 refills | Status: DC
  Filled 2024-02-10: qty 90, 30d supply, fill #0

## 2024-02-10 MED ORDER — HYDRALAZINE HCL 25 MG PO TABS
25.0000 mg | ORAL_TABLET | Freq: Three times a day (TID) | ORAL | Status: DC
  Administered 2024-02-10 – 2024-02-11 (×2): 25 mg via ORAL
  Filled 2024-02-10: qty 42
  Filled 2024-02-10 (×2): qty 1

## 2024-02-10 NOTE — Progress Notes (Signed)

## 2024-02-10 NOTE — Progress Notes (Signed)
(  Sleep Hours) - 8.25 hours (Any PRNs that were needed, meds refused, or side effects to meds)-  Melatonin refused (Any disturbances and when (visitation, over night)- None (Concerns raised by the patient)-  None (SI/HI/AVH)-  Denies

## 2024-02-10 NOTE — Progress Notes (Signed)
 [facility] Inpatient Psychiatry Progress Note  Date: 02/10/24 Patient: Vernon Allen MRN: [id number]  Assessment and Plan: Vernon Allen is a 43 y.o. male admitted for symptoms of depression related to chronic ongoing alcohol abuse.   10/19 - Accepted at [facility]. Plan to hold on the unit until he can be directly transferred to [facility] on 10/21.   10/20-stable in terms of mood, affect and behavior.  Patient reports much improved mood and tolerating his medication regimen well.  Will plan to discharge tomorrow.  # Bipolar I disorder, most recent episode depressed (HCC) - Quetiapine 300 mg at bedtime, 25 mg twice daily.   # Alcohol use disorder, severe - Gabapentin  400 mg TID  # Unspecified Anxiety Disorder - Quetiapine as above  Risk Assessment - Low for acute danger to self or others while inpatient. Risk appears to be at least intermediate given passive SI if discharged today without transfer to rehab.  Discharge Planning Barriers to discharge: medical monitoring of alcohol withdrawal Estimated length of stay: until 10/21 Predicted Discharge location: [facility]    Interval History and update: Chart reviewed. No significant events overnight.  He is calm, friendly and interacts well with me.  Behavior and mood are stable.  He reports feeling much better and states that his depression is getting better.  The patient denies any excessive sedation or other side effects from Seroquel.  He continues to deny SI, HI and AVH.  He looks forward to his inpatient substance abuse rehab and states that alcohol has destroyed his life.  The patient looks forward to becoming sober long-term and improving his life.  The patient denies any hopelessness or worthlessness.  Review systems is negative.  Given overall stability, we will plan to discharge tomorrow to residential substance abuse treatment     Physical Exam MSK/Neuro - Normal gait and station  Mental Status  Exam Appearance - Casually dressed, appropriate hygiene and grooming, overweight Attitude - Calm, polite, not guarded Speech - normal volume, prosody, inflection Mood -feeling better Affect - Mildly restricted Thought Process - LLGD Thought Content - No delusional TC expressed SI/HI - Passive  Perceptions - Denies AVH; not RIS Judgement/Insight - Fair, improving Fund of knowledge - WNL Language - No impairments      Lab Results:  Admission on 01/30/2024  Component Date Value Ref Range Status   Sodium 02/01/2024 140  135 - 145 mmol/L Final   Potassium 02/01/2024 3.7  3.5 - 5.1 mmol/L Final   Chloride 02/01/2024 103  98 - 111 mmol/L Final   CO2 02/01/2024 19 (L)  22 - 32 mmol/L Final   Glucose, Bld 02/01/2024 85  70 - 99 mg/dL Final   BUN 89/88/7974 5 (L)  6 - 20 mg/dL Final   Creatinine, Ser 02/01/2024 0.78  0.61 - 1.24 mg/dL Final   Calcium 89/88/7974 9.2  8.9 - 10.3 mg/dL Final   Total Protein 89/88/7974 6.4 (L)  6.5 - 8.1 g/dL Final   Albumin 89/88/7974 3.5  3.5 - 5.0 g/dL Final   AST 89/88/7974 71 (H)  15 - 41 U/L Final   ALT 02/01/2024 33  0 - 44 U/L Final   Alkaline Phosphatase 02/01/2024 150 (H)  38 - 126 U/L Final   Total Bilirubin 02/01/2024 0.8  0.0 - 1.2 mg/dL Final   GFR, Estimated 02/01/2024 >60  >60 mL/min Final   Anion gap 02/01/2024 18 (H)  5 - 15 Final   Magnesium  02/01/2024 2.1  1.7 - 2.4  mg/dL Final   Ammonia 89/88/7974 43 (H)  9 - 35 umol/L Final   Cholesterol 02/01/2024 637 (H)  0 - 200 mg/dL Final   Triglycerides 89/88/7974 1,519 (H)  <150 mg/dL Final   HDL 89/88/7974 28 (L)  >40 mg/dL Final   Total CHOL/HDL Ratio 02/01/2024 22.5  RATIO Final   VLDL 02/01/2024 304 (H)  0 - 40 mg/dL Final   LDL Cholesterol 02/01/2024 UNABLE TO CALCULATE IF TRIGLYCERIDE OVER 400 mg/dL  0 - 99 mg/dL Final   Hgb J8r MFr Bld 02/01/2024 5.3  4.8 - 5.6 % Final   Mean Plasma Glucose 02/01/2024 105.41  mg/dL Final   TSH 89/88/7974 2.020  0.350 - 4.500 uIU/mL Final    Hepatitis B Surface Ag 02/01/2024 NON REACTIVE  NON REACTIVE Final   HCV Ab 02/01/2024 NON REACTIVE  NON REACTIVE Final   Hep A IgM 02/01/2024 NON REACTIVE  NON REACTIVE Final   Hep B C IgM 02/01/2024 NON REACTIVE  NON REACTIVE Final   HIV 1 RNA Quant 02/01/2024 <20  copies/mL Corrected   LOG10 HIV-1 RNA 02/01/2024 UNABLE TO CALCULATE  log10copy/mL Final   RPR Ser Ql 02/01/2024 NON REACTIVE  NON REACTIVE Final   Vit D, 25-Hydroxy 02/01/2024 9.62 (L)  30 - 100 ng/mL Final   Vitamin B-12 02/01/2024 221  180 - 914 pg/mL Final   [facility] test code 02/01/2024 [id number]   Final   [facility] test name 02/01/2024 DIRLDL   Final   Source ([facility]) 02/01/2024 PLASMA   Final   Misc [facility] result 02/01/2024 COMMENT   Final   Sodium 02/06/2024 142  135 - 145 mmol/L Final   Potassium 02/06/2024 4.5  3.5 - 5.1 mmol/L Final   Chloride 02/06/2024 105  98 - 111 mmol/L Final   CO2 02/06/2024 26  22 - 32 mmol/L Final   Glucose, Bld 02/06/2024 109 (H)  70 - 99 mg/dL Final   BUN 89/83/7974 12  6 - 20 mg/dL Final   Creatinine, Ser 02/06/2024 0.77  0.61 - 1.24 mg/dL Final   Calcium 89/83/7974 9.6  8.9 - 10.3 mg/dL Final   Total Protein 89/83/7974 6.7  6.5 - 8.1 g/dL Final   Albumin 89/83/7974 3.7  3.5 - 5.0 g/dL Final   AST 89/83/7974 45 (H)  15 - 41 U/L Final   ALT 02/06/2024 25  0 - 44 U/L Final   Alkaline Phosphatase 02/06/2024 124  38 - 126 U/L Final   Total Bilirubin 02/06/2024 0.5  0.0 - 1.2 mg/dL Final   GFR, Estimated 02/06/2024 >60  >60 mL/min Final   Anion gap 02/06/2024 10  5 - 15 Final   Cholesterol 02/06/2024 434 (H)  0 - 200 mg/dL Final   Triglycerides 89/83/7974 537 (H)  <150 mg/dL Final   HDL 89/83/7974 50  >40 mg/dL Final   Total CHOL/HDL Ratio 02/06/2024 8.6  RATIO Final   VLDL 02/06/2024 107 (H)  0 - 40 mg/dL Final   LDL Cholesterol 02/06/2024 UNABLE TO CALCULATE IF TRIGLYCERIDE OVER 400 mg/dL  0 - 99 mg/dL Final   Ammonia 89/83/7974 37 (H)  9 - 35 umol/L Final   [facility] test code 02/06/2024  [id number]   Final   [facility] test name 02/06/2024 DIRECT LDL   Final   Source ([facility]) 02/06/2024 PLASMA LT GREEN TUBE   Final   Misc [facility] result 02/06/2024 COMMENT   Final     Vitals: Blood pressure (!) 146/92, pulse (!) 104, temperature 98 F (36.7 C), temperature  source Oral, resp. rate 16, height 5' 9 (1.753 m), weight 104.4 kg, SpO2 97%.    Lamar Handler Jama Slain, DO

## 2024-02-10 NOTE — Group Note (Signed)
 Date:  02/10/2024 Time:  3:40 PM  Group Topic/Focus: Occupational Therapy Group Occupational Therapy    Participation Level:  Did Not Attend  Vernon Allen 02/10/2024, 3:40 PM

## 2024-02-10 NOTE — Group Note (Signed)
 Date:  02/10/2024 Time:  1:20 PM  Group Topic/Focus:   Chaplin group  Participation Level:  Did Not Attend  Vernon Allen 02/10/2024, 1:20 PM

## 2024-02-10 NOTE — Group Note (Signed)
 Date:  02/10/2024 Time:  9:54 AM  Group Topic/Focus:  Goals Group:   The focus of this group is to help patients establish daily goals to achieve during treatment and discuss how the patient can incorporate goal setting into their daily lives to aide in recovery.    Participation Level:  Active  Participation Quality:  Appropriate  Affect:  Appropriate  Cognitive:  Alert  Insight: Appropriate  Engagement in Group:  Engaged  Modes of Intervention:  Orientation  Additional Comments:  Vernon Allen has attended group and he wants to be able to feel not so humiliated to be able to continue to be a man  [employer] 02/10/2024, 9:54 AM

## 2024-02-10 NOTE — Group Note (Signed)
 Date:  02/10/2024 Time:  10:47 AM  Group Topic/Focus:  wellness group     Participation Level:  Active  Additional Comments:  attended group  [employer] 02/10/2024, 10:47 AM

## 2024-02-10 NOTE — Plan of Care (Signed)
   Problem: Education: Goal: Emotional status will improve Outcome: Progressing   Problem: Education: Goal: Mental status will improve Outcome: Progressing

## 2024-02-10 NOTE — Group Note (Signed)
 Occupational Therapy Group Note  Group Topic: Sleep Hygiene  Group Date: 02/10/2024 Start Time: 1500 End Time: 1538 Facilitators: Dot Dallas MATSU, OT   Group Description: Group encouraged increased participation and engagement through topic focused on sleep hygiene. Patients reflected on the quality of sleep they typically receive and identified areas that need improvement. Group was given background information on sleep and sleep hygiene, including common sleep disorders. Group members also received information on how to improve one's sleep and introduced a sleep diary as a tool that can be utilized to track sleep quality over a length of time. Group session ended with patients identifying one or more strategies they could utilize or implement into their sleep routine in order to improve overall sleep quality.        Therapeutic Goal(s):  Identify one or more strategies to improve overall sleep hygiene  Identify one or more areas of sleep that are negatively impacted (sleep too much, too little, etc)     Participation Level: Engaged   Participation Quality: Independent   Behavior: Appropriate   Speech/Thought Process: Relevant   Affect/Mood: Appropriate   Insight: Fair   Judgement: Fair      Modes of Intervention: Education  Patient Response to Interventions:  Attentive   Plan: Continue to engage patient in OT groups 2 - 3x/week.  02/10/2024  [place] MATSU Dot, OT   Amber Guthridge, OT

## 2024-02-10 NOTE — Progress Notes (Signed)
   02/10/24 0800  Psych Admission Type (Psych Patients Only)  Admission Status Voluntary  Psychosocial Assessment  Patient Complaints Substance abuse  Eye Contact Fair  Facial Expression Animated  Affect Appropriate to circumstance  Speech Logical/coherent  Interaction Assertive  Motor Activity Fidgety  Appearance/Hygiene Unremarkable  Behavior Characteristics Appropriate to situation  Mood Pleasant  Thought Process  Coherency WDL  Content WDL  Delusions None reported or observed  Perception WDL  Hallucination None reported or observed  Judgment WDL  Confusion None  Danger to Self  Current suicidal ideation? Denies  Agreement Not to Harm Self Yes  Description of Agreement Verbal

## 2024-02-10 NOTE — Plan of Care (Signed)
  Problem: Education: Goal: Knowledge of [facility] Education information/materials will improve Outcome: Progressing Goal: Emotional status will improve Outcome: Progressing Goal: Mental status will improve Outcome: Progressing Goal: Verbalization of understanding the information provided will improve Outcome: Progressing   Problem: Activity: Goal: Interest or engagement in activities will improve Outcome: Progressing Goal: Sleeping patterns will improve Outcome: Progressing   Problem: Coping: Goal: Ability to verbalize frustrations and anger appropriately will improve Outcome: Progressing Goal: Ability to demonstrate self-control will improve Outcome: Progressing   Problem: Health Behavior/Discharge Planning: Goal: Identification of resources available to assist in meeting health care needs will improve Outcome: Progressing Goal: Compliance with treatment plan for underlying cause of condition will improve Outcome: Progressing   Problem: Physical Regulation: Goal: Ability to maintain clinical measurements within normal limits will improve Outcome: Progressing   Problem: Safety: Goal: Periods of time without injury will increase Outcome: Progressing   Problem: Education: Goal: Knowledge of disease or condition will improve Outcome: Progressing Goal: Understanding of discharge needs will improve Outcome: Progressing   Problem: Health Behavior/Discharge Planning: Goal: Ability to identify changes in lifestyle to reduce recurrence of condition will improve Outcome: Progressing Goal: Identification of resources available to assist in meeting health care needs will improve Outcome: Progressing   Problem: Physical Regulation: Goal: Complications related to the disease process, condition or treatment will be avoided or minimized Outcome: Progressing   Problem: Safety: Goal: Ability to remain free from injury will improve Outcome: Progressing   Problem:  Education: Goal: Knowledge of disease or condition will improve Outcome: Progressing Goal: Understanding of discharge needs will improve Outcome: Progressing   Problem: Health Behavior/Discharge Planning: Goal: Ability to identify changes in lifestyle to reduce recurrence of condition will improve Outcome: Progressing Goal: Identification of resources available to assist in meeting health care needs will improve Outcome: Progressing   Problem: Physical Regulation: Goal: Complications related to the disease process, condition or treatment will be avoided or minimized Outcome: Progressing   Problem: Safety: Goal: Ability to remain free from injury will improve Outcome: Progressing   Problem: Education: Goal: Ability to make informed decisions regarding treatment will improve Outcome: Progressing   Problem: Coping: Goal: Coping ability will improve Outcome: Progressing   Problem: Health Behavior/Discharge Planning: Goal: Identification of resources available to assist in meeting health care needs will improve Outcome: Progressing   Problem: Medication: Goal: Compliance with prescribed medication regimen will improve Outcome: Progressing   Problem: Self-Concept: Goal: Ability to disclose and discuss suicidal ideas will improve Outcome: Progressing

## 2024-02-10 NOTE — Progress Notes (Signed)
 CONTACT NOTE:  [facility] [phone]   CSW spoke with admissions from [facility] who confirmed patient can stay in lobby until time of assessment at 12:30 PM. Patient will be discharging from [facility] at 11:00 AM via taxi.  SIGNED: Tavarus Poteete Nunez-Uva, LCSW-A

## 2024-02-10 NOTE — Group Note (Signed)
 Recreation Therapy Group Note   Group Topic:Problem Solving  Group Date: 02/10/2024 Start Time: 0941 End Time: 1002 Facilitators: Tyah Acord-McCall, LRT,CTRS Location: [address] Dayroom   Group Topic: Communication, Team Building, Problem Solving  Goal Area(s) Addresses:  Patient will effectively work with peer towards shared goal.  Patient will identify skills used to make activity successful.  Patient will identify how skills used during activity can be used to reach post d/c goals.   Behavioral Response: Engaged  Intervention: STEM Activity  Activity: Straw Bridge. In teams of 3-5, patients were given 15 plastic drinking straws and an equal length of masking tape. Using the materials provided, patients were instructed to build a free standing bridge-like structure to suspend an everyday item (ex: puzzle box) off of the floor or table surface. All materials were required to be used by the team in their design. LRT facilitated post-activity discussion reviewing team process. Patients were encouraged to reflect how the skills used in this activity can be generalized to daily life post discharge.   Education: Pharmacist, community, Scientist, physiological, Discharge Planning   Education Outcome: Acknowledges education/In group clarification offered/Needs additional education.    Affect/Mood: Appropriate   Participation Level: Engaged   Participation Quality: Independent   Behavior: Appropriate   Speech/Thought Process: Focused   Insight: Good   Judgement: Good   Modes of Intervention: STEM Activity   Patient Response to Interventions:  Engaged   Education Outcome:  In group clarification offered    Clinical Observations/Individualized Feedback: Pt was focused in the construction of the bridge. Pt came of with the main concept of the bridge his group built. Pt was attentive to the suggestions offered by peers and worked with them implementing those ideas into the construction of  the structure.    Plan: Continue to engage patient in RT group sessions 2-3x/week.   Briannia Laba-McCall, LRT,CTRS 02/10/2024 11:17 AM

## 2024-02-10 NOTE — [facility] Group Notes (Signed)
 [facility] Group Notes:  (Nursing/MHT/Case Management/Adjunct)  Date:  02/10/2024  Time:  10:24 PM  Type of Therapy:  Psychoeducational Skills  Participation Level:  Active  Participation Quality:  Appropriate  Affect:  Appropriate  Cognitive:  Appropriate  Insight:  Good  Engagement in Group:  Engaged  Modes of Intervention:  Education  Summary of Progress/Problems: The patient rated his day as a 6.5 out of a possible 10 due to having anxiety. He expressed in group that he is anxious about being discharged tomorrow and that he is concerned about his short term disability coverage with his employer. His goal for today was to not judge which he did not accomplish.   Kiarah Eckstein S 02/10/2024, 10:24 PM

## 2024-02-10 NOTE — BH IP Treatment Plan (Signed)
 Interdisciplinary Treatment and Diagnostic Plan Update  02/10/2024 Time of Session: 9:25AM - UPDATE SALEH ULBRICH MRN: [id number]  Principal Diagnosis: Bipolar I disorder, most recent episode depressed (HCC)  Secondary Diagnoses: Principal Problem:   Bipolar I disorder, most recent episode depressed (HCC) Active Problems:   Arthritis   EtOH dependence (HCC)   Major depressive disorder, recurrent severe without psychotic features (HCC)   Substance induced mood disorder (HCC)   Bipolar I disorder, single manic episode, severe, with psychosis (HCC)   Alcohol use disorder   Tobacco use disorder   Chronic back pain   Increased ammonia level   Vitamin D deficiency   Current Medications:  Current Facility-Administered Medications  Medication Dose Route Frequency Provider Last Rate Last Admin   acetaminophen  (TYLENOL ) tablet 650 mg  650 mg Oral Q6H PRN Smith, Annie B, NP   650 mg at 02/08/24 1517   alum & mag hydroxide-simeth (MAALOX/MYLANTA) 200-200-20 MG/5ML suspension 30 mL  30 mL Oral Q4H PRN Smith, Annie B, NP       haloperidol  (HALDOL ) tablet 5 mg  5 mg Oral TID PRN Smith, Annie B, NP       And   diphenhydrAMINE  (BENADRYL ) capsule 50 mg  50 mg Oral TID PRN Smith, Annie B, NP       haloperidol  lactate (HALDOL ) injection 5 mg  5 mg Intramuscular TID PRN Smith, Annie B, NP       And   diphenhydrAMINE  (BENADRYL ) injection 50 mg  50 mg Intramuscular TID PRN Smith, Annie B, NP       And   LORazepam  (ATIVAN ) injection 2 mg  2 mg Intramuscular TID PRN Smith, Annie B, NP       haloperidol  lactate (HALDOL ) injection 10 mg  10 mg Intramuscular TID PRN Smith, Annie B, NP       And   diphenhydrAMINE  (BENADRYL ) injection 50 mg  50 mg Intramuscular TID PRN Smith, Annie B, NP       And   LORazepam  (ATIVAN ) injection 2 mg  2 mg Intramuscular TID PRN Smith, Annie B, NP       EPINEPHrine  (EPI-PEN) injection 0.3 mg  0.3 mg Intramuscular PRN Trudy Carwin, NP       feeding supplement (ENSURE  PLUS HIGH PROTEIN) liquid 237 mL  237 mL Oral BID BM McLauchlin, Angela, NP   237 mL at 02/09/24 1340   folic acid (FOLVITE) tablet 1 mg  1 mg Oral Daily Smith, Annie B, NP   1 mg at 02/10/24 9258   gabapentin  (NEURONTIN ) capsule 400 mg  400 mg Oral TID Prentis Kitchens A, DO   400 mg at 02/10/24 1136   magnesium  hydroxide (MILK OF MAGNESIA) suspension 30 mL  30 mL Oral Daily PRN Smith, Annie B, NP       melatonin tablet 5 mg  5 mg Oral QHS Hill, Corean Massa, MD   5 mg at 02/07/24 2109   multivitamin with minerals tablet 1 tablet  1 tablet Oral Daily Smith, Annie B, NP   1 tablet at 02/10/24 0741   ondansetron  (ZOFRAN ) tablet 4 mg  4 mg Oral Q8H PRN Smith, Annie B, NP   4 mg at 01/30/24 2118   pantoprazole  (PROTONIX ) EC tablet 40 mg  40 mg Oral Daily Leigh Corean Massa, MD   40 mg at 02/10/24 0741   QUEtiapine (SEROQUEL) tablet 25 mg  25 mg Oral BID Prentis Kitchens A, DO   25 mg at 02/10/24 0740  QUEtiapine (SEROQUEL) tablet 300 mg  300 mg Oral QHS Prentis Kitchens A, DO   300 mg at 02/09/24 2044   thiamine  (Vitamin B-1) tablet 100 mg  100 mg Oral Daily Smith, Annie B, NP   100 mg at 02/10/24 9258   Or   thiamine  (VITAMIN B1) injection 100 mg  100 mg Intravenous Daily Smith, Annie B, NP       traZODone  (DESYREL ) tablet 50 mg  50 mg Oral QHS McLauchlin, Angela, NP   50 mg at 02/09/24 2044   Vitamin D (Ergocalciferol) (DRISDOL) 1.25 MG (50000 UNIT) capsule 50,000 Units  50,000 Units Oral Q7 days Leigh Corean Massa, MD   50,000 Units at 02/08/24 1716   PTA Medications: Medications Prior to Admission  Medication Sig Dispense Refill Last Dose/Taking   ARIPiprazole  (ABILIFY ) 5 MG tablet Take 1 tablet (5 mg total) by mouth daily. (Patient not taking: Reported on 01/30/2024) 30 tablet 0    cyclobenzaprine  (FLEXERIL ) 5 MG tablet Take 1 tablet (5 mg total) by mouth 3 (three) times daily as needed. (Patient not taking: Reported on 01/30/2024) 30 tablet 0    DULoxetine  (CYMBALTA ) 20 MG capsule Take 1  capsule (20 mg total) by mouth at bedtime. (Patient not taking: Reported on 01/30/2024) 30 capsule 0    folic acid (FOLVITE) 1 MG tablet Take 1 tablet (1 mg total) by mouth daily. 30 tablet 0    gabapentin  (NEURONTIN ) 400 MG capsule Take 1 capsule (400 mg total) by mouth 3 (three) times daily. 90 capsule 0    melatonin 5 MG TABS Take 1 tablet (5 mg total) by mouth at bedtime. 30 tablet 0    oxyCODONE  (OXY IR/ROXICODONE ) 5 MG immediate release tablet Take 1-2 tablets (5-10 mg total) by mouth every 4 (four) hours as needed for moderate pain (pain score 4-6). (Patient not taking: Reported on 01/30/2024) 40 tablet 0    QUEtiapine (SEROQUEL) 25 MG tablet Take 1 tablet (25 mg total) by mouth 2 (two) times daily. 60 tablet 0    QUEtiapine (SEROQUEL) 300 MG tablet Take 1 tablet (300 mg total) by mouth at bedtime. 30 tablet 0    topiramate  (TOPAMAX ) 50 MG tablet Take 1 tablet (50 mg total) by mouth daily. (Patient not taking: Reported on 01/30/2024) 30 tablet 0    traZODone  (DESYREL ) 50 MG tablet Take 1 tablet (50 mg total) by mouth at bedtime. 30 tablet 0     Patient Stressors: Medication change or noncompliance   Substance abuse    Patient Strengths: Careers information officer for treatment/growth  Supportive family/friends   Treatment Modalities: Medication Management, Group therapy, Case management,  1 to 1 session with clinician, Psychoeducation, Recreational therapy.   Physician Treatment Plan for Primary Diagnosis: Bipolar I disorder, most recent episode depressed (HCC) Long Term Goal(s): Improvement in symptoms so as ready for discharge   Short Term Goals: Ability to identify changes in lifestyle to reduce recurrence of condition will improve Ability to verbalize feelings will improve Ability to disclose and discuss suicidal ideas Ability to demonstrate self-control will improve Ability to identify and develop effective coping behaviors will improve Ability  to maintain clinical measurements within normal limits will improve Compliance with prescribed medications will improve Ability to identify triggers associated with substance abuse/mental health issues will improve  Medication Management: Evaluate patient's response, side effects, and tolerance of medication regimen.  Therapeutic Interventions: 1 to 1 sessions, Unit Group sessions and Medication administration.  Evaluation of  Outcomes: Progressing  Physician Treatment Plan for Secondary Diagnosis: Principal Problem:   Bipolar I disorder, most recent episode depressed (HCC) Active Problems:   Arthritis   EtOH dependence (HCC)   Major depressive disorder, recurrent severe without psychotic features (HCC)   Substance induced mood disorder (HCC)   Bipolar I disorder, single manic episode, severe, with psychosis (HCC)   Alcohol use disorder   Tobacco use disorder   Chronic back pain   Increased ammonia level   Vitamin D deficiency  Long Term Goal(s): Improvement in symptoms so as ready for discharge   Short Term Goals: Ability to identify changes in lifestyle to reduce recurrence of condition will improve Ability to verbalize feelings will improve Ability to disclose and discuss suicidal ideas Ability to demonstrate self-control will improve Ability to identify and develop effective coping behaviors will improve Ability to maintain clinical measurements within normal limits will improve Compliance with prescribed medications will improve Ability to identify triggers associated with substance abuse/mental health issues will improve     Medication Management: Evaluate patient's response, side effects, and tolerance of medication regimen.  Therapeutic Interventions: 1 to 1 sessions, Unit Group sessions and Medication administration.  Evaluation of Outcomes: Progressing   RN Treatment Plan for Primary Diagnosis: Bipolar I disorder, most recent episode depressed (HCC) Long Term  Goal(s): Knowledge of disease and therapeutic regimen to maintain health will improve  Short Term Goals: Ability to remain free from injury will improve, Ability to verbalize frustration and anger appropriately will improve, Ability to demonstrate self-control, Ability to participate in decision making will improve, and Ability to verbalize feelings will improve  Medication Management: RN will administer medications as ordered by provider, will assess and evaluate patient's response and provide education to patient for prescribed medication. RN will report any adverse and/or side effects to prescribing provider.  Therapeutic Interventions: 1 on 1 counseling sessions, Psychoeducation, Medication administration, Evaluate responses to treatment, Monitor vital signs and CBGs as ordered, Perform/monitor CIWA, COWS, AIMS and Fall Risk screenings as ordered, Perform wound care treatments as ordered.  Evaluation of Outcomes: Progressing   LCSW Treatment Plan for Primary Diagnosis: Bipolar I disorder, most recent episode depressed (HCC) Long Term Goal(s): Safe transition to appropriate next level of care at discharge, Engage patient in therapeutic group addressing interpersonal concerns.  Short Term Goals: Engage patient in aftercare planning with referrals and resources, Increase social support, Increase ability to appropriately verbalize feelings, Increase emotional regulation, Facilitate acceptance of mental health diagnosis and concerns, and Facilitate patient progression through stages of change regarding substance use diagnoses and concerns  Therapeutic Interventions: Assess for all discharge needs, 1 to 1 time with Social worker, Explore available resources and support systems, Assess for adequacy in community support network, Educate family and significant other(s) on suicide prevention, Complete Psychosocial Assessment, Interpersonal group therapy.  Evaluation of Outcomes: Progressing   Progress  in Treatment: Attending groups: No. Participating in groups: No. Taking medication as prescribed: Yes. Toleration medication: Yes. Family/Significant other contact made: No, will contact:  Burnard Right, significant other [phone] (lives together) OR Mom Andres [phone] Patient understands diagnosis: Yes. Discussing patient identified problems/goals with staff: Yes. Medical problems stabilized or resolved: Yes. Denies suicidal/homicidal ideation: Yes. Issues/concerns per patient self-inventory: No.   New problem(s) identified:  No   New Short Term/Long Term Goal(s):  detox, medication management for mood stabilization; elimination of SI thoughts; development of comprehensive mental wellness/sobriety plan     Patient Goals:  I'm trying to detox from alcohol and start  taking my bipolar mediations.  I feel depressed, and it's harder to face people.  I want to get myself right so I can be there for my family.  I have a home to go to, and I'm trying to keep my job.     Discharge Plan or Barriers:  Patient recently admitted. CSW will continue to follow and assess for appropriate referrals and possible discharge planning.      Reason for Continuation of Hospitalization: Depression Medication stabilization   Estimated Length of Stay:  5 - 7 days  Last 3 [place] Suicide Severity Risk Score: Flowsheet Row [facility] (Current[facility] Most recent reading at 01/30/2024  8:36 PM ED from 01/30/2024 in [facility] Emergency Department at [facility] Most recent reading at 01/30/2024  1:38 PM ED from 01/08/2024 in [facility] Emergency Department at [facility] Most recent reading at 01/08/2024  9:52 AM  C-SSRS RISK CATEGORY Low Risk Low Risk No Risk    Last PHQ 2/9 Scores:    06/14/2023    3:21 PM 03/02/2022    2:07 PM  Depression screen PHQ 2/9  Decreased Interest 0 0  Down, Depressed, Hopeless 0 0  PHQ - 2 Score 0  0    Scribe for Treatment Team: Lashannon Bresnan M Layken Beg, ISRAEL 02/10/2024 2:01 PM

## 2024-02-11 DIAGNOSIS — F1090 Alcohol use, unspecified, uncomplicated: Secondary | ICD-10-CM

## 2024-02-11 DIAGNOSIS — E7229 Other disorders of urea cycle metabolism: Secondary | ICD-10-CM

## 2024-02-11 MED ORDER — FOLIC ACID 1 MG PO TABS: 1.0000 mg | ORAL_TABLET | Freq: Every day | ORAL | 0 refills | Status: AC

## 2024-02-11 MED ORDER — GABAPENTIN 400 MG PO CAPS
400.0000 mg | ORAL_CAPSULE | Freq: Three times a day (TID) | ORAL | Status: AC
Start: 2024-02-11 — End: 2024-03-16

## 2024-02-11 MED ORDER — ADULT MULTIVITAMIN W/MINERALS CH: 1.0000 | ORAL_TABLET | Freq: Every day | ORAL | 0 refills | Status: AC

## 2024-02-11 MED ORDER — QUETIAPINE FUMARATE 300 MG PO TABS
300.0000 mg | ORAL_TABLET | Freq: Every day | ORAL | Status: DC
Start: 2024-02-11 — End: 2024-03-16

## 2024-02-11 MED ORDER — QUETIAPINE FUMARATE 25 MG PO TABS: 25.0000 mg | ORAL_TABLET | Freq: Two times a day (BID) | ORAL | Status: DC

## 2024-02-11 MED ORDER — HYDRALAZINE HCL 25 MG PO TABS
25.0000 mg | ORAL_TABLET | Freq: Three times a day (TID) | ORAL | Status: AC
Start: 2024-02-11 — End: 2024-03-16

## 2024-02-11 MED ORDER — VITAMIN D (ERGOCALCIFEROL) 1.25 MG (50000 UNIT) PO CAPS: 50000.0000 [IU] | ORAL_CAPSULE | ORAL | 0 refills | Status: AC

## 2024-02-11 NOTE — Transportation (Signed)
 02/11/2024  Vernon Allen DOB: 1980-06-06 MRN: [id number]   RIDER WAIVER AND RELEASE OF LIABILITY  For the purposes of helping with transportation needs, [facility] partners with outside transportation providers (taxi companies, [employer], Catering manager.) to give [facility] patients or other approved people the choice of on-demand rides Public librarian) to our buildings for non-emergency visits.  By using [employer], I, the person signing this document, on behalf of myself and/or any legal minors (in my care using the [employer]), agree:  Science writer given to me are supplied by independent, outside transportation providers who do not work for, or have any affiliation with, [employer]. Gas is not a transportation company. [facility] has no control over the quality or safety of the rides I get using [employer]. [facility] has no control over whether any outside ride will happen on time or not. [facility] gives no guarantee on the reliability, quality, safety, or availability on any rides, or that no mistakes will happen. I know and accept that traveling by vehicle (car, truck, SVU, fleeta, bus, taxi, etc.) has risks of serious injuries such as disability, being paralyzed, and death. I know and agree the risk of using [employer] is mine alone, and not [employer]. [employer] are provided as is and as are available. The transportation providers are in charge for all inspections and care of the vehicles used to provide these rides. I agree not to take legal action against [facility], its agents, employees, officers, directors, representatives, insurers, attorneys, assigns, successors, subsidiaries, and affiliates at any time for any reasons related directly or indirectly to using [employer]. I also agree not to take legal action against [facility] or its affiliates for any injury, death, or damage to property caused by or related to  using [employer]. I have read this Waiver and Release of Liability, and I understand the terms used in it and their legal meaning. This Waiver is freely and voluntarily given with the understanding that my right (or any legal minors) to legal action against [facility] relating to [employer] is knowingly given up to use these services.   I attest that I read the Ride Waiver and Release of Liability to Vernon Allen, gave Mr. Gunnells the opportunity to ask questions and answered the questions asked (if any). I affirm that Darnel L Leisure then provided consent for assistance with transportation.

## 2024-02-11 NOTE — Plan of Care (Signed)
   Problem: Education: Goal: Emotional status will improve Outcome: Progressing Goal: Mental status will improve Outcome: Progressing   Problem: Activity: Goal: Interest or engagement in activities will improve Outcome: Progressing

## 2024-02-11 NOTE — [facility]HH Suicide Risk Assessment (Signed)
 [facility] Discharge Suicide Risk Assessment   Principal Problem: Bipolar I disorder, most recent episode depressed (HCC) Discharge Diagnoses: Principal Problem:   Bipolar I disorder, most recent episode depressed (HCC) Active Problems:   Substance induced mood disorder (HCC)   Alcohol use disorder   Tobacco use disorder   Increased ammonia level   Vitamin D deficiency   Total Time spent with patient: 30 minutes  Musculoskeletal: Strength & Muscle Tone: within normal limits Gait & Station: normal Patient leans: N/A  Psychiatric Specialty Exam  Presentation  General Appearance:  Appropriate for Environment  Eye Contact: Good  Speech: Clear and Coherent  Speech Volume: Normal  Handedness: Right   Mood and Affect  Mood: Euthymic  Duration of Depression Symptoms: Greater than two weeks  Affect: Appropriate   Thought Process  Thought Processes: Coherent  Descriptions of Associations:Intact  Orientation:Full (Time, Place and Person)  Thought Content:Abstract Reasoning; Logical  History of Schizophrenia/Schizoaffective disorder:No  Duration of Psychotic Symptoms:Greater than six months  Hallucinations:Hallucinations: None  Ideas of Reference:None  Suicidal Thoughts:Suicidal Thoughts: No  Homicidal Thoughts:Homicidal Thoughts: No   Sensorium  Memory: Immediate Good; Remote Good; Recent Good  Judgment: Good  Insight: Good   Executive Functions  Concentration: Good  Attention Span: Good  Recall: Good  Fund of Knowledge: Good  Language: Good   Psychomotor Activity  Psychomotor Activity: Psychomotor Activity: Normal   Assets  Assets: Communication Skills; Financial Resources/Insurance; Desire for Improvement; Housing; Vocational/Educational   Sleep  Sleep: Sleep: Good  Estimated Sleeping Duration (Last 24 Hours): 7.75-8.50 hours  Physical Exam: Physical Exam ROS Blood pressure 111/83, pulse 94, temperature (!) 97.5  F (36.4 C), temperature source Oral, resp. rate 16, height 5' 9 (1.753 m), weight 104.4 kg, SpO2 97%. Body mass index is 33.99 kg/m.  Mental Status Per Nursing Assessment::   On Admission:  Suicidal ideation indicated by patient  Demographic Factors:  Male, Divorced or widowed, Caucasian, and Living alone  Loss Factors: Decrease in vocational status, Decline in physical health, and Financial problems/change in socioeconomic status  Historical Factors: Prior suicide attempts and Impulsivity  Risk Reduction Factors:   Sense of responsibility to family, Employed, Positive social support, and Positive coping skills or problem solving skills  Continued Clinical Symptoms:  Severe Anxiety and/or Agitation Bipolar Disorder:   Depressive phase Alcohol/Substance Abuse/Dependencies Chronic Pain Medical Diagnoses and Treatments/Surgeries  Cognitive Features That Contribute To Risk:  None    Suicide Risk:  Mild:  Suicidal ideation of limited frequency, intensity, duration, and specificity.  There are no identifiable plans, no associated intent, mild dysphoria and related symptoms, good self-control (both objective and subjective assessment), few other risk factors, and identifiable protective factors, including available and accessible social support.   Follow-up Information     [facility] Follow up on 02/13/2024.   Why: You have a hospital follow up appointment for therapy and medication management services on 02/13/24 at 8:30 am.  The appointment will be Virtual, telehealth. Contact information: [address]  [address] [place] [address] [phone]         [facility] Follow up.   Specialty: Addiction Medicine Why: Referral made Contact information: [address] [place] [address] [phone]                 Plan Of Care/Follow-up recommendations:  See D/C summary  Lamar Sherlean Jama Chandra, DO 02/11/2024,  7:00 AM

## 2024-02-11 NOTE — Progress Notes (Signed)
   02/11/24 0000  Psych Admission Type (Psych Patients Only)  Admission Status Voluntary  Psychosocial Assessment  Patient Complaints Anxiety  Eye Contact Fair  Facial Expression Animated;Anxious  Affect Anxious;Appropriate to circumstance  Speech Logical/coherent  Interaction Assertive  Motor Activity Fidgety  Appearance/Hygiene Unremarkable  Behavior Characteristics Anxious  Mood Anxious;Pleasant  Thought Process  Coherency WDL  Content WDL  Delusions None reported or observed  Perception WDL  Hallucination None reported or observed  Judgment WDL  Confusion None  Danger to Self  Current suicidal ideation? Denies  Agreement Not to Harm Self Yes  Description of Agreement Verbal  Danger to Others  Danger to Others None reported or observed

## 2024-02-11 NOTE — Group Note (Signed)
 Date:  02/11/2024 Time:  9:29 AM  Group Topic/Focus:  Goals Group:   The focus of this group is to help patients establish daily goals to achieve during treatment and discuss how the patient can incorporate goal setting into their daily lives to aide in recovery. Orientation:   The focus of this group is to educate the patient on the purpose and policies of crisis stabilization and provide a format to answer questions about their admission.  The group details unit policies and expectations of patients while admitted.    Participation Level:  Active  Participation Quality:  Appropriate  Affect:  Appropriate  Cognitive:  Appropriate  Insight: Appropriate  Engagement in Group:  Engaged  Modes of Intervention:  Discussion and Orientation  Additional Comments:    Chana Lindstrom D Ayuub Penley 02/11/2024, 9:29 AM

## 2024-02-11 NOTE — Group Note (Signed)
 Date:  02/11/2024 Time:  11:28 AM  Group Topic/Focus:  Pet Therapy:   This group aims to reduce stress and anxiety, improve mood, and increase feelings of comfort using animals.   Participation Level:  Patient did attend group    Alexzavier Girardin D Basheer Molchan 02/11/2024, 11:28 AM

## 2024-02-11 NOTE — Progress Notes (Signed)
  [facility] Adult Case Management Discharge Plan :  Will you be returning to the same living situation after discharge:  No. Patient will be discharged to [facility] substance use facility.  At discharge, do you have transportation home?: No. CSW arranged taxi voucher through [employer] for 11 am. Do you have the ability to pay for your medications: Yes,  patient has active health insurance + income.   Release of information consent forms completed and in the chart;  Patient's signature needed at discharge.  Patient to Follow up at:  Follow-up Information     [facility] Follow up on 02/13/2024.   Why: You have a hospital follow up appointment for therapy and medication management services on 02/13/24 at 8:30 am.  The appointment will be Virtual, telehealth. Contact information: [address]  [address] [place] [address] [phone]         [facility] Follow up.   Specialty: Addiction Medicine Why: Referral made Contact information: [address] [place] [address] [phone]                 Next level of care provider has access to [facility] Link:no  Safety Planning and Suicide Prevention discussed: Yes,  completed with Burnard Right (significant other) [phone]      Has patient been referred to the Quitline?: Patient refused referral for treatment  Patient has been referred for addiction treatment: Yes, the patient will follow up with an outpatient provider for substance use disorder. Psychiatrist/APP: appointment made. Patient will be discharging to substance use facility ([facility]).   [name], LCSWA 02/11/2024, 9:00 AM

## 2024-02-11 NOTE — Progress Notes (Signed)
(  Sleep Hours) - 8.75 (Any PRNs that were needed, meds refused, or side effects to meds)- n/a  (Any disturbances and when (visitation, over night)- n/a  (Concerns raised by the patient)- n/a  (SI/HI/AVH)- denies

## 2024-02-11 NOTE — Progress Notes (Signed)
 D: Pt A & O X 4. Denies SI, HI, AVH and pain at this time. D/C home as ordered. Taxi voucher given for transportation to [facility]. A: D/C instructions reviewed with pt including prescriptions, medication samples and follow up appointment; compliance encouraged. All belongings from assigned locker returned to pt at time of departure. Scheduled medications administered with verbal education and effects monitored. Safety checks maintained without incident till time of d/c.  R: Pt receptive to care. Compliant with medications when offered. Denies adverse drug reactions when assessed. Verbalized understanding related to d/c instructions. Signed belonging sheet in agreement with items received from locker. Ambulatory with a steady gait. Appears to be in no physical distress at time of departure.

## 2024-02-11 NOTE — Discharge Summary (Signed)
 Physician Discharge Summary Note  Patient:  Vernon Allen is an 43 y.o., male MRN:  [id number] DOB:  June 27, 1980 Patient phone:  [phone] (home)  Patient address:   [address] [place] [address],  Total Time spent with patient: 30 minutes  Date of Admission:  01/30/2024 Date of Discharge: 02/11/2024  HPI from Admission:    History of Present Illness: 43 YO M with a history of anxiety, bipolar and depression and alcohol dependence who presented to the [facility] seeking detox and treatment for his mood disorder following 3 months of medication non-compliance and increase in drinking behaviors.              The patient was seen this morning in treatment team and again later in the afternoon. He explains that he has been drinking for years, but a few months ago he increased his intake to 3-4 boot leggers + 7-8 beers due to poor sleep. He has also been having a couple of absences from work, and now his coworkers are making accusations and engaging in bullying behavior. Because of this, he has been ashamed of who I was, so I just chose to lay around my house and drink. He then started missing his son's football games and he feels very guilty, hopeless and like he is a bad father. His self esteem is very low. He has chronic pain. He is unable to quit drinking on his own and has a history of having withdrawal symptoms (but not seizures). He is not currently feeling suicidal. He is not having hallucinations or delusions. He blames himself and states several times I just don't like myself anymore.    Associated Signs/Symptoms: Depression Symptoms:  depressed mood, insomnia, fatigue, feelings of worthlessness/guilt, difficulty concentrating, hopelessness, anxiety, loss of energy/fatigue, decreased appetite, (Hypo) Manic Symptoms:  NA Anxiety Symptoms:  Excessive Worry, Social Anxiety, Psychotic Symptoms:  NA PTSD Symptoms: Negative Total Time spent with patient: 1  hour      Principal Problem: Bipolar I disorder, most recent episode depressed (HCC) Discharge Diagnoses: Principal Problem:   Bipolar I disorder, most recent episode depressed (HCC) Active Problems:   Substance induced mood disorder (HCC)   Alcohol use disorder   Tobacco use disorder   Increased ammonia level   Vitamin D deficiency   Past Psychiatric History: has been to [facility] and [facility] in the past. Previously tried on topamax  and trazodone . Had Ritalin as a child and did not tolerate it. Remote suicide attempt in teens. No current outpatient provider.  Past Medical History:  Past Medical History:  Diagnosis Date   Alcohol abuse    Allergy to alpha-gal    Anxiety    Avascular necrosis of bone of right hip (HCC) 02/2023   Bipolar 1 disorder (HCC)    Elevated blood pressure reading with diagnosis of hypertension    Perforated appendicitis 10/09/2018   Perirectal abscess 05/19/2021    Past Surgical History:  Procedure Laterality Date   BACK SURGERY  2008   L4-5 ruptured disc   INCISION AND DRAINAGE PERIRECTAL ABSCESS Left 05/19/2021   LAPAROSCOPIC APPENDECTOMY N/A 10/09/2018   Procedure: APPENDECTOMY LAPAROSCOPIC ATTEMPTED;  Surgeon: Jordis Laneta FALCON, MD;  Location: [facility] OR[facility];  Service: General;  Laterality: N/A;   PARTIAL COLECTOMY Right 10/09/2018   Procedure: PARTIAL COLECTOMY;  Surgeon: Jordis Laneta FALCON, MD;  Location: [facility] OR[facility];  Service: General;  Laterality: Right;   TOTAL HIP ARTHROPLASTY Right 04/11/2023   Procedure: TOTAL HIP ARTHROPLASTY;  Surgeon: Edie,  Norleen PARAS, MD;  Location: [facility] OR[facility];  Service: Orthopedics;  Laterality: Right;   Family History:  Family History  Adopted: Yes   Family Psychiatric  History:   ADOPTED  Social History:  Social History   Substance and Sexual Activity  Alcohol Use Yes   Alcohol/week: 12.0 standard drinks of alcohol   Types: 12 Cans of beer per week   Comment: 6 pack daily     Social History   Substance and Sexual Activity   Drug Use Not Currently   Types: Marijuana   Comment: occassional    Social History   Socioeconomic History   Marital status: Divorced    Spouse name: Not on file   Number of children: 2   Years of education: Not on file   Highest education level: Not on file  Occupational History   Not on file  Tobacco Use   Smoking status: Some Days    Current packs/day: 0.25    Types: Cigarettes   Smokeless tobacco: Never  Vaping Use   Vaping status: Never Used  Substance and Sexual Activity   Alcohol use: Yes    Alcohol/week: 12.0 standard drinks of alcohol    Types: 12 Cans of beer per week    Comment: 6 pack daily   Drug use: Not Currently    Types: Marijuana    Comment: occassional   Sexual activity: Not on file  Other Topics Concern   Not on file  Social History Narrative   Lives with sons and fiance   Social Drivers of Health   Financial Resource Strain: Medium Risk (05/24/2023)   [facility]   Overall Financial Resource Strain (CARDIA)    Difficulty of Paying Living Expenses: Somewhat hard  Food Insecurity: No Food Insecurity (01/30/2024)   Hunger Vital Sign    Worried About Running Out of Food in the Last Year: Never true    Ran Out of Food in the Last Year: Never true  Transportation Needs: No Transportation Needs (01/30/2024)   PRAPARE - Administrator, Civil Service (Medical): No    Lack of Transportation (Non-Medical): No  Physical Activity: Not on file  Stress: Not on file  Social Connections: Not on file    Hospital Course:   During the patient's hospitalization, patient had extensive initial psychiatric evaluation, and follow-up psychiatric evaluations every day.  Psychiatric diagnoses provided upon initial assessment:  Principal Problem:   Bipolar I disorder, most recent episode depressed (HCC) Active Problems:   Substance induced mood disorder (HCC)   Alcohol use disorder   Tobacco use disorder   Increased  ammonia level   Vitamin D deficiency   During the hospitalization, other adjustments were made to the patient's psychiatric medication regimen:    Seroquel 2mg  BID and 300mg  at night  Trazodone  50mg  PRN  Gabapentin  400mg  tid  Melatonin 5mg  at night  Folic Acid, Vitamin D and Vitamin B  Hydralazine  25mg  tid  Patient's care was discussed during the interdisciplinary team meeting every day during the hospitalization.  The patient denied having side effects to prescribed psychiatric medication.  Gradually, patient started adjusting to milieu. The patient was evaluated each day by a clinical provider to ascertain response to treatment. Improvement was noted by the patient's report of decreasing symptoms, improved sleep and appetite, affect, medication tolerance, behavior, and participation in unit programming.  Patient was asked each day to complete a self inventory noting mood, mental status, pain, new symptoms, anxiety and  concerns.   Symptoms were reported as significantly decreased or resolved completely by discharge.  The patient reports that their mood is stable.  The patient denied having suicidal thoughts for more than 48 hours prior to discharge.  Patient denies having homicidal thoughts.  Patient denies having auditory hallucinations.  Patient denies any visual hallucinations or other symptoms of psychosis.  The patient was motivated to continue taking medication with a goal of continued improvement in mental health.   The patient reports their target psychiatric symptoms of depression, anxiety and alcohol cravings responded well to the psychiatric medications, and the patient reports overall benefit other psychiatric hospitalization. Supportive psychotherapy was provided to the patient. The patient also participated in regular group therapy while hospitalized. Coping skills, problem solving as well as relaxation therapies were also part of the unit programming.  Labs were reviewed with  the patient, and abnormal results were discussed with the patient.  The patient is able to verbalize their individual safety plan to this provider.  # It is recommended to the patient to continue psychiatric medications as prescribed, after discharge from the hospital.    # It is recommended to the patient to follow up with your outpatient psychiatric provider and PCP.  # It was discussed with the patient, the impact of alcohol, drugs, tobacco have been there overall psychiatric and medical wellbeing, and total abstinence from substance use was recommended the patient.ed.  # Prescriptions provided or sent directly to preferred pharmacy at discharge. Patient agreeable to plan. Given opportunity to ask questions. Appears to feel comfortable with discharge.    # In the event of worsening symptoms, the patient is instructed to call the crisis hotline, 911 and or go to the nearest ED for appropriate evaluation and treatment of symptoms. To follow-up with primary care provider for other medical issues, concerns and or health care needs  # Patient was discharged to [facility] residential with a plan to follow up as noted below.   The patient continued his home Seroquel and was treated for bipolar depression given his history of mania.  Seroquel was titrated to 300 mg at night and 25 mg twice daily.  Additionally, he was given gabapentin  400 mg 3 times daily, melatonin and a vitamin regimen.  On day of discharge the patient was calm, friendly and interacted well with me.  He reports being both excited and nervous about going to the inpatient/residential substance abuse program.  Will be attending [facility] for the next 28 days for alcohol abuse.  The patient states that he is doing this for both himself and his family.  The patient states that he is in a good place to make this substance abuse program successful due to being in a healthier financial place.  The patient has many goals which he discusses with me,  overall is to become sober and improve his family's life.  The patient denies any physiological symptoms of withdrawal and states that his psychological symptoms of withdrawal are only fleeting.  Otherwise, denies SI, HI and AVH.  He is hopeful about the future.  Review of systems is negative and reports that he is tolerating his medication regimen well.  He reports his mood has much improved since the admission.  The patient reports having firearms in the home but states that they are locked up.  We discussed the safety plan to include actions if suicidal ideations develop.  We also discussed his medications and follow-up plan.  The patient will be given 14-day supply  of his core psychiatric medications for [facility].  The patient will be discharged in stable condition.  Physical Findings: AIMS:  , ,  ,  ,  ,  ,   CIWA:  CIWA-Ar Total: 1 COWS:     Musculoskeletal: Strength & Muscle Tone: within normal limits Gait & Station: normal Patient leans: N/A   Psychiatric Specialty Exam:  Presentation  General Appearance:  Appropriate for Environment  Eye Contact: Good  Speech: Clear and Coherent  Speech Volume: Normal  Handedness: Right   Mood and Affect  Mood: Euthymic  Affect: Appropriate   Thought Process  Thought Processes: Coherent  Descriptions of Associations:Intact  Orientation:Full (Time, Place and Person)  Thought Content:Abstract Reasoning; Logical  History of Schizophrenia/Schizoaffective disorder:No  Duration of Psychotic Symptoms:Greater than six months  Hallucinations:Hallucinations: None  Ideas of Reference:None  Suicidal Thoughts:Suicidal Thoughts: No  Homicidal Thoughts:Homicidal Thoughts: No   Sensorium  Memory: Immediate Good; Remote Good; Recent Good  Judgment: Good  Insight: Good   Executive Functions  Concentration: Good  Attention Span: Good  Recall: Good  Fund of Knowledge: Good  Language: Good   Psychomotor  Activity  Psychomotor Activity: Psychomotor Activity: Normal   Assets  Assets: Communication Skills; Financial Resources/Insurance; Desire for Improvement; Housing; Vocational/Educational   Sleep  Sleep: Sleep: Good  Estimated Sleeping Duration (Last 24 Hours): 8.50-9.25 hours   Physical Exam: Physical Exam ROS Blood pressure 111/83, pulse 94, temperature (!) 97.5 F (36.4 C), temperature source Oral, resp. rate 16, height 5' 9 (1.753 m), weight 104.4 kg, SpO2 97%. Body mass index is 33.99 kg/m.   Social History   Tobacco Use  Smoking Status Some Days   Current packs/day: 0.25   Types: Cigarettes  Smokeless Tobacco Never   Tobacco Cessation:  A prescription for an FDA-approved tobacco cessation medication provided at discharge   Blood Alcohol level:  Lab Results  Component Value Date   ETH 35 (H) 01/30/2024   ETH 82 (H) 06/14/2023    Metabolic Disorder Labs:  Lab Results  Component Value Date   HGBA1C 5.3 02/01/2024   MPG 105.41 02/01/2024   MPG 85.32 06/16/2023   No results found for: PROLACTIN Lab Results  Component Value Date   CHOL 434 (H) 02/06/2024   TRIG 537 (H) 02/06/2024   HDL 50 02/06/2024   CHOLHDL 8.6 02/06/2024   VLDL 892 (H) 02/06/2024   LDLCALC UNABLE TO CALCULATE IF TRIGLYCERIDE OVER 400 mg/dL 89/83/7974   LDLCALC UNABLE TO CALCULATE IF TRIGLYCERIDE OVER 400 mg/dL 89/88/7974    See Psychiatric Specialty Exam and Suicide Risk Assessment completed by Attending Physician prior to discharge.  Discharge destination:  [facility]  Is patient on multiple antipsychotic therapies at discharge:  No   Has Patient had three or more failed trials of antipsychotic monotherapy by history:  No  Recommended Plan for Multiple Antipsychotic Therapies: NA  Discharge Instructions     Diet - low sodium heart healthy   Complete by: As directed    Discharge instructions   Complete by: As directed    [facility] residential substance abuse program    Increase activity slowly   Complete by: As directed       Allergies as of 02/11/2024       Reactions   Bovine (beef) Protein-containing Drug Products Anaphylaxis   Keflex [cephalexin] Anaphylaxis   Mobic [meloxicam] Anaphylaxis   Porcine (pork) Protein-containing Drug Products Anaphylaxis   Bc Fast Pain Relief [aspirin-salicylamide-caffeine] Hives   Ciprofloxacin  Hives   Esomeprazole Magnesium  Hives  Medication List     STOP taking these medications    ARIPiprazole  5 MG tablet Commonly known as: ABILIFY    cyclobenzaprine  5 MG tablet Commonly known as: FLEXERIL    DULoxetine  20 MG capsule Commonly known as: CYMBALTA    oxyCODONE  5 MG immediate release tablet Commonly known as: Oxy IR/ROXICODONE    topiramate  50 MG tablet Commonly known as: TOPAMAX        TAKE these medications      Indication  folic acid 1 MG tablet Commonly known as: FOLVITE Take 1 tablet (1 mg total) by mouth daily.  Indication: Anemia From Inadequate Folic Acid   gabapentin  400 MG capsule Commonly known as: NEURONTIN  Take 1 capsule (400 mg total) by mouth 3 (three) times daily for 14 days.  Indication: Abuse or Misuse of Alcohol, Alcohol Withdrawal Syndrome, Generalized Anxiety Disorder, Panic Disorder   hydrALAZINE  25 MG tablet Commonly known as: APRESOLINE  Take 1 tablet (25 mg total) by mouth 3 (three) times daily for 14 days.  Indication: High Blood Pressure   melatonin 5 MG Tabs Take 1 tablet (5 mg total) by mouth at bedtime.  Indication: Trouble Sleeping   multivitamin with minerals Tabs tablet Take 1 tablet by mouth daily. Start taking on: February 12, 2024  Indication: Detoxification and Rehabilitation for Alcohol and Drugs   QUEtiapine 25 MG tablet Commonly known as: SEROQUEL Take 1 tablet (25 mg total) by mouth 2 (two) times daily for 14 days.  Indication: Depressive Phase of Manic-Depression   QUEtiapine 300 MG tablet Commonly known as: SEROQUEL Take 1 tablet  (300 mg total) by mouth at bedtime for 14 days.  Indication: Depressive Phase of Manic-Depression   traZODone  50 MG tablet Commonly known as: DESYREL  Take 1 tablet (50 mg total) by mouth at bedtime.  Indication: Trouble Sleeping   Vitamin D (Ergocalciferol) 1.25 MG (50000 UNIT) Caps capsule Commonly known as: DRISDOL Take 1 capsule (50,000 Units total) by mouth every 7 (seven) days. Start taking on: February 15, 2024  Indication: Vitamin D Deficiency        Follow-up Information     [facility] Go to.   Specialty: Addiction Medicine Why: Please go to [facility] on 02/11/24 at 12:30 pm for your admission intake. Contact information: [address] [place] [address] [phone]                 Follow-up recommendations:  [facility] residential substance absuse program  Comments:  as above  Signed: Lamar Sherlean Jama Chandra, DO 02/11/2024, 12:29 PM

## 2024-03-16 ENCOUNTER — Encounter (HOSPITAL_BASED_OUTPATIENT_CLINIC_OR_DEPARTMENT_OTHER): Payer: Self-pay

## 2024-03-16 ENCOUNTER — Other Ambulatory Visit (HOSPITAL_BASED_OUTPATIENT_CLINIC_OR_DEPARTMENT_OTHER): Payer: Self-pay

## 2024-03-16 ENCOUNTER — Ambulatory Visit (INDEPENDENT_AMBULATORY_CARE_PROVIDER_SITE_OTHER): Payer: Self-pay | Admitting: Student

## 2024-03-16 VITALS — BP 144/85 | HR 82 | Temp 98.2°F | Resp 16 | Ht 68.31 in | Wt 244.3 lb

## 2024-03-16 DIAGNOSIS — Z7689 Persons encountering health services in other specified circumstances: Secondary | ICD-10-CM

## 2024-03-16 DIAGNOSIS — F1021 Alcohol dependence, in remission: Secondary | ICD-10-CM

## 2024-03-16 DIAGNOSIS — F3130 Bipolar disorder, current episode depressed, mild or moderate severity, unspecified: Secondary | ICD-10-CM

## 2024-03-16 DIAGNOSIS — Z6836 Body mass index (BMI) 36.0-36.9, adult: Secondary | ICD-10-CM

## 2024-03-16 DIAGNOSIS — F99 Mental disorder, not otherwise specified: Secondary | ICD-10-CM

## 2024-03-16 DIAGNOSIS — F5105 Insomnia due to other mental disorder: Secondary | ICD-10-CM

## 2024-03-16 MED ORDER — QUETIAPINE FUMARATE 300 MG PO TABS
300.0000 mg | ORAL_TABLET | Freq: Every day | ORAL | 0 refills | Status: AC
  Filled 2024-03-16: qty 30, 30d supply, fill #0

## 2024-03-16 MED ORDER — TRAZODONE HCL 50 MG PO TABS
50.0000 mg | ORAL_TABLET | Freq: Every day | ORAL | 0 refills | Status: DC
  Filled 2024-03-16: qty 30, 30d supply, fill #0

## 2024-03-16 MED ORDER — QUETIAPINE FUMARATE 25 MG PO TABS
25.0000 mg | ORAL_TABLET | Freq: Two times a day (BID) | ORAL | 0 refills | Status: AC
  Filled 2024-03-16: qty 60, 30d supply, fill #0

## 2024-03-16 NOTE — Patient Instructions (Addendum)
 It was nice to see you today!  As we discussed in clinic:  [facility] - [facility] Address: [address], [place], [place] [address] Phone: [phone]  [facility] - [facility] Address: [address] [place], [place] [address] Phone: [phone]  If you have any problems before your next visit feel free to message me via MyChart (minor issues or questions) or call the office, otherwise you may reach out to schedule an office visit.  Thank you! Quamaine Webb, PA-C

## 2024-03-16 NOTE — Progress Notes (Signed)
 New Patient Office Visit  Subjective    Patient ID: Vernon Allen, male    DOB: 12/30/1980  Age: 43 y.o. MRN: [id number]  CC:  Chief Complaint  Patient presents with   Establish Care    Here to establish care.    Medical Clearance    Pt needs a clearance to go back to work. Was on short term leave and had a 28 day alcohol program. Cannot hold job. Has bipolar disorder. Is trying to take care of his family. Work got mad because this last time he was getting burn out and he checked himself into a hospital. He was taking something for cravings but does not have an rx. Did have a beer today out of frustration.    Discussed the use of AI scribe software for clinical note transcription with the patient, who gave verbal consent to proceed.  History of Present Illness   Vernon Allen is a 43 year old male who presents for establishment of care,  He completed a 28-day treatment program for alcohol use disorder at [facility], which ended on the 19th of the previous month. His employer requires additional documentation to confirm his ability to return to work without restrictions. He is frustrated with the process, noting that his paperwork from [facility] was deemed insufficient by his employer's HR department.  He has a history of being in and out of mental health facilities and institutions, with past involuntary commitments due to alcohol use and medication non-compliance. 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  He is currently taking Seroquel  300 mg and 25 mg, and trazodone  for sleep. He feels that Seroquel  makes him feel like a 'suppressed version' of himself and has contributed to weight gain. He is concerned about the impact of these medications on his ability to function at work.  He has a history of bipolar disorder and has struggled with maintaining  employment, not holding a job for more than two years over the past 13-14 years. He has been off medications since he was [age] years old to meet military requirements, which he joined at 78 with parental consent.  He experiences back pain, which has worsened since his hip replacement, and notes that his job requires climbing on tractor trailers, contributing to his discomfort. No current suicidal ideation, but he feels 'burnt out' from work.      Outpatient Encounter Medications as of 03/16/2024  Medication Sig   gabapentin  (NEURONTIN ) 400 MG capsule Take 1 capsule (400 mg total) by mouth 3 (three) times daily for 14 days.   melatonin 5 MG TABS Take 1 tablet (5 mg total) by mouth at bedtime. (Patient taking differently: Take 5 mg by mouth as needed.)   Multiple Vitamin (MULTIVITAMIN WITH MINERALS) TABS tablet Take 1 tablet by mouth daily. (Patient taking differently: Take 1 tablet by mouth as needed.)   [DISCONTINUED] QUEtiapine  (SEROQUEL ) 25 MG tablet Take 1 tablet (25 mg total) by mouth 2 (two) times daily for 14 days.   [DISCONTINUED] QUEtiapine  (SEROQUEL ) 300 MG tablet Take 1 tablet (300 mg total) by mouth at bedtime for 14 days.   [DISCONTINUED] traZODone  (DESYREL ) 50 MG tablet Take 1 tablet (50 mg total) by mouth at bedtime.   folic acid  (FOLVITE ) 1 MG tablet Take 1 tablet (1 mg total) by mouth daily. (Patient not taking: Reported on 03/16/2024)   hydrALAZINE  (APRESOLINE )  25 MG tablet Take 1 tablet (25 mg total) by mouth 3 (three) times daily for 14 days. (Patient not taking: Reported on 03/16/2024)   QUEtiapine  (SEROQUEL ) 25 MG tablet Take 1 tablet (25 mg total) by mouth 2 (two) times daily.   QUEtiapine  (SEROQUEL ) 300 MG tablet Take 1 tablet (300 mg total) by mouth at bedtime.   traZODone  (DESYREL ) 50 MG tablet Take 1 tablet (50 mg total) by mouth at bedtime.   Vitamin D , Ergocalciferol , (DRISDOL ) 1.25 MG (50000 UNIT) CAPS capsule Take 1 capsule (50,000 Units total) by mouth every 7 (seven)  days. (Patient not taking: Reported on 03/16/2024)   No facility-administered encounter medications on file as of 03/16/2024.    Past Medical History:  Diagnosis Date   Alcohol abuse    Allergy    Allergy to alpha-gal    Anxiety    Avascular necrosis of bone of right hip (HCC) 02/2023   Bipolar 1 disorder (HCC)    Depression    Elevated blood pressure reading with diagnosis of hypertension    Perforated appendicitis 10/09/2018   Perirectal abscess 05/19/2021    Past Surgical History:  Procedure Laterality Date   BACK SURGERY  2008   L4-5 ruptured disc   INCISION AND DRAINAGE PERIRECTAL ABSCESS Left 05/19/2021   LAPAROSCOPIC APPENDECTOMY N/A 10/09/2018   Procedure: APPENDECTOMY LAPAROSCOPIC ATTEMPTED;  Surgeon: Jordis Laneta FALCON, MD;  Location: [facility] ORS;  Service: General;  Laterality: N/A;   PARTIAL COLECTOMY Right 10/09/2018   Procedure: PARTIAL COLECTOMY;  Surgeon: Jordis Laneta FALCON, MD;  Location: [facility] ORS;  Service: General;  Laterality: Right;   TOTAL HIP ARTHROPLASTY Right 04/11/2023   Procedure: TOTAL HIP ARTHROPLASTY;  Surgeon: Edie Norleen PARAS, MD;  Location: [facility] ORS;  Service: Orthopedics;  Laterality: Right;    Family History  Adopted: Yes  Problem Relation Age of Onset   Heart disease Father     Social History   Socioeconomic History   Marital status: Legally Separated    Spouse name: Not on file   Number of children: 2   Years of education: Not on file   Highest education level: Never attended school  Occupational History   Not on file  Tobacco Use   Smoking status: Some Days    Current packs/day: 0.25    Average packs/day: 0.3 packs/day for 30.9 years (7.7 ttl pk-yrs)    Types: Cigarettes    Start date: 1995    Passive exposure: Current   Smokeless tobacco: Never  Vaping Use   Vaping status: Never Used  Substance and Sexual Activity   Alcohol use: Not Currently    Comment: TRYING TO QUIT. HAD SOME BEER TODAY 03/16/2024   Drug use: Not Currently     Types: Marijuana    Comment: occassional   Sexual activity: Yes    Birth control/protection: None  Other Topics Concern   Not on file  Social History Narrative   2 BOYS    Social Drivers of Health   Financial Resource Strain: Medium Risk (03/16/2024)   Overall Financial Resource Strain (CARDIA)    Difficulty of Paying Living Expenses: Somewhat hard  Food Insecurity: No Food Insecurity (03/16/2024)   Hunger Vital Sign    Worried About Running Out of Food in the Last Year: Never true    Ran Out of Food in the Last Year: Never true  Transportation Needs: No Transportation Needs (03/16/2024)   PRAPARE - Transportation    Lack of Transportation (Medical): No    Lack  of Transportation (Non-Medical): No  Physical Activity: Inactive (03/16/2024)   Exercise Vital Sign    Days of Exercise per Week: 5 days    Minutes of Exercise per Session: 0 min  Stress: Stress Concern Present (03/16/2024)   [employer] of Occupational Health - Occupational Stress Questionnaire    Feeling of Stress: Very much  Social Connections: Moderately Isolated (03/16/2024)   Social Connection and Isolation Panel    Frequency of Communication with Friends and Family: More than three times a week    Frequency of Social Gatherings with Friends and Family: Once a week    Attends Religious Services: 1 to 4 times per year    Active Member of [employer] or Organizations: No    Attends Banker Meetings: Not on file    Marital Status: Separated  Intimate Partner Violence: Not At Risk (03/16/2024)   Humiliation, Afraid, Rape, and Kick questionnaire    Fear of Current or Ex-Partner: No    Emotionally Abused: No    Physically Abused: No    Sexually Abused: No    ROS  Per HPI      Objective    BP (!) 144/85   Pulse 82   Temp 98.2 F (36.8 C) (Oral)   Resp 16   Ht 5' 8.31 (1.735 m)   Wt 244 lb 4.8 oz (110.8 kg)   SpO2 96%   BMI 36.81 kg/m   Physical Exam Constitutional:      General:  He is not in acute distress.    Appearance: Normal appearance. He is not ill-appearing.  HENT:     Head: Normocephalic and atraumatic.     Right Ear: External ear normal.     Left Ear: External ear normal.     Nose: Nose normal.  Eyes:     Conjunctiva/sclera: Conjunctivae normal.  Cardiovascular:     Rate and Rhythm: Normal rate and regular rhythm.     Pulses: Normal pulses.     Heart sounds: Normal heart sounds. No murmur heard.    No friction rub.  Pulmonary:     Effort: Pulmonary effort is normal. No respiratory distress.     Breath sounds: Normal breath sounds. No wheezing, rhonchi or rales.  Musculoskeletal:     Right lower leg: No edema.     Left lower leg: No edema.  Skin:    General: Skin is warm and dry.     Coloration: Skin is not jaundiced or pale.  Neurological:     Mental Status: He is alert.         Assessment & Plan:   Assessment and Plan    Establishment of Care  Bipolar disorder, current episode depressed Bipolar disorder with a history of multiple hospitalizations and inconsistent medication adherence. Currently experiencing depressive symptoms and dissatisfaction with current medication regimen, particularly Seroquel , due to side effects such as feeling suppressed and weight gain. He expresses a desire to adjust medications to better manage symptoms and improve quality of life. - Continue seroquel - refill x30 days - Referred to psychiatry for comprehensive evaluation and medication management. - Provided a note indicating the need for psychiatric evaluation before returning to work. - Continue current medications until psychiatric evaluation.  Insomnia due to mental disorder Insomnia likely related to underlying mental health issues, including bipolar disorder. Currently taking trazodone  for insomnia. - Continue trazodone  for insomnia management. - refill x 30 days  Alcohol dependence, in remission Alcohol dependence currently in remission. Recent  stressors related to  work and insurance issues led to a brief lapse in sobriety. He is seeking to maintain sobriety and is aware of the need for ongoing support. - Encouraged continued abstinence from alcohol.  Elevated blood pressure Blood pressure is currently elevated, though previous readings were lower. Requires monitoring to determine if hypertension is present. - Rechecked blood pressure at the end of the visit. - If blood pressure remains elevated, will initiate home blood pressure monitoring.      Return in about 6 weeks (around 04/27/2024) for Chronic Followup.   Yates Weisgerber T Giorgi Debruin, PA-C

## 2024-04-10 ENCOUNTER — Telehealth (HOSPITAL_BASED_OUTPATIENT_CLINIC_OR_DEPARTMENT_OTHER): Payer: Self-pay

## 2024-04-10 ENCOUNTER — Other Ambulatory Visit (HOSPITAL_COMMUNITY): Payer: Self-pay

## 2024-04-10 NOTE — Telephone Encounter (Signed)
 Patient came by and stated he went to see Hassel that he was referred to (he saw aLationa) at [phone] and he stated that she told him she saw no reason he could not return to work however a note had to be given by his PCP. He wants to know if a not can be written that he can return to work with no restriction and that he be called to pick up or put into my chart or emailed to him. He would appreciate it. [phone] or email at [email or web address]

## 2024-04-14 ENCOUNTER — Telehealth (HOSPITAL_BASED_OUTPATIENT_CLINIC_OR_DEPARTMENT_OTHER): Payer: Self-pay | Admitting: Student

## 2024-04-14 ENCOUNTER — Telehealth (HOSPITAL_BASED_OUTPATIENT_CLINIC_OR_DEPARTMENT_OTHER): Payer: Self-pay

## 2024-04-14 ENCOUNTER — Encounter (HOSPITAL_BASED_OUTPATIENT_CLINIC_OR_DEPARTMENT_OTHER): Payer: Self-pay | Admitting: Student

## 2024-04-14 ENCOUNTER — Encounter (HOSPITAL_BASED_OUTPATIENT_CLINIC_OR_DEPARTMENT_OTHER): Payer: Self-pay

## 2024-04-14 NOTE — Telephone Encounter (Signed)
 Copied from CRM #[id number]. Topic: Medical Record Request - Other >> Apr 14, 2024  3:12 PM Darshell M wrote: Reason for CRM: Patient advised he signed release at [facility] so records are okay to release. He checked with [facility] to confirm they have the release form. Patient CB# [phone]   Please fax records once we receive release.  See note 12/19

## 2024-04-14 NOTE — Telephone Encounter (Signed)
 Called patient and unable to leave a message. Left patient a my chart message to either go to [facility] and sign a records release to [facility] or come in to the office to sign a release in order for us  to get your records from [facility]

## 2024-04-14 NOTE — Telephone Encounter (Signed)
 Per PCP, we need records from [facility] in [place]. Pt agreed to come Friday to sign a release form.

## 2024-04-14 NOTE — Telephone Encounter (Signed)
 Copied from CRM [id number]. Topic: Appointments - Appointment Info/Confirmation >> Mar 16, 2024 12:24 PM Vernon Allen wrote: Patient/patient representative is calling for information regarding an appointment.   Mar 16 2024 04:10 PM - New Office Visit [facility] Lang T Rothfuss, PA-C

## 2024-04-17 ENCOUNTER — Telehealth (HOSPITAL_BASED_OUTPATIENT_CLINIC_OR_DEPARTMENT_OTHER): Payer: Self-pay

## 2024-04-17 ENCOUNTER — Telehealth (HOSPITAL_BASED_OUTPATIENT_CLINIC_OR_DEPARTMENT_OTHER): Payer: Self-pay | Admitting: Student

## 2024-04-17 NOTE — Telephone Encounter (Signed)
 Communication  Reason for CRM: Patient would like to know if provider can write a doctor's note to return to work w/ no restrictions. Would like to know if it could be emailed as well.

## 2024-04-17 NOTE — Telephone Encounter (Signed)
 Copied from CRM #[id number]. Topic: Medical Record Request - Other >> Apr 17, 2024  2:04 PM Vernon Allen wrote: Pt is following up to check if office has received these release forms for medical paperwork. Please advise on #[id number]

## 2024-04-17 NOTE — Telephone Encounter (Signed)
 Patient came into the office to sign a medical release this morning. We have not received the records yet

## 2024-04-20 ENCOUNTER — Encounter (HOSPITAL_BASED_OUTPATIENT_CLINIC_OR_DEPARTMENT_OTHER): Payer: Self-pay

## 2024-04-20 ENCOUNTER — Encounter (HOSPITAL_BASED_OUTPATIENT_CLINIC_OR_DEPARTMENT_OTHER): Payer: Self-pay | Admitting: Student

## 2024-04-20 ENCOUNTER — Other Ambulatory Visit (HOSPITAL_BASED_OUTPATIENT_CLINIC_OR_DEPARTMENT_OTHER): Payer: Self-pay

## 2024-04-20 DIAGNOSIS — E7800 Pure hypercholesterolemia, unspecified: Secondary | ICD-10-CM

## 2024-04-20 DIAGNOSIS — E559 Vitamin D deficiency, unspecified: Secondary | ICD-10-CM

## 2024-04-20 DIAGNOSIS — R748 Abnormal levels of other serum enzymes: Secondary | ICD-10-CM

## 2024-04-27 ENCOUNTER — Other Ambulatory Visit (HOSPITAL_BASED_OUTPATIENT_CLINIC_OR_DEPARTMENT_OTHER): Payer: Self-pay

## 2024-04-27 ENCOUNTER — Ambulatory Visit (HOSPITAL_BASED_OUTPATIENT_CLINIC_OR_DEPARTMENT_OTHER): Payer: Self-pay | Admitting: Student

## 2024-04-27 DIAGNOSIS — R748 Abnormal levels of other serum enzymes: Secondary | ICD-10-CM

## 2024-04-27 DIAGNOSIS — E559 Vitamin D deficiency, unspecified: Secondary | ICD-10-CM

## 2024-04-27 DIAGNOSIS — E7800 Pure hypercholesterolemia, unspecified: Secondary | ICD-10-CM

## 2024-05-20 ENCOUNTER — Other Ambulatory Visit: Payer: Self-pay

## 2024-05-20 ENCOUNTER — Other Ambulatory Visit (HOSPITAL_BASED_OUTPATIENT_CLINIC_OR_DEPARTMENT_OTHER): Payer: Self-pay | Admitting: Student

## 2024-05-20 DIAGNOSIS — F99 Mental disorder, not otherwise specified: Secondary | ICD-10-CM

## 2024-05-20 MED ORDER — TRAZODONE HCL 50 MG PO TABS
50.0000 mg | ORAL_TABLET | Freq: Every day | ORAL | 0 refills | Status: AC
  Filled 2024-05-20: qty 90, 90d supply, fill #0

## 2024-05-21 ENCOUNTER — Other Ambulatory Visit: Payer: Self-pay

## 2024-09-11 ENCOUNTER — Ambulatory Visit (HOSPITAL_BASED_OUTPATIENT_CLINIC_OR_DEPARTMENT_OTHER): Payer: Self-pay | Admitting: Student
